# Patient Record
Sex: Male | Born: 1955 | Race: White | Hispanic: No | Marital: Married | Smoking: Former smoker
Health system: Southern US, Community
[De-identification: ages and names within clinical notes are randomized; demographics above are authoritative.]

## PROBLEM LIST (undated history)

## (undated) DIAGNOSIS — G4730 Sleep apnea, unspecified: Secondary | ICD-10-CM

## (undated) DIAGNOSIS — Z720 Tobacco use: Secondary | ICD-10-CM

## (undated) DIAGNOSIS — J61 Pneumoconiosis due to asbestos and other mineral fibers: Secondary | ICD-10-CM

## (undated) DIAGNOSIS — F419 Anxiety disorder, unspecified: Secondary | ICD-10-CM

## (undated) DIAGNOSIS — E785 Hyperlipidemia, unspecified: Secondary | ICD-10-CM

## (undated) DIAGNOSIS — M1990 Unspecified osteoarthritis, unspecified site: Secondary | ICD-10-CM

## (undated) DIAGNOSIS — K219 Gastro-esophageal reflux disease without esophagitis: Secondary | ICD-10-CM

## (undated) DIAGNOSIS — J45909 Unspecified asthma, uncomplicated: Secondary | ICD-10-CM

## (undated) DIAGNOSIS — E119 Type 2 diabetes mellitus without complications: Secondary | ICD-10-CM

## (undated) DIAGNOSIS — T7840XA Allergy, unspecified, initial encounter: Secondary | ICD-10-CM

## (undated) DIAGNOSIS — R200 Anesthesia of skin: Secondary | ICD-10-CM

## (undated) HISTORY — DX: Sleep apnea, unspecified: G47.30

## (undated) HISTORY — PX: VASECTOMY: SHX75

## (undated) HISTORY — DX: Pneumoconiosis due to asbestos and other mineral fibers: J61

## (undated) HISTORY — DX: Tobacco use: Z72.0

## (undated) HISTORY — PX: FOOT MASS EXCISION: SHX1663

## (undated) HISTORY — DX: Type 2 diabetes mellitus without complications: E11.9

## (undated) HISTORY — DX: Gastro-esophageal reflux disease without esophagitis: K21.9

## (undated) HISTORY — PX: SPINE SURGERY: SHX786

## (undated) HISTORY — DX: Allergy, unspecified, initial encounter: T78.40XA

## (undated) HISTORY — DX: Anxiety disorder, unspecified: F41.9

## (undated) HISTORY — DX: Hyperlipidemia, unspecified: E78.5

## (undated) HISTORY — DX: Unspecified asthma, uncomplicated: J45.909

## (undated) HISTORY — PX: EYE SURGERY: SHX253

---

## 2000-04-26 ENCOUNTER — Ambulatory Visit (HOSPITAL_BASED_OUTPATIENT_CLINIC_OR_DEPARTMENT_OTHER): Admission: RE | Admit: 2000-04-26 | Discharge: 2000-04-26 | Payer: Self-pay | Admitting: Family Medicine

## 2002-11-20 ENCOUNTER — Ambulatory Visit (HOSPITAL_COMMUNITY): Admission: RE | Admit: 2002-11-20 | Discharge: 2002-11-20 | Payer: Self-pay | Admitting: Family Medicine

## 2002-11-20 ENCOUNTER — Encounter: Payer: Self-pay | Admitting: Family Medicine

## 2004-10-11 ENCOUNTER — Emergency Department (HOSPITAL_COMMUNITY): Admission: EM | Admit: 2004-10-11 | Discharge: 2004-10-11 | Payer: Self-pay | Admitting: Emergency Medicine

## 2007-12-09 ENCOUNTER — Emergency Department (HOSPITAL_COMMUNITY): Admission: EM | Admit: 2007-12-09 | Discharge: 2007-12-09 | Payer: Self-pay | Admitting: Emergency Medicine

## 2010-12-11 LAB — URINALYSIS, ROUTINE W REFLEX MICROSCOPIC
Glucose, UA: NEGATIVE
Nitrite: NEGATIVE
pH: 5.5

## 2010-12-11 LAB — POCT I-STAT, CHEM 8
Glucose, Bld: 98
HCT: 46

## 2012-05-30 ENCOUNTER — Other Ambulatory Visit (INDEPENDENT_AMBULATORY_CARE_PROVIDER_SITE_OTHER): Payer: BC Managed Care – PPO

## 2012-05-30 ENCOUNTER — Other Ambulatory Visit: Payer: Self-pay | Admitting: Physician Assistant

## 2012-05-30 DIAGNOSIS — E1059 Type 1 diabetes mellitus with other circulatory complications: Secondary | ICD-10-CM

## 2012-05-30 DIAGNOSIS — R5381 Other malaise: Secondary | ICD-10-CM

## 2012-05-30 DIAGNOSIS — E559 Vitamin D deficiency, unspecified: Secondary | ICD-10-CM

## 2012-05-30 DIAGNOSIS — R5383 Other fatigue: Secondary | ICD-10-CM

## 2012-05-30 LAB — POCT GLYCOSYLATED HEMOGLOBIN (HGB A1C): Hemoglobin A1C: 9.5

## 2012-05-30 NOTE — Progress Notes (Signed)
Pt came in for labs only 

## 2012-05-31 LAB — CMP AND LIVER
ALT: 24 U/L (ref 0–53)
Albumin: 4.5 g/dL (ref 3.5–5.2)
Alkaline Phosphatase: 57 U/L (ref 39–117)
CO2: 26 mEq/L (ref 19–32)
Glucose, Bld: 191 mg/dL — ABNORMAL HIGH (ref 70–99)
Indirect Bilirubin: 0.5 mg/dL (ref 0.0–0.9)
Potassium: 4.7 mEq/L (ref 3.5–5.3)
Sodium: 139 mEq/L (ref 135–145)
Total Protein: 7.1 g/dL (ref 6.0–8.3)

## 2012-05-31 LAB — VITAMIN D 25 HYDROXY (VIT D DEFICIENCY, FRACTURES): Vit D, 25-Hydroxy: 25 ng/mL — ABNORMAL LOW (ref 30–89)

## 2012-06-03 ENCOUNTER — Encounter: Payer: Self-pay | Admitting: Physician Assistant

## 2012-06-03 ENCOUNTER — Ambulatory Visit (INDEPENDENT_AMBULATORY_CARE_PROVIDER_SITE_OTHER): Payer: BC Managed Care – PPO | Admitting: Physician Assistant

## 2012-06-03 VITALS — BP 126/82 | HR 82 | Temp 97.5°F | Ht 69.5 in | Wt 242.4 lb

## 2012-06-03 DIAGNOSIS — K219 Gastro-esophageal reflux disease without esophagitis: Secondary | ICD-10-CM | POA: Insufficient documentation

## 2012-06-03 DIAGNOSIS — Z7985 Long-term (current) use of injectable non-insulin antidiabetic drugs: Secondary | ICD-10-CM | POA: Insufficient documentation

## 2012-06-03 DIAGNOSIS — M542 Cervicalgia: Secondary | ICD-10-CM

## 2012-06-03 DIAGNOSIS — F411 Generalized anxiety disorder: Secondary | ICD-10-CM | POA: Insufficient documentation

## 2012-06-03 DIAGNOSIS — E119 Type 2 diabetes mellitus without complications: Secondary | ICD-10-CM | POA: Insufficient documentation

## 2012-06-03 MED ORDER — GABAPENTIN 100 MG PO CAPS: 100.0000 mg | ORAL_CAPSULE | Freq: Three times a day (TID) | ORAL | Status: DC | PRN

## 2012-06-03 MED ORDER — METFORMIN HCL 500 MG PO TABS: 1000.0000 mg | ORAL_TABLET | Freq: Two times a day (BID) | ORAL | Status: DC

## 2012-06-03 NOTE — Progress Notes (Signed)
  Subjective:    Patient ID: Scott [name], male    DOB: Jun 09, 1955, 57 y.o.   MRN: [id number]  HPI Comments: Has been to Dr Thalia Bloodgood pain management, last injection in neck was Dec 2013; Lots of bone spurs in neck  Diabetes He presents for his follow-up diabetic visit. He has type 2 diabetes mellitus. No MedicAlert identification noted. His disease course has been fluctuating. There are no hypoglycemic associated symptoms. There are no diabetic associated symptoms. There are no hypoglycemic complications. Risk factors for coronary artery disease include diabetes mellitus and dyslipidemia. He is compliant with treatment most of the time. His weight is increasing steadily. Diabetic current diet: chocalate temptations. An ACE inhibitor/angiotensin II receptor blocker is being taken.      Review of Systems  Musculoskeletal: Positive for back pain.       Neck and right trapezial muscle pain  All other systems reviewed and are negative.       Objective:   Physical Exam  Vitals reviewed. Constitutional: He is oriented to person, place, and time. He appears well-developed and well-nourished.  HENT:  Head: Normocephalic and atraumatic.  Right Ear: External ear normal.  Left Ear: External ear normal.  Nose: Nose normal.  Mouth/Throat: Oropharynx is clear and moist.  Eyes: Conjunctivae and EOM are normal. Pupils are equal, round, and reactive to light.  Neck: Normal range of motion. Neck supple.  Cardiovascular: Normal rate, regular rhythm and normal heart sounds.   Pulmonary/Chest: Effort normal and breath sounds normal.  Abdominal: Soft. Bowel sounds are normal.  Musculoskeletal:  Right trapezial muscle tightness  Neurological: He is oriented to person, place, and time.  Skin: Skin is warm and dry.  Psychiatric: He has a normal mood and affect. His behavior is normal. Judgment and thought content normal.          Assessment & Plan:  Diabetes - poor  control GERD Anxiety Cervicalgia No orders of the defined types were placed in this encounter.  labs done prior to visit A1C 9.5, up from 6.4 in Sept 2013 Meds ordered this encounter  Medications  . DISCONTD: gabapentin (NEURONTIN) 100 MG capsule    Sig: Take 200 mg by mouth at bedtime.   Marland Kitchen DISCONTD: metFORMIN (GLUCOPHAGE) 500 MG tablet    Sig: Take 500 mg by mouth 2 (two) times daily.  . metFORMIN (GLUCOPHAGE) 500 MG tablet    Sig: Take 2 tablets (1,000 mg total) by mouth 2 (two) times daily.    Dispense:  60 tablet    Refill:  5    Order Specific Question:  Supervising Provider    Answer:  Ernestina Penna [[id number]]  . gabapentin (NEURONTIN) 100 MG capsule    Sig: Take 1 capsule (100 mg total) by mouth 3 (three) times daily as needed.    Dispense:  90 capsule    Refill:  5    Order Specific Question:  Supervising Provider    Answer:  Ernestina Penna [[id number]]

## 2012-06-04 ENCOUNTER — Encounter: Payer: Self-pay | Admitting: *Deleted

## 2012-06-04 NOTE — Progress Notes (Signed)
Same goes for this duplicate message

## 2012-06-04 NOTE — Progress Notes (Signed)
These results were viewed days ago; discussed with pt. At in office visit 06/02/12

## 2012-06-05 ENCOUNTER — Encounter: Payer: Self-pay | Admitting: *Deleted

## 2012-06-05 ENCOUNTER — Telehealth: Payer: Self-pay | Admitting: *Deleted

## 2012-06-05 NOTE — Telephone Encounter (Signed)
Left message to please call back for lipid results.

## 2012-06-10 ENCOUNTER — Encounter: Payer: Self-pay | Admitting: Family Medicine

## 2012-06-10 NOTE — Telephone Encounter (Signed)
Letter mailed for labs.

## 2012-08-15 ENCOUNTER — Ambulatory Visit (INDEPENDENT_AMBULATORY_CARE_PROVIDER_SITE_OTHER): Payer: BC Managed Care – PPO | Admitting: General Practice

## 2012-08-15 VITALS — BP 148/80 | HR 86 | Temp 97.9°F | Ht 69.5 in | Wt 240.0 lb

## 2012-08-15 DIAGNOSIS — J322 Chronic ethmoidal sinusitis: Secondary | ICD-10-CM

## 2012-08-15 DIAGNOSIS — H8110 Benign paroxysmal vertigo, unspecified ear: Secondary | ICD-10-CM

## 2012-08-15 MED ORDER — AZITHROMYCIN 250 MG PO TABS: ORAL_TABLET | ORAL | Status: DC

## 2012-08-15 MED ORDER — MECLIZINE HCL 12.5 MG PO TABS: 12.5000 mg | ORAL_TABLET | Freq: Two times a day (BID) | ORAL | Status: DC | PRN

## 2012-08-15 NOTE — Progress Notes (Signed)
  Subjective:    Patient ID: Scott [name], male    DOB: [birth year], 57 y.o.   MRN: [id number]  HPI Presents today with dizziness and nasal congestion. Reports onset was Wednesday. He reports a history of vertigo. He reports these are the same symptoms that occur with his vertigo.    Review of Systems  Constitutional: Negative for fever and chills.  HENT: Positive for congestion and sinus pressure. Negative for sore throat and postnasal drip.   Respiratory: Negative for chest tightness and shortness of breath.   Cardiovascular: Negative for chest pain and palpitations.  Neurological: Positive for dizziness.  All other systems reviewed and are negative.       Objective:   Physical Exam  Constitutional: He is oriented to person, place, and time. He appears well-developed and well-nourished.  HENT:  Head: Normocephalic and atraumatic.  Right Ear: External ear normal.  Left Ear: External ear normal.  Nose: Right sinus exhibits maxillary sinus tenderness. Left sinus exhibits maxillary sinus tenderness.  Mouth/Throat: Oropharynx is clear and moist.  Cardiovascular: Normal rate, regular rhythm and normal heart sounds.   Pulmonary/Chest: Effort normal and breath sounds normal.  Neurological: He is alert and oriented to person, place, and time.  Skin: Skin is warm and dry.  Psychiatric: He has a normal mood and affect.          Assessment & Plan:  1. Benign paroxysmal positional vertigo - meclizine (ANTIVERT) 12.5 MG tablet; Take 1 tablet (12.5 mg total) by mouth 2 (two) times daily as needed for dizziness.  Dispense: 30 tablet; Refill: 2 -Sedation precaution discussed -refrain from sudden changes in position, move slow with position changes.  -rest  2. Ethmoid sinusitis - azithromycin (ZITHROMAX) 250 MG tablet; Take as directed  Dispense: 6 tablet; Refill: 0 -increase fluid intake -RTO if symptoms worsen or unresolved -Patient verbalized understanding -Coralie Keens,  FNP-C

## 2012-08-15 NOTE — Patient Instructions (Signed)
Vertigo Vertigo means you feel like you or your surroundings are moving when they are not. Vertigo can be dangerous if it occurs when you are at work, driving, or performing difficult activities.  CAUSES  Vertigo occurs when there is a conflict of signals sent to your brain from the visual and sensory systems in your body. There are many different causes of vertigo, including:  Infections, especially in the inner ear.  A bad reaction to a drug or misuse of alcohol and medicines.  Withdrawal from drugs or alcohol.  Rapidly changing positions, such as lying down or rolling over in bed.  A migraine headache.  Decreased blood flow to the brain.  Increased pressure in the brain from a head injury, infection, tumor, or bleeding. SYMPTOMS  You may feel as though the world is spinning around or you are falling to the ground. Because your balance is upset, vertigo can cause nausea and vomiting. You may have involuntary eye movements (nystagmus). DIAGNOSIS  Vertigo is usually diagnosed by physical exam. If the cause of your vertigo is unknown, your caregiver may perform imaging tests, such as an MRI scan (magnetic resonance imaging). TREATMENT  Most cases of vertigo resolve on their own, without treatment. Depending on the cause, your caregiver may prescribe certain medicines. If your vertigo is related to body position issues, your caregiver may recommend movements or procedures to correct the problem. In rare cases, if your vertigo is caused by certain inner ear problems, you may need surgery. HOME CARE INSTRUCTIONS   Follow your caregiver's instructions.  Avoid driving.  Avoid operating heavy machinery.  Avoid performing any tasks that would be dangerous to you or others during a vertigo episode.  Tell your caregiver if you notice that certain medicines seem to be causing your vertigo. Some of the medicines used to treat vertigo episodes can actually make them worse in some people. SEEK  IMMEDIATE MEDICAL CARE IF:   Your medicines do not relieve your vertigo or are making it worse.  You develop problems with talking, walking, weakness, or using your arms, hands, or legs.  You develop severe headaches.  Your nausea or vomiting continues or gets worse.  You develop visual changes.  A family member notices behavioral changes.  Your condition gets worse. MAKE SURE YOU:  Understand these instructions.  Will watch your condition.  Will get help right away if you are not doing well or get worse. Document Released: 12/06/2004 Document Revised: 05/21/2011 Document Reviewed: 09/14/2010 Exit[employer] Patient Information 2014 [employer].  

## 2012-10-03 ENCOUNTER — Ambulatory Visit (INDEPENDENT_AMBULATORY_CARE_PROVIDER_SITE_OTHER): Payer: BC Managed Care – PPO

## 2012-10-03 ENCOUNTER — Ambulatory Visit (INDEPENDENT_AMBULATORY_CARE_PROVIDER_SITE_OTHER): Payer: BC Managed Care – PPO | Admitting: General Practice

## 2012-10-03 VITALS — BP 129/78 | HR 84 | Temp 97.9°F | Ht 69.5 in | Wt 240.0 lb

## 2012-10-03 DIAGNOSIS — R059 Cough, unspecified: Secondary | ICD-10-CM

## 2012-10-03 DIAGNOSIS — R0602 Shortness of breath: Secondary | ICD-10-CM

## 2012-10-03 DIAGNOSIS — J209 Acute bronchitis, unspecified: Secondary | ICD-10-CM

## 2012-10-03 DIAGNOSIS — R05 Cough: Secondary | ICD-10-CM

## 2012-10-03 MED ORDER — BENZONATATE 100 MG PO CAPS: 100.0000 mg | ORAL_CAPSULE | Freq: Two times a day (BID) | ORAL | Status: DC | PRN

## 2012-10-03 MED ORDER — METHYLPREDNISOLONE ACETATE 80 MG/ML IJ SUSP
80.0000 mg | Freq: Once | INTRAMUSCULAR | Status: AC
  Administered 2012-10-03: 80 mg via INTRAMUSCULAR

## 2012-10-03 MED ORDER — PREDNISONE 50 MG PO TABS: ORAL_TABLET | ORAL | Status: DC

## 2012-10-03 MED ORDER — ALBUTEROL SULFATE (2.5 MG/3ML) 0.083% IN NEBU
2.5000 mg | INHALATION_SOLUTION | Freq: Once | RESPIRATORY_TRACT | Status: AC
  Administered 2012-10-03: 2.5 mg via RESPIRATORY_TRACT

## 2012-10-03 MED ORDER — ALBUTEROL SULFATE HFA 108 (90 BASE) MCG/ACT IN AERS: 2.0000 | INHALATION_SPRAY | Freq: Four times a day (QID) | RESPIRATORY_TRACT | Status: DC | PRN

## 2012-10-03 NOTE — Patient Instructions (Addendum)

## 2012-10-03 NOTE — Progress Notes (Signed)
  Subjective:    Patient ID: Scott [name], male    DOB: March 22, 1955, 57 y.o.   MRN: [id number]  HPI Patient presents today with shortness of breath and coughing. He reports the onset was Tuesday with sore throat which has resolved. He reports having a headache with the coughing. Reports coughing up clear secretions. He reports having a history of asbestos, while working at [employer]. Reports currently smoking two packs of cigarettes daily. Reports taking OTC cough medication, with no relief.     Review of Systems  Constitutional: Negative for fever and chills.  Respiratory: Positive for cough, shortness of breath and wheezing. Negative for chest tightness.   Cardiovascular: Negative for chest pain and palpitations.  Neurological: Positive for headaches. Negative for dizziness and weakness.       Objective:   Physical Exam  Constitutional: He is oriented to person, place, and time. He appears well-developed and well-nourished.  HENT:  Head: Normocephalic and atraumatic.  Right Ear: External ear normal.  Left Ear: External ear normal.  Nose: Nose normal.  Mouth/Throat: Oropharynx is clear and moist.  Eyes: EOM are normal. Pupils are equal, round, and reactive to light.  Neck: Normal range of motion. Neck supple. No thyromegaly present.  Cardiovascular: Normal rate, regular rhythm and normal heart sounds.   Pulmonary/Chest: He has decreased breath sounds. He has wheezes in the right upper field, the right middle field, the left upper field and the left lower field. He exhibits no tenderness.  Lymphadenopathy:    He has no cervical adenopathy.  Neurological: He is alert and oriented to person, place, and time.  Skin: Skin is warm and dry.  Psychiatric: He has a normal mood and affect.   [facility] reading (PRIMARY) by Coralie Keens, FNP-C, bronchial inflammation noted.                               Assessment & Plan:  1. Shortness of breath - DG Chest 2 View; Future -  albuterol (PROVENTIL) (2.5 MG/3ML) 0.083% nebulizer solution 2.5 mg; Take 3 mLs (2.5 mg total) by nebulization once.  2. Acute bronchitis - methylPREDNISolone acetate (DEPO-MEDROL) injection 80 mg; Inject 1 mL (80 mg total) into the muscle once. - predniSONE (DELTASONE) 50 MG tablet; Take one tablet daily for 5 days  Dispense: 5 tablet; Refill: 0 - albuterol (PROVENTIL HFA;VENTOLIN HFA) 108 (90 BASE) MCG/ACT inhaler; Inhale 2 puffs into the lungs every 6 (six) hours as needed for wheezing.  Dispense: 1 Inhaler; Refill: 3  3. Cough - benzonatate (TESSALON) 100 MG capsule; Take 1 capsule (100 mg total) by mouth 2 (two) times daily as needed for cough.  Dispense: 20 capsule; Refill: 0 -Increase fluid intake -take medications as directed -increase fluids (water) -RTO if symptoms worsen or unresolved (or seek emergency medical attention -Patient verbalized understanding -Coralie Keens, FNP-C

## 2012-10-07 ENCOUNTER — Ambulatory Visit: Payer: BC Managed Care – PPO | Admitting: General Practice

## 2012-12-04 ENCOUNTER — Ambulatory Visit (INDEPENDENT_AMBULATORY_CARE_PROVIDER_SITE_OTHER): Payer: BC Managed Care – PPO | Admitting: Family Medicine

## 2012-12-04 ENCOUNTER — Other Ambulatory Visit: Payer: Self-pay | Admitting: *Deleted

## 2012-12-04 ENCOUNTER — Encounter: Payer: Self-pay | Admitting: Family Medicine

## 2012-12-04 VITALS — BP 148/82 | HR 79 | Temp 97.9°F | Ht 69.5 in | Wt 241.0 lb

## 2012-12-04 DIAGNOSIS — Z8709 Personal history of other diseases of the respiratory system: Secondary | ICD-10-CM

## 2012-12-04 DIAGNOSIS — G5693 Unspecified mononeuropathy of bilateral upper limbs: Secondary | ICD-10-CM | POA: Insufficient documentation

## 2012-12-04 DIAGNOSIS — IMO0001 Reserved for inherently not codable concepts without codable children: Secondary | ICD-10-CM | POA: Insufficient documentation

## 2012-12-04 DIAGNOSIS — R5383 Other fatigue: Secondary | ICD-10-CM

## 2012-12-04 DIAGNOSIS — E119 Type 2 diabetes mellitus without complications: Secondary | ICD-10-CM

## 2012-12-04 DIAGNOSIS — N400 Enlarged prostate without lower urinary tract symptoms: Secondary | ICD-10-CM

## 2012-12-04 DIAGNOSIS — E559 Vitamin D deficiency, unspecified: Secondary | ICD-10-CM

## 2012-12-04 DIAGNOSIS — N529 Male erectile dysfunction, unspecified: Secondary | ICD-10-CM

## 2012-12-04 DIAGNOSIS — K219 Gastro-esophageal reflux disease without esophagitis: Secondary | ICD-10-CM

## 2012-12-04 DIAGNOSIS — R5381 Other malaise: Secondary | ICD-10-CM

## 2012-12-04 DIAGNOSIS — M47812 Spondylosis without myelopathy or radiculopathy, cervical region: Secondary | ICD-10-CM

## 2012-12-04 DIAGNOSIS — R030 Elevated blood-pressure reading, without diagnosis of hypertension: Secondary | ICD-10-CM

## 2012-12-04 DIAGNOSIS — G609 Hereditary and idiopathic neuropathy, unspecified: Secondary | ICD-10-CM

## 2012-12-04 DIAGNOSIS — F411 Generalized anxiety disorder: Secondary | ICD-10-CM

## 2012-12-04 LAB — POCT UA - MICROSCOPIC ONLY
Bacteria, U Microscopic: NEGATIVE
Casts, Ur, LPF, POC: NEGATIVE
RBC, urine, microscopic: NEGATIVE
Yeast, UA: NEGATIVE

## 2012-12-04 LAB — POCT URINALYSIS DIPSTICK
Bilirubin, UA: NEGATIVE
Blood, UA: NEGATIVE
Glucose, UA: NEGATIVE
Leukocytes, UA: NEGATIVE
Nitrite, UA: NEGATIVE
Protein, UA: NEGATIVE

## 2012-12-04 LAB — POCT CBC
Granulocyte percent: 55.8 %G (ref 37–80)
HCT, POC: 45.9 % (ref 43.5–53.7)
MCV: 93.7 fL (ref 80–97)
POC Granulocyte: 3.7 (ref 2–6.9)
RBC: 4.9 M/uL (ref 4.69–6.13)

## 2012-12-04 LAB — POCT GLYCOSYLATED HEMOGLOBIN (HGB A1C): Hemoglobin A1C: 7.7

## 2012-12-04 MED ORDER — LISINOPRIL 5 MG PO TABS: 5.0000 mg | ORAL_TABLET | Freq: Every day | ORAL | Status: DC

## 2012-12-04 MED ORDER — METFORMIN HCL 500 MG PO TABS: 1000.0000 mg | ORAL_TABLET | Freq: Two times a day (BID) | ORAL | Status: DC

## 2012-12-04 NOTE — Patient Instructions (Addendum)
Continue current medications. Continue good therapeutic lifestyle changes.  Fall precautions discussed with patient. Schedule your flu vaccine the first of October. Follow up as planned and earlier as needed. Check blood pressures and blood sugars more frequently, bring readings by in 4 weeks for review  We will schedule your exercise treadmill test He will schedule your colonoscopy Return  FOBT Schedule exam for eyes Stop smoking

## 2012-12-04 NOTE — Progress Notes (Signed)
Subjective:    Patient ID: Scott [name], male    DOB: [birth year], 57 y.o.   MRN: [id number]  HPI Pt here for follow up and management of chronic medical problems.  Patient Active Problem List   Diagnosis Date Noted  . GERD (gastroesophageal reflux disease) 06/03/2012  . Diabetes 06/03/2012  . Generalized anxiety disorder 06/03/2012   Outpatient Encounter Prescriptions as of 12/04/2012  Medication Sig Dispense Refill  . albuterol (PROVENTIL HFA;VENTOLIN HFA) 108 (90 BASE) MCG/ACT inhaler Inhale 2 puffs into the lungs every 6 (six) hours as needed for wheezing.  1 Inhaler  3  . gabapentin (NEURONTIN) 100 MG capsule Take 1 capsule (100 mg total) by mouth 3 (three) times daily as needed.  90 capsule  5  . meclizine (ANTIVERT) 12.5 MG tablet Take 1 tablet (12.5 mg total) by mouth 2 (two) times daily as needed for dizziness.  30 tablet  2  . metFORMIN (GLUCOPHAGE) 500 MG tablet Take 2 tablets (1,000 mg total) by mouth 2 (two) times daily.  60 tablet  5  . [DISCONTINUED] benzonatate (TESSALON) 100 MG capsule Take 1 capsule (100 mg total) by mouth 2 (two) times daily as needed for cough.  20 capsule  0  . [DISCONTINUED] predniSONE (DELTASONE) 50 MG tablet Take one tablet daily for 5 days. Start on 10/04/12  5 tablet  0   No facility-administered encounter medications on file as of 12/04/2012.       Review of Systems  Constitutional: Negative.   HENT: Negative.   Eyes: Negative.   Respiratory: Negative.   Cardiovascular: Negative.   Gastrointestinal: Negative.   Endocrine: Negative.   Genitourinary: Negative.   Musculoskeletal: Negative.   Skin: Negative.   Allergic/Immunologic: Negative.   Neurological: Negative.   Hematological: Negative.   Psychiatric/Behavioral: Negative.        Objective:   Physical Exam  Nursing note and vitals reviewed. Constitutional: He is oriented to person, place, and time. He appears well-developed and well-nourished. No distress.  HENT:  Head:  Normocephalic and atraumatic.  Right Ear: External ear normal.  Left Ear: External ear normal.  Nose: Nose normal.  Mouth/Throat: Oropharynx is clear and moist. No oropharyngeal exudate.  Eyes: Conjunctivae and EOM are normal. Pupils are equal, round, and reactive to light. Right eye exhibits no discharge. Left eye exhibits no discharge. No scleral icterus.  Neck: Normal range of motion. Neck supple. No tracheal deviation present. No thyromegaly present.  Cardiovascular: Normal rate, regular rhythm, normal heart sounds and intact distal pulses.  Exam reveals no gallop and no friction rub.   No murmur heard. At 72 per minute  Pulmonary/Chest: Effort normal and breath sounds normal. No respiratory distress. He has no wheezes. He has no rales.  Abdominal: Soft. Bowel sounds are normal. He exhibits no mass. There is no tenderness. There is no rebound and no guarding.  Obesity  Genitourinary: Rectum normal and penis normal.  Prostate is enlarged. No testicular masses. No rectal masses.  Musculoskeletal: Normal range of motion. He exhibits no edema and no tenderness.  Lymphadenopathy:    He has no cervical adenopathy.  Neurological: He is alert and oriented to person, place, and time. He has normal reflexes. No cranial nerve deficit.  Skin: Skin is warm and dry. No rash noted. No erythema. No pallor.  Nail fungus bilaterally feet  Psychiatric: He has a normal mood and affect. His behavior is normal. Judgment and thought content normal.   BP 148/82  Pulse 79  Temp(Src)  97.9 F (36.6 C) (Oral)  Ht 5' 9.5" (1.765 m)  Wt 241 lb (109.317 kg)  BMI 35.09 kg/m2  EKG: {ekg findings:315101: EKG is normal                                         Assessment & Plan:  1. GERD (gastroesophageal reflux disease) - POCT CBC  2. Diabetes - POCT CBC - NMR, lipoprofile - POCT glycosylated hemoglobin (Hb A1C) - lisinopril (PRINIVIL,ZESTRIL) 5 MG tablet; Take 1 tablet (5 mg total) by mouth daily.   Dispense: 90 tablet; Refill: 3  3. Generalized anxiety disorder - POCT CBC  4. Vitamin D deficiency - Vit D  25 hydroxy (rtn osteoporosis monitoring)  5. Other malaise and fatigue - POCT CBC - Hepatic function panel - BMP8+EGFR - PSA, total and free  6. Elevated blood pressure  7. H/O asbestosis  8. Erectile dysfunction  9. Neuropathy of both upper extremities  10. Degenerative arthritis of cervical spine  11. BPH (benign prostatic hyperplasia)  Patient Instructions  Continue current medications. Continue good therapeutic lifestyle changes.  Fall precautions discussed with patient. Schedule your flu vaccine the first of October. Follow up as planned and earlier as needed. Check blood pressures and blood sugars more frequently, bring readings by in 4 weeks for review  We will schedule your exercise treadmill test He will schedule your colonoscopy Return  FOBT Schedule exam for eyes Stop smoking    Nyra Capes MD

## 2012-12-06 LAB — HEPATIC FUNCTION PANEL
AST: 12 IU/L (ref 0–40)
Albumin: 4.5 g/dL (ref 3.5–5.5)
Alkaline Phosphatase: 67 IU/L (ref 39–117)
Total Bilirubin: 0.4 mg/dL (ref 0.0–1.2)
Total Protein: 6.9 g/dL (ref 6.0–8.5)

## 2012-12-06 LAB — NMR, LIPOPROFILE
Cholesterol: 168 mg/dL (ref <200)
HDL Cholesterol by NMR: 43 mg/dL (ref >=40)
LDL Particle Number: 1703 nmol/L — ABNORMAL HIGH (ref <1000)
LDL Size: 20.1 nm — ABNORMAL LOW (ref >20.5)
Small LDL Particle Number: 1181 nmol/L — ABNORMAL HIGH (ref <=527)
Triglycerides by NMR: 87 mg/dL (ref <150)

## 2012-12-06 LAB — BMP8+EGFR
BUN/Creatinine Ratio: 16 (ref 9–20)
CO2: 23 mmol/L (ref 18–29)
Creatinine, Ser: 0.75 mg/dL — ABNORMAL LOW (ref 0.76–1.27)
GFR calc non Af Amer: 102 mL/min/{1.73_m2} (ref >59)
Sodium: 141 mmol/L (ref 134–144)

## 2012-12-06 LAB — VITAMIN D 25 HYDROXY (VIT D DEFICIENCY, FRACTURES): Vit D, 25-Hydroxy: 27.7 ng/mL — ABNORMAL LOW (ref 30.0–100.0)

## 2012-12-06 LAB — TESTOSTERONE,FREE AND TOTAL: Testosterone: 275 ng/dL — ABNORMAL LOW (ref 348–1197)

## 2012-12-06 LAB — PSA, TOTAL AND FREE: PSA, Free Pct: 23.3 %

## 2012-12-13 ENCOUNTER — Ambulatory Visit (INDEPENDENT_AMBULATORY_CARE_PROVIDER_SITE_OTHER): Payer: BC Managed Care – PPO | Admitting: Family Medicine

## 2012-12-13 ENCOUNTER — Emergency Department (HOSPITAL_COMMUNITY): Payer: BC Managed Care – PPO

## 2012-12-13 ENCOUNTER — Emergency Department (HOSPITAL_COMMUNITY)
Admission: EM | Admit: 2012-12-13 | Discharge: 2012-12-13 | Disposition: A | Discharge status: Home / Self Care | Payer: BC Managed Care – PPO | Attending: Emergency Medicine | Admitting: Emergency Medicine

## 2012-12-13 ENCOUNTER — Encounter (HOSPITAL_COMMUNITY): Payer: Self-pay | Admitting: *Deleted

## 2012-12-13 VITALS — BP 130/79 | HR 75 | Temp 97.6°F | Wt 240.6 lb

## 2012-12-13 DIAGNOSIS — G5693 Unspecified mononeuropathy of bilateral upper limbs: Secondary | ICD-10-CM

## 2012-12-13 DIAGNOSIS — M47812 Spondylosis without myelopathy or radiculopathy, cervical region: Secondary | ICD-10-CM

## 2012-12-13 DIAGNOSIS — F17200 Nicotine dependence, unspecified, uncomplicated: Secondary | ICD-10-CM | POA: Insufficient documentation

## 2012-12-13 DIAGNOSIS — IMO0001 Reserved for inherently not codable concepts without codable children: Secondary | ICD-10-CM

## 2012-12-13 DIAGNOSIS — F411 Generalized anxiety disorder: Secondary | ICD-10-CM

## 2012-12-13 DIAGNOSIS — E119 Type 2 diabetes mellitus without complications: Secondary | ICD-10-CM

## 2012-12-13 DIAGNOSIS — Z8719 Personal history of other diseases of the digestive system: Secondary | ICD-10-CM | POA: Insufficient documentation

## 2012-12-13 DIAGNOSIS — G609 Hereditary and idiopathic neuropathy, unspecified: Secondary | ICD-10-CM

## 2012-12-13 DIAGNOSIS — M549 Dorsalgia, unspecified: Secondary | ICD-10-CM | POA: Insufficient documentation

## 2012-12-13 DIAGNOSIS — R030 Elevated blood-pressure reading, without diagnosis of hypertension: Secondary | ICD-10-CM

## 2012-12-13 DIAGNOSIS — Z79899 Other long term (current) drug therapy: Secondary | ICD-10-CM | POA: Insufficient documentation

## 2012-12-13 LAB — COMPREHENSIVE METABOLIC PANEL
ALT: 20 U/L (ref 0–53)
Albumin: 4.2 g/dL (ref 3.5–5.2)
BUN: 11 mg/dL (ref 6–23)
CO2: 27 mEq/L (ref 19–32)
Calcium: 9.8 mg/dL (ref 8.4–10.5)
Chloride: 102 mEq/L (ref 96–112)
Creatinine, Ser: 0.75 mg/dL (ref 0.50–1.35)
GFR calc non Af Amer: >90 mL/min (ref >90)
Sodium: 138 mEq/L (ref 135–145)
Total Bilirubin: 0.5 mg/dL (ref 0.3–1.2)

## 2012-12-13 LAB — BASIC METABOLIC PANEL
BUN: 11 mg/dL (ref 6–23)
Calcium: 9.6 mg/dL (ref 8.4–10.5)
Chloride: 100 mEq/L (ref 96–112)
Creatinine, Ser: 0.77 mg/dL (ref 0.50–1.35)
GFR calc Af Amer: >90 mL/min (ref >90)
GFR calc non Af Amer: >90 mL/min (ref >90)

## 2012-12-13 LAB — CBC WITH DIFFERENTIAL/PLATELET
Basophils Absolute: 0.1 10*3/uL (ref 0.0–0.1)
Basophils Relative: 1 % (ref 0–1)
Eosinophils Absolute: 0.2 10*3/uL (ref 0.0–0.7)
Eosinophils Relative: 2 % (ref 0–5)
HCT: 45.5 % (ref 39.0–52.0)
Hemoglobin: 16.2 g/dL (ref 13.0–17.0)
Lymphocytes Relative: 33 % (ref 12–46)
MCH: 33.3 pg (ref 26.0–34.0)
MCHC: 35.6 g/dL (ref 30.0–36.0)
MCV: 93.4 fL (ref 78.0–100.0)
Monocytes Absolute: 0.6 10*3/uL (ref 0.1–1.0)
Monocytes Relative: 7 % (ref 3–12)
Platelets: 224 10*3/uL (ref 150–400)
RDW: 12.2 % (ref 11.5–15.5)

## 2012-12-13 LAB — URINALYSIS, ROUTINE W REFLEX MICROSCOPIC
Bilirubin Urine: NEGATIVE
Ketones, ur: NEGATIVE mg/dL
Nitrite: NEGATIVE
Protein, ur: NEGATIVE mg/dL
Specific Gravity, Urine: 1.02 (ref 1.005–1.030)
Urobilinogen, UA: 0.2 mg/dL (ref 0.0–1.0)
pH: 7 (ref 5.0–8.0)

## 2012-12-13 LAB — LIPASE, BLOOD: Lipase: 28 U/L (ref 11–59)

## 2012-12-13 MED ORDER — METHYLPREDNISOLONE 4 MG PO KIT: PACK | ORAL | Status: DC

## 2012-12-13 MED ORDER — DIAZEPAM 5 MG PO TABS: 5.0000 mg | ORAL_TABLET | Freq: Three times a day (TID) | ORAL | Status: DC | PRN

## 2012-12-13 MED ORDER — METHOCARBAMOL 500 MG PO TABS: 500.0000 mg | ORAL_TABLET | Freq: Two times a day (BID) | ORAL | Status: DC

## 2012-12-13 NOTE — Progress Notes (Signed)
Patient ID: Scott [name], male   DOB: Sep 19, 1955, 57 y.o.   MRN: [id number] SUBJECTIVE: CC: Chief Complaint  Patient presents with  . Acute Visit    back pain  started Tues. Saw Dr Christell Constant for CPX 12-04-12     HPI: Was seen recently, poorly controlled Diabetic with risk factors. Smokes 1 1/2 PPD of  Cigarettes.HTN.  middle of the back has an ache inside and he doesn't feel right. No dizziness. Denies chest pain. He was recently started on Lisinopril here at [facility]. And after that he has experienced this unusual pain. He had the flu shot after it started. Poor dietary habits as well. Woke up this am and drank 2 cups of coffee and  Did not have a good feeling about his ache in his back so he came over the clinic to see if we could evaluate it for him. No relieving or aggravating factor except that he notices it more when he stands from a sitting position. No radiation of the pain   Past Medical History  Diagnosis Date  . GERD (gastroesophageal reflux disease)   . Anxiety   . Diabetes mellitus without complication   . Sleep apnea    Past Surgical History  Procedure Laterality Date  . Foot mass excision    . Vasectomy     History   Social History  . Marital Status: Married    Spouse Name: N/A    Number of Children: N/A  . Years of Education: N/A   Occupational History  . Not on file.   Social History Main Topics  . Smoking status: Current Every Day Smoker -- 1.50 packs/day    Types: Cigarettes  . Smokeless tobacco: Not on file  . Alcohol Use: Yes     Comment: social  . Drug Use: No  . Sexual Activity: Not on file   Other Topics Concern  . Not on file   Social History Narrative  . No narrative on file   Family History  Problem Relation Age of Onset  . Stroke Father    Current Outpatient Prescriptions on File Prior to Visit  Medication Sig Dispense Refill  . albuterol (PROVENTIL HFA;VENTOLIN HFA) 108 (90 BASE) MCG/ACT inhaler Inhale 2 puffs into the lungs every  6 (six) hours as needed for wheezing.  1 Inhaler  3  . gabapentin (NEURONTIN) 100 MG capsule Take 1 capsule (100 mg total) by mouth 3 (three) times daily as needed.  90 capsule  5  . lisinopril (PRINIVIL,ZESTRIL) 5 MG tablet Take 1 tablet (5 mg total) by mouth daily.  90 tablet  3  . metFORMIN (GLUCOPHAGE) 500 MG tablet Take 2 tablets (1,000 mg total) by mouth 2 (two) times daily.  120 tablet  12  . meclizine (ANTIVERT) 12.5 MG tablet Take 1 tablet (12.5 mg total) by mouth 2 (two) times daily as needed for dizziness.  30 tablet  2   No current facility-administered medications on file prior to visit.   Allergies  Allergen Reactions  . Augmentin [Amoxicillin-Pot Clavulanate]   . Codeine   . Diclofenac    Immunization History  Administered Date(s) Administered  . Pneumococcal Polysaccharide 03/12/2009  . Zoster 02/22/2012   Prior to Admission medications   Medication Sig Start Date End Date Taking? Authorizing Provider  albuterol (PROVENTIL HFA;VENTOLIN HFA) 108 (90 BASE) MCG/ACT inhaler Inhale 2 puffs into the lungs every 6 (six) hours as needed for wheezing. 10/03/12  Yes Coralie Keens, FNP  gabapentin (NEURONTIN) 100  MG capsule Take 1 capsule (100 mg total) by mouth 3 (three) times daily as needed. 06/03/12  Yes Horald Pollen, PA-C  lisinopril (PRINIVIL,ZESTRIL) 5 MG tablet Take 1 tablet (5 mg total) by mouth daily. 12/04/12  Yes Ernestina Penna, MD  metFORMIN (GLUCOPHAGE) 500 MG tablet Take 2 tablets (1,000 mg total) by mouth 2 (two) times daily. 12/04/12  Yes Ernestina Penna, MD  meclizine (ANTIVERT) 12.5 MG tablet Take 1 tablet (12.5 mg total) by mouth 2 (two) times daily as needed for dizziness. 08/15/12   Coralie Keens, FNP      ROS: As above in the HPI. All other systems are stable or negative.  OBJECTIVE: APPEARANCE:  Patient in no acute distress.The patient appeared well nourished and normally developed. Acyanotic. Waist:47.25 inches VITAL SIGNS:BP 130/79  Pulse 75   Temp(Src) 97.6 F (36.4 C) (Oral)  Wt 240 lb 9.6 oz (109.135 kg)  BMI 35.03 kg/m2  WM smells of tobacco sm ambulatory in NADoke. Recheck BP 180/100  SKIN: warm and  Dry without overt rashes, tattoos and scars  HEAD and Neck: without JVD, Head and scalp: normal Eyes:No scleral icterus. Fundi normal, eye movements normal. Ears: Auricle normal, canal normal, Tympanic membranes normal, insufflation normal. Nose: normal Throat: normal Neck & thyroid: normal  CHEST & LUNGS: Chest wall: normal Lungs: Clear  CVS: Reveals the PMI to be normally located. Regular rhythm, First and Second Heart sounds are normal,  absence of murmurs, rubs or gallops. Peripheral vasculature: Radial pulses: normal Dorsal pedis pulses: normal Posterior pulses: normal  ABDOMEN:  Appearance: obese Benign, no organomegaly, no masses, no Abdominal Aortic enlargement. No Guarding , no rebound. No Bruits. Bowel sounds: normal No abdominal bruits.  RECTAL: N/A GU: N/A  EXTREMETIES: nonedematous.  MUSCULOSKELETAL:  Spine: normal Full ROM . Pain not reproduced with movement Joints: intact  NEUROLOGIC: oriented to time,place and person; nonfocal. Strength is normal Sensory is normal Reflexes are normal Cranial Nerves are normal.  ASSESSMENT: Back pain, acute - atypical mid back ache etiology to be determined.  Degenerative arthritis of cervical spine  Diabetes  Elevated blood pressure  Generalized anxiety disorder  Neuropathy of both upper extremities   PLAN: Needs to be further evaluated.  Due to the subjective feelings by patient of not feeling well and not feeling that this is right, and with limited ancillaries this Saturday will refer to the ED for further evaluation.  ED nurse informed.  Patient stable to go by private vehicle.  Return if symptoms worsen or fail to improve.  After ED evaluation and Discharge.  Chrissi Crow P. [place] Charon, M.D.

## 2012-12-13 NOTE — ED Provider Notes (Signed)
CSN: [id number]     Arrival date & time 12/13/12  1053 History  This chart was scribed for Scott Marion, MD by Quintella Reichert, ED scribe.  This patient was seen in room APA05/APA05 and the patient's care was started at 11:34 AM.  Chief Complaint  Patient presents with  . Back Pain    The history is provided by the patient. No language interpreter was used.    HPI Comments: Scott [name] is a 57 y.o. male with h/o DM who presents to the Emergency Department complaining of back pain that began 4-5 days ago and has been worsening over the past 2 days.  Pain began as an intermittent mild dull ache to the right side of his mid/upper back.  It has since grown more severe and constant and today has also moved to the left side.  He states "it feels like an internal ache" and feels distinct from prior musculoskeletal back pain he has experienced.  Pain does not radiate into legs, abdomen, chest, or groin.  It is not exacerbated by movement, deep breathing or walking.  He denies headache, sore throat, CP, SOB, cough, or any other associated symptoms.  Pt denies prior h/o kidney stones.  However he does note a prior h/o pain in that area and was told that his "kidneys were overloaded" and placed on Metformin.  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    PCP is Dr. Christell Constant   Past Medical History  Diagnosis Date  . GERD (gastroesophageal reflux disease)   . Anxiety   . Diabetes mellitus without complication   . Sleep apnea     Past Surgical History  Procedure Laterality Date  . Foot mass excision    . Vasectomy      Family History  Problem Relation Age of Onset  . Stroke Father     History  Substance Use Topics  . Smoking status: Current Every Day Smoker -- 1.50 packs/day    Types: Cigarettes  . Smokeless tobacco: Not on file  . Alcohol Use: Yes     Comment: social     Review of Systems  Constitutional: Negative for  fever, chills, diaphoresis, appetite change and fatigue.  HENT: Negative for sore throat, mouth sores and trouble swallowing.   Eyes: Negative for visual disturbance.  Respiratory: Negative for cough, chest tightness, shortness of breath and wheezing.   Cardiovascular: Negative for chest pain.  Gastrointestinal: Negative for nausea, vomiting, abdominal pain, diarrhea and abdominal distention.  Endocrine: Negative for polydipsia, polyphagia and polyuria.  Genitourinary: Negative for dysuria, frequency and hematuria.  Musculoskeletal: Positive for back pain. Negative for gait problem.  Skin: Negative for color change, pallor and rash.  Neurological: Negative for dizziness, syncope, light-headedness and headaches.  Hematological: Does not bruise/bleed easily.  Psychiatric/Behavioral: Negative for behavioral problems and confusion.     Allergies  Augmentin and Codeine  Home Medications   Current Outpatient Rx  Name  Route  Sig  Dispense  Refill  . albuterol (PROVENTIL HFA;VENTOLIN HFA) 108 (90 BASE) MCG/ACT inhaler   Inhalation   Inhale 2 puffs into the lungs every 6 (six) hours as needed for wheezing.   1 Inhaler   3   . gabapentin (NEURONTIN) 100 MG capsule   Oral   Take 1 capsule (100 mg total) by mouth 3 (three) times daily as needed.   90 capsule   5   . lisinopril (  PRINIVIL,ZESTRIL) 5 MG tablet   Oral   Take 1 tablet (5 mg total) by mouth daily.   90 tablet   3   . metFORMIN (GLUCOPHAGE) 500 MG tablet   Oral   Take 500 mg by mouth 2 (two) times daily with a meal.         . naproxen sodium (ANAPROX) 220 MG tablet   Oral   Take 440 mg by mouth 2 (two) times daily as needed.         . Vitamin D, Ergocalciferol, (DRISDOL) 50000 UNITS CAPS capsule   Oral   Take 50,000 Units by mouth every 7 (seven) days.          . diazepam (VALIUM) 5 MG tablet   Oral   Take 1 tablet (5 mg total) by mouth every 8 (eight) hours as needed for anxiety (muscle spasm).   10  tablet   0   . methocarbamol (ROBAXIN) 500 MG tablet   Oral   Take 1 tablet (500 mg total) by mouth 2 (two) times daily.   20 tablet   0   . methylPREDNISolone (MEDROL DOSEPAK) 4 MG tablet      6po on day 1, decrease by 1 tab per day   21 tablet   0    BP 143/86  Pulse 71  Temp(Src) 98 F (36.7 C) (Oral)  Resp 16  Ht 5\' 11"  (1.803 m)  Wt 240 lb (108.863 kg)  BMI 33.49 kg/m2  SpO2 100%  Physical Exam  Nursing note and vitals reviewed. Constitutional: He is oriented to person, place, and time. He appears well-developed and well-nourished. No distress.  HENT:  Head: Normocephalic.  Mouth/Throat: Oropharynx is clear and moist. No oropharyngeal exudate.  Moist mucous membranes  Eyes: Conjunctivae are normal. Pupils are equal, round, and reactive to light. No scleral icterus.  Neck: Normal range of motion. Neck supple. No thyromegaly present.  Cardiovascular: Normal rate, regular rhythm and normal heart sounds.  Exam reveals no gallop and no friction rub.   No murmur heard. Pulmonary/Chest: Effort normal and breath sounds normal. No respiratory distress. He has no wheezes. He has no rales.  Abdominal: Soft. Bowel sounds are normal. He exhibits no distension. There is no tenderness. There is no rebound.  Obese  Musculoskeletal: Normal range of motion. He exhibits tenderness.  Tenderness to paraspinal muscles of lower thoracic/upper lumbar bilaterally  Neurological: He is alert and oriented to person, place, and time.  Skin: Skin is warm and dry. No rash noted.  Psychiatric: He has a normal mood and affect. His behavior is normal.    ED Course  Procedures (including critical care time)  DIAGNOSTIC STUDIES: Oxygen Saturation is 100% on room air, normal by my interpretation.    COORDINATION OF CARE: 11:42 AM-Discussed treatment plan which includes labs and imaging with pt at bedside and pt agreed to plan.    Labs Review Labs Reviewed  BASIC METABOLIC PANEL - Abnormal;  Notable for the following:    Glucose, Bld 147 (*)    All other components within normal limits  URINALYSIS, ROUTINE W REFLEX MICROSCOPIC - Abnormal; Notable for the following:    Glucose, UA 100 (*)    All other components within normal limits  COMPREHENSIVE METABOLIC PANEL - Abnormal; Notable for the following:    Glucose, Bld 149 (*)    All other components within normal limits  CBC WITH DIFFERENTIAL  LIPASE, BLOOD    Imaging Review Ct Abdomen Pelvis Wo Contrast  12/13/2012   CLINICAL DATA:  Back pain and microhematuria  EXAM: CT ABDOMEN AND PELVIS WITHOUT CONTRAST  TECHNIQUE: Multidetector CT imaging of the abdomen and pelvis was performed following the standard protocol without intravenous contrast.  COMPARISON:  CT 12/09/2007.  FINDINGS: Lung bases are clear. Non IV contrast images demonstrate no focal hepatic lesion. There is a small gallstone within the gallbladder. No gallbladder inflammation. The pancreas, spleen, adrenal glands are normal.  No nephrolithiasis or ureterolithiasis. No obstructive uropathy.  Stomach, small bowel, appendix, and cecum normal. The colon demonstrates several diverticula of the descending colon sigmoid colon without acute inflammation.  Abdominal aorta is normal in caliber. No retroperitoneal or periportal lymphadenopathy. No free fluid the pelvis. The prostate gland is normal. No pelvic lymphadenopathy. Review of bone windows demonstrates no aggressive osseous lesions.  IMPRESSION: No explanation for hematuria  Cholelithiasis of without evidence of cholecystitis.  Sigmoid diverticulosis without evidence of diverticulitis.   Electronically Signed   By: Genevive Bi M.D.   On: 12/13/2012 13:12    MDM   1. Back pain      CT of the abdomen shows no acute abnormalities. Labs reassuring no hematuria discussion it is most Castillo back pain. He shows no clinical findings and has no symptoms of suggested this is radicular myelopathy. Plan anti-inflammatories  pain medications muscle relaxants and physical therapy referral.        Scott Marion, MD 12/13/12 1356

## 2012-12-13 NOTE — ED Notes (Signed)
R flank pain starting over a week ago has now radiated to L flank.  Noticed pain beginning ACEI. Denies dysuria/hematuria. Slight lightheadedness, no CP/SOB. Slight nausea this AM.

## 2012-12-19 ENCOUNTER — Other Ambulatory Visit: Payer: Self-pay | Admitting: *Deleted

## 2012-12-19 MED ORDER — GLUCOSE BLOOD VI STRP: ORAL_STRIP | Status: DC

## 2013-01-05 ENCOUNTER — Ambulatory Visit (INDEPENDENT_AMBULATORY_CARE_PROVIDER_SITE_OTHER): Payer: BC Managed Care – PPO | Admitting: Pharmacist

## 2013-01-05 ENCOUNTER — Encounter (INDEPENDENT_AMBULATORY_CARE_PROVIDER_SITE_OTHER): Payer: Self-pay

## 2013-01-05 ENCOUNTER — Encounter: Payer: Self-pay | Admitting: Pharmacist

## 2013-01-05 VITALS — BP 120/76 | HR 77 | Ht 71.0 in | Wt 242.0 lb

## 2013-01-05 DIAGNOSIS — E291 Testicular hypofunction: Secondary | ICD-10-CM

## 2013-01-05 DIAGNOSIS — R7989 Other specified abnormal findings of blood chemistry: Secondary | ICD-10-CM

## 2013-01-05 DIAGNOSIS — I10 Essential (primary) hypertension: Secondary | ICD-10-CM

## 2013-01-05 DIAGNOSIS — E119 Type 2 diabetes mellitus without complications: Secondary | ICD-10-CM

## 2013-01-05 DIAGNOSIS — F17200 Nicotine dependence, unspecified, uncomplicated: Secondary | ICD-10-CM | POA: Insufficient documentation

## 2013-01-05 DIAGNOSIS — E785 Hyperlipidemia, unspecified: Secondary | ICD-10-CM

## 2013-01-05 MED ORDER — ATORVASTATIN CALCIUM 40 MG PO TABS: 40.0000 mg | ORAL_TABLET | Freq: Every day | ORAL | Status: DC

## 2013-01-05 MED ORDER — NICOTINE 21 MG/24HR TD PT24: 1.0000 | MEDICATED_PATCH | TRANSDERMAL | Status: DC

## 2013-01-05 MED ORDER — ASPIRIN 81 MG PO TABS: 81.0000 mg | ORAL_TABLET | Freq: Every day | ORAL | Status: AC

## 2013-01-05 MED ORDER — METFORMIN HCL 1000 MG PO TABS: 1000.0000 mg | ORAL_TABLET | Freq: Two times a day (BID) | ORAL | Status: DC

## 2013-01-05 NOTE — Progress Notes (Signed)
Diabetes Follow-Up Visit Chief Complaint:   Chief Complaint  Patient presents with  . Hyperlipidemia  . Diabetes     Filed Vitals:   01/05/13 0953  BP: 120/76  Pulse: 77   Filed Weights   01/05/13 0953  Weight: 242 lb (109.77 kg)     HPI:  First visit.  Patient with uncontrolled type 2 DM.  Currently taking metformin 500mg  bid.  His last A1C was 7.7% (12/03/2012) Low fat/carbohydrate diet?  No Nicotine Abuse?  Yes Medication Compliance?  Yes Exercise?  No Alcohol Use?  Yes  Home BG Monitoring:  Checking 1 times a day. Average:  200's     Hyperlipidemia - patient's lipomed showed LDL-P of 1708.  He is not currently taking any lipid lowering medication.   Hypertension - taking lisinopril with BP at goal.  Low testosterone - free and total testosterone was elevated at last lab.  Patient does report decreased lean body mass and fatigue.   Tobacco Abuse - patient smokes about 2 ppd.  He is interested in cessation.  He recently heard from a friend that the patch worked well for his friend.      Edema:  negative  Polyuria:  negative  Polydipsia:  negative Polyphagia:  negative  BMI:  Body mass index is 33.77 kg/(m^2).   Weight changes:  none General Appearance:  alert, oriented, no acute distress and obese Mood/Affect:  normal     Lab Results  Component Value Date   HGBA1C 7.7 12/04/2012    No results found for this basename: MICROALBUR, [id number]    Lab Results  Component Value Date   CHOL 168 12/04/2012      Assessment: 1.  Diabetes.  No at goals  2.  Blood Pressure.  good 3.  Lipids.  Not at LDL-P goal 4.  Tobacco Abuse - patient ready to quit! 5.  Low testosterone -   Recommendations: 1.  Medication recommendations at this time are as follows:    Add ASA 81mg  1 tablet  Daily  Add atorvastatin 40mg  1 tablet daily  Increase metformin to 1000mg  1 bid  Add nicotine patches 21mg  1 patch daily. 2.  Reviewed HBG goals:  Fasting 80-130 and 1-2 hour post  prandial <180.  Patient is instructed to check BG 2 times per day.    3.  BP goal < 140/80. 4.  LDL goal of < 100, HDL > 40 and TG < 150. 5.  Eye Exam yearly and Dental Exam every 6 months. 6.  Dietary recommendations:  Discussed low fat and low CHO diet in depth.  Patient to increase non starchy vegetable and whole grains. 7.  Physical Activity recommendations:  Daily exercise encouraged 8.  Patient education low testosterone and also discussed benefits and risk of treatment - he is going to review treatment options and we will discuss in 1 month. 9.  Smoking cessation counseling performed.  Quit date of 01/13/13 set.  Discusses avoidance of triggers and getting support at home. 9.  Return to clinic in 4-6 wks   Time spent counseling patient:  60 minutes  Referring provider:  Pam Drown, PharmD, CPP

## 2013-01-21 ENCOUNTER — Ambulatory Visit (INDEPENDENT_AMBULATORY_CARE_PROVIDER_SITE_OTHER): Payer: BC Managed Care – PPO | Admitting: Cardiology

## 2013-01-21 ENCOUNTER — Encounter: Payer: Self-pay | Admitting: Cardiology

## 2013-01-21 VITALS — BP 134/84 | HR 87 | Ht 71.0 in | Wt 241.0 lb

## 2013-01-21 DIAGNOSIS — IMO0001 Reserved for inherently not codable concepts without codable children: Secondary | ICD-10-CM

## 2013-01-21 DIAGNOSIS — R030 Elevated blood-pressure reading, without diagnosis of hypertension: Secondary | ICD-10-CM

## 2013-01-21 DIAGNOSIS — F17200 Nicotine dependence, unspecified, uncomplicated: Secondary | ICD-10-CM

## 2013-01-21 NOTE — Patient Instructions (Signed)
The current medical regimen is effective;  continue present plan and medications.  Your physician has requested that you have an exercise tolerance test. For further information please visit [email or web address]. Please also follow instruction sheet, as given.   

## 2013-01-21 NOTE — Progress Notes (Signed)
HPI The patient has no prior cardiac history but he does have multiple cardiovascular risk factors. He presents for evaluation of this. He did have frequent stress testing years ago but hasn't had one in a while. As part of his job. 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 With this he does not describe any chest pressure, neck or arm discomfort. He does for any palpitations, presyncope or syncope. He doesn't describe PND or orthopnea though there is some dyspnea with exertion. He's had no weight gain or edema.  Allergies  Allergen Reactions  . Augmentin [Amoxicillin-Pot Clavulanate] Itching and Nausea And Vomiting  . Codeine Itching and Nausea And Vomiting    Current Outpatient Prescriptions  Medication Sig Dispense Refill  . albuterol (PROVENTIL HFA;VENTOLIN HFA) 108 (90 BASE) MCG/ACT inhaler Inhale 2 puffs into the lungs every 6 (six) hours as needed for wheezing.  1 Inhaler  3  . aspirin 81 MG tablet Take 1 tablet (81 mg total) by mouth daily.  30 tablet    . gabapentin (NEURONTIN) 100 MG capsule Take 1 capsule (100 mg total) by mouth 3 (three) times daily as needed.  90 capsule  5  . lisinopril (PRINIVIL,ZESTRIL) 5 MG tablet Take 1 tablet (5 mg total) by mouth daily.  90 tablet  3  . metFORMIN (GLUCOPHAGE) 1000 MG tablet Take 1 tablet (1,000 mg total) by mouth 2 (two) times daily with a meal.  180 tablet  1  . Vitamin D, Ergocalciferol, (DRISDOL) 50000 UNITS CAPS capsule Take 50,000 Units by mouth every 7 (seven) days.       Marland Kitchen atorvastatin (LIPITOR) 40 MG tablet Take 1 tablet (40 mg total) by mouth daily.  90 tablet  3  . glucose blood (ACCU-CHEK AVIVA PLUS) test strip Use as instructed  100 each  2  . nicotine (NICODERM CQ - DOSED IN MG/24 HOURS) 21 mg/24hr patch Place 1 patch onto the skin daily.  28 patch  0   No current facility-administered medications for this visit.    Past Medical History  Diagnosis Date  . GERD (gastroesophageal reflux disease)   .  Anxiety   . Diabetes mellitus without complication   . Sleep apnea     Past Surgical History  Procedure Laterality Date  . Foot mass excision    . Vasectomy      Family History  Problem Relation Age of Onset  . Stroke Father     History   Social History  . Marital Status: Married    Spouse Name: N/A    Number of Children: N/A  . Years of Education: N/A   Occupational History  . Not on file.   Social History Main Topics  . Smoking status: Current Every Day Smoker -- 1.50 packs/day    Types: Cigarettes  . Smokeless tobacco: Not on file  . Alcohol Use: Yes     Comment: social  . Drug Use: No  . Sexual Activity: Not on file   Other Topics Concern  . Not on file   Social History Narrative  . No narrative on file    ROS:  Positive for cervical neck pain, tinnitus, rhinitis. Otherwise as stated in the HPI and negative for all other systems.  PHYSICAL EXAM BP 134/84  Pulse 87  Ht 5\' 11"  (1.803 m)  Wt 241 lb (109.317 kg)  BMI 33.63 kg/m2 GENERAL:  Well appearing HEENT:  Pupils equal round and reactive, fundi not visualized, oral mucosa  unremarkable NECK:  No jugular venous distention, waveform within normal limits, carotid upstroke brisk and symmetric, no bruits, no thyromegaly LYMPHATICS:  No cervical, inguinal adenopathy LUNGS:  Clear to auscultation bilaterally BACK:  No CVA tenderness CHEST:  Unremarkable HEART:  PMI not displaced or sustained,S1 and S2 within normal limits, no S3, no S4, no clicks, no rubs, no murmurs ABD:  Flat, positive bowel sounds normal in frequency in pitch, no bruits, no rebound, no guarding, no midline pulsatile mass, no hepatomegaly, no splenomegaly EXT:  2 plus pulses throughout, no edema, no cyanosis no clubbing SKIN:  No rashes no nodules NEURO:  Cranial nerves II through XII grossly intact, motor grossly intact throughout PSYCH:  Cognitively intact, oriented to person place and time  EKG:  Sinus rhythm, rate 81, axis within  normal limits, intervals within normal limits, no acute ST-T wave changes.  01/21/2013   ASSESSMENT AND PLAN  TOBACCO:  He is committed to quitting smoking. He has a nicotine patches.  OVERWEIGHT:  He understands the need for weight loss with diet and exercise  MULTIPLE CARDIOVASCULAR RISK FACTORS:  I will bring the patient back for a POET (Plain Old Exercise Test). This will allow me to screen for obstructive coronary disease, risk stratify and very importantly provide a prescription for exercise.

## 2013-01-26 ENCOUNTER — Other Ambulatory Visit: Payer: Self-pay | Admitting: *Deleted

## 2013-01-26 MED ORDER — ACCU-CHEK SOFT TOUCH LANCETS MISC: Status: DC

## 2013-02-09 ENCOUNTER — Encounter: Payer: BC Managed Care – PPO | Admitting: Physician Assistant

## 2013-02-09 ENCOUNTER — Other Ambulatory Visit: Payer: Self-pay

## 2013-02-09 NOTE — Telephone Encounter (Signed)
Last seen 12/13/12  FPW  IF approved route to nurse to call into [facility]

## 2013-02-10 MED ORDER — NICOTINE 21 MG/24HR TD PT24: 21.0000 mg | MEDICATED_PATCH | TRANSDERMAL | Status: DC

## 2013-02-10 NOTE — Telephone Encounter (Signed)
Call patient : Prescription refilled & sent to pharmacy in [place]. 

## 2013-02-12 ENCOUNTER — Ambulatory Visit (INDEPENDENT_AMBULATORY_CARE_PROVIDER_SITE_OTHER): Payer: BC Managed Care – PPO | Admitting: Pharmacist

## 2013-02-12 VITALS — BP 132/72 | HR 76 | Ht 71.0 in | Wt 239.5 lb

## 2013-02-12 DIAGNOSIS — E119 Type 2 diabetes mellitus without complications: Secondary | ICD-10-CM

## 2013-02-12 DIAGNOSIS — F17200 Nicotine dependence, unspecified, uncomplicated: Secondary | ICD-10-CM

## 2013-02-12 DIAGNOSIS — E785 Hyperlipidemia, unspecified: Secondary | ICD-10-CM

## 2013-02-12 MED ORDER — NICOTINE 14 MG/24HR TD PT24: 14.0000 mg | MEDICATED_PATCH | Freq: Every day | TRANSDERMAL | Status: DC

## 2013-02-12 MED ORDER — NICOTINE 7 MG/24HR TD PT24: 7.0000 mg | MEDICATED_PATCH | Freq: Every day | TRANSDERMAL | Status: DC

## 2013-02-12 NOTE — Progress Notes (Signed)
Diabetes Follow-Up Visit Chief Complaint:   Chief Complaint  Patient presents with  . Diabetes     Filed Vitals:   02/12/13 0815  BP: 132/72  Pulse: 76   Filed Weights   02/12/13 0815  Weight: 239 lb 8 oz (108.636 kg)     HPI:  Follow up today after initial diabetes education 11/2012.  Patient with uncontrolled type 2 DM.  Currently taking metformin 1000 mg bid.  His last A1C was 7.7% (12/03/2012)  Low fat/carbohydrate diet?  yes Nicotine Abuse?  Yes Medication Compliance?  Yes Exercise?  Yes - riding stationary bike daily Alcohol Use?  Yes   Home BG Monitoring:  Checking 1 times a day. AM readings still a little elevated around 140-160 PM readings 90 - 130    Hyperlipidemia - patient's lipomed showed LDL-P of 1708.  He is was prescribed atrovastatin at last visit.  Took for 4 days but then stopped because he had nausea.  Nausea resolved once discontinued atorvastatin.     Hypertension - taking lisinopril with BP at goal.   Tobacco Abuse - Has only smoked 1 cigarette since last months visit.  This was because he ran out of nicotine patches.  He needs rx sent to pharmacy for 14mg  patches.    Edema:  negative  Polyuria:  negative  Polydipsia:  negative Polyphagia:  negative  BMI:  Body mass index is 33.42 kg/(m^2).   Weight changes:  none General Appearance:  alert, oriented, no acute distress and obese Mood/Affect:  normal     Lab Results  Component Value Date   HGBA1C 7.7 12/04/2012    No results found for this basename: MICROALBUR,  [id number]    Lab Results  Component Value Date   CHOL 168 12/04/2012      Assessment: 1.  Diabetes.  HBG readings show improvement  2.  Blood Pressure.  good 3.  Lipids.  Not at LDL-P goal, intolerant to atorvastatin 4.  Tobacco Abuse - doing great job with smoking cessation program   Recommendations: 1.  Medication recommendations at this time are as follows:    Continue metformin to 1000mg  1 bid  Decrease nicotine  patches 14mg  1 patch daily. 2.  Reviewed HBG goals:  Fasting 80-130 and 1-2 hour post prandial <180.  Patient is instructed to check BG 2 times per day.    3.  BP goal < 140/80. 4.  LDL goal of < 100, HDL > 40 and TG < 150. 5.  Eye Exam yearly and Dental Exam every 6 months. 6.  Dietary recommendations:  Continue to limit CHO intake and serving sizes 7.  Physical Activity recommendations:  Continue daily exercise 8.  Return to clinic in 3-4 weeks for labs - standing orders in computer   Time spent counseling patient:  30 minutes  Referring provider:  Pam Drown, PharmD, CPP

## 2013-03-10 ENCOUNTER — Other Ambulatory Visit (INDEPENDENT_AMBULATORY_CARE_PROVIDER_SITE_OTHER): Payer: BC Managed Care – PPO

## 2013-03-10 DIAGNOSIS — IMO0001 Reserved for inherently not codable concepts without codable children: Secondary | ICD-10-CM

## 2013-03-10 DIAGNOSIS — N529 Male erectile dysfunction, unspecified: Secondary | ICD-10-CM

## 2013-03-10 DIAGNOSIS — E119 Type 2 diabetes mellitus without complications: Secondary | ICD-10-CM

## 2013-03-10 DIAGNOSIS — K219 Gastro-esophageal reflux disease without esophagitis: Secondary | ICD-10-CM

## 2013-03-10 DIAGNOSIS — R5381 Other malaise: Secondary | ICD-10-CM

## 2013-03-10 DIAGNOSIS — M47812 Spondylosis without myelopathy or radiculopathy, cervical region: Secondary | ICD-10-CM

## 2013-03-10 DIAGNOSIS — N400 Enlarged prostate without lower urinary tract symptoms: Secondary | ICD-10-CM

## 2013-03-10 LAB — POCT GLYCOSYLATED HEMOGLOBIN (HGB A1C): Hemoglobin A1C: 6.8

## 2013-03-13 LAB — CBC WITH DIFFERENTIAL
Basophils Absolute: 0.1 10*3/uL (ref 0.0–0.2)
Basos: 1 %
Eos: 4 %
Eosinophils Absolute: 0.3 10*3/uL (ref 0.0–0.4)
HCT: 43.7 % (ref 37.5–51.0)
Hemoglobin: 15.8 g/dL (ref 12.6–17.7)
Immature Grans (Abs): 0 10*3/uL (ref 0.0–0.1)
Immature Granulocytes: 0 %
Lymphocytes Absolute: 2.7 10*3/uL (ref 0.7–3.1)
Lymphs: 34 %
MCH: 33 pg (ref 26.6–33.0)
MCHC: 36.2 g/dL — ABNORMAL HIGH (ref 31.5–35.7)
MCV: 91 fL (ref 79–97)
Monocytes Absolute: 0.6 10*3/uL (ref 0.1–0.9)
Monocytes: 7 %
Neutrophils Absolute: 4.4 10*3/uL (ref 1.4–7.0)
Neutrophils Relative %: 54 %
Platelets: 232 10*3/uL (ref 150–379)
RBC: 4.79 x10E6/uL (ref 4.14–5.80)
RDW: 13 % (ref 12.3–15.4)
WBC: 8.1 10*3/uL (ref 3.4–10.8)

## 2013-03-13 LAB — VITAMIN D 25 HYDROXY (VIT D DEFICIENCY, FRACTURES): Vit D, 25-Hydroxy: 16.2 ng/mL — ABNORMAL LOW (ref 30.0–100.0)

## 2013-03-13 LAB — BMP8+EGFR
BUN/Creatinine Ratio: 18 (ref 9–20)
BUN: 13 mg/dL (ref 6–24)
CO2: 22 mmol/L (ref 18–29)
Calcium: 9.4 mg/dL (ref 8.7–10.2)
Chloride: 99 mmol/L (ref 97–108)
Creatinine, Ser: 0.73 mg/dL — ABNORMAL LOW (ref 0.76–1.27)
GFR calc Af Amer: 119 mL/min/{1.73_m2} (ref >59)
GFR calc non Af Amer: 103 mL/min/{1.73_m2} (ref >59)
Glucose: 146 mg/dL — ABNORMAL HIGH (ref 65–99)
Potassium: 5 mmol/L (ref 3.5–5.2)
Sodium: 138 mmol/L (ref 134–144)

## 2013-03-13 LAB — HEPATIC FUNCTION PANEL
ALT: 17 IU/L (ref 0–44)
AST: 14 IU/L (ref 0–40)
Albumin: 4.5 g/dL (ref 3.5–5.5)
Alkaline Phosphatase: 63 IU/L (ref 39–117)
Bilirubin, Direct: 0.11 mg/dL (ref 0.00–0.40)
Total Bilirubin: 0.3 mg/dL (ref 0.0–1.2)
Total Protein: 6.7 g/dL (ref 6.0–8.5)

## 2013-03-13 LAB — NMR, LIPOPROFILE
Cholesterol: 235 mg/dL — ABNORMAL HIGH (ref <200)
HDL Cholesterol by NMR: 28 mg/dL — ABNORMAL LOW (ref >=40)
HDL Particle Number: 25.1 umol/L — ABNORMAL LOW (ref >=30.5)
LDL Particle Number: 1580 nmol/L — ABNORMAL HIGH (ref <1000)
LDL Size: 19.5 nm — ABNORMAL LOW (ref >20.5)
LP-IR Score: 100 — ABNORMAL HIGH (ref <=45)
Small LDL Particle Number: 1427 nmol/L — ABNORMAL HIGH (ref <=527)
Triglycerides by NMR: 1352 mg/dL — ABNORMAL HIGH (ref <150)

## 2013-03-19 ENCOUNTER — Encounter: Payer: Self-pay | Admitting: *Deleted

## 2013-04-07 ENCOUNTER — Telehealth: Payer: Self-pay | Admitting: Family Medicine

## 2013-04-07 NOTE — Telephone Encounter (Signed)
Called in vitamin D 50000IU refill to [facility] for 3 more months, then re-check labs

## 2013-04-08 ENCOUNTER — Telehealth: Payer: Self-pay | Admitting: Family Medicine

## 2013-04-08 DIAGNOSIS — E785 Hyperlipidemia, unspecified: Secondary | ICD-10-CM

## 2013-04-08 MED ORDER — VITAMIN D (ERGOCALCIFEROL) 1.25 MG (50000 UNIT) PO CAPS: 50000.0000 [IU] | ORAL_CAPSULE | ORAL | Status: DC

## 2013-04-08 NOTE — Telephone Encounter (Signed)
rx for vitamin D 50,000 IU weekly sent in.  I also discussed patient's last labs with him.  I am concerned that his triglycerides were over 1300.  He is not sure that he was fasting. He is coming in to have lipids recheck when he is fasting.

## 2013-04-10 ENCOUNTER — Other Ambulatory Visit: Payer: BC Managed Care – PPO

## 2013-04-10 DIAGNOSIS — E785 Hyperlipidemia, unspecified: Secondary | ICD-10-CM

## 2013-04-11 LAB — LIPID PANEL
CHOLESTEROL TOTAL: 157 mg/dL (ref 100–199)
Chol/HDL Ratio: 3.8 ratio units (ref 0.0–5.0)
HDL: 41 mg/dL (ref >39)
LDL CALC: 101 mg/dL — AB (ref 0–99)
Triglycerides: 74 mg/dL (ref 0–149)
VLDL CHOLESTEROL CAL: 15 mg/dL (ref 5–40)

## 2013-04-16 ENCOUNTER — Telehealth: Payer: Self-pay | Admitting: Pharmacist

## 2013-04-16 NOTE — Telephone Encounter (Signed)
Patient was unable to tolerate atorvastatin - caused myalgias and fatigue.   Most recent lipid results are much better than December lipid panel.   He is due all labs to be rechecked in March 2015.  Pending those labs will decided if future therapy needed for lipids.

## 2013-06-09 ENCOUNTER — Ambulatory Visit: Payer: BC Managed Care – PPO | Admitting: Family Medicine

## 2013-07-01 ENCOUNTER — Other Ambulatory Visit (INDEPENDENT_AMBULATORY_CARE_PROVIDER_SITE_OTHER): Payer: BC Managed Care – PPO

## 2013-07-01 DIAGNOSIS — E785 Hyperlipidemia, unspecified: Secondary | ICD-10-CM

## 2013-07-01 DIAGNOSIS — R5383 Other fatigue: Principal | ICD-10-CM

## 2013-07-01 DIAGNOSIS — R5381 Other malaise: Secondary | ICD-10-CM

## 2013-07-01 DIAGNOSIS — I10 Essential (primary) hypertension: Secondary | ICD-10-CM

## 2013-07-01 DIAGNOSIS — R7309 Other abnormal glucose: Secondary | ICD-10-CM

## 2013-07-01 DIAGNOSIS — E559 Vitamin D deficiency, unspecified: Secondary | ICD-10-CM

## 2013-07-01 LAB — POCT CBC
Granulocyte percent: 60 %G (ref 37–80)
HCT, POC: 44.8 % (ref 43.5–53.7)
Hemoglobin: 14.8 g/dL (ref 14.1–18.1)
LYMPH, POC: 2.9 (ref 0.6–3.4)
MCH, POC: 30.7 pg (ref 27–31.2)
MCHC: 33.1 g/dL (ref 31.8–35.4)
MCV: 92.7 fL (ref 80–97)
MPV: 8.6 fL (ref 0–99.8)
PLATELET COUNT, POC: 221 10*3/uL (ref 142–424)
POC Granulocyte: 5 (ref 2–6.9)
POC LYMPH %: 34.9 % (ref 10–50)
RBC: 4.8 M/uL (ref 4.69–6.13)
RDW, POC: 12.6 %
WBC: 8.4 10*3/uL (ref 4.6–10.2)

## 2013-07-01 NOTE — Progress Notes (Signed)
Patient came in for labs only.

## 2013-07-02 LAB — HEPATIC FUNCTION PANEL
ALT: 20 IU/L (ref 0–44)
AST: 17 IU/L (ref 0–40)
Albumin: 4.5 g/dL (ref 3.5–5.5)
Alkaline Phosphatase: 59 IU/L (ref 39–117)
Bilirubin, Direct: 0.13 mg/dL (ref 0.00–0.40)
Total Bilirubin: 0.5 mg/dL (ref 0.0–1.2)
Total Protein: 6.9 g/dL (ref 6.0–8.5)

## 2013-07-02 LAB — BMP8+EGFR
BUN / CREAT RATIO: 22 — AB (ref 9–20)
BUN: 18 mg/dL (ref 6–24)
CALCIUM: 9.4 mg/dL (ref 8.7–10.2)
CHLORIDE: 101 mmol/L (ref 97–108)
CO2: 22 mmol/L (ref 18–29)
Creatinine, Ser: 0.81 mg/dL (ref 0.76–1.27)
GFR calc Af Amer: 113 mL/min/{1.73_m2} (ref >59)
GFR calc non Af Amer: 98 mL/min/{1.73_m2} (ref >59)
Glucose: 126 mg/dL — ABNORMAL HIGH (ref 65–99)
Potassium: 4.7 mmol/L (ref 3.5–5.2)
SODIUM: 140 mmol/L (ref 134–144)

## 2013-07-02 LAB — NMR, LIPOPROFILE
Cholesterol: 155 mg/dL (ref <200)
HDL CHOLESTEROL BY NMR: 37 mg/dL — AB (ref >=40)
HDL Particle Number: 27.6 umol/L — ABNORMAL LOW (ref >=30.5)
LDL Particle Number: 1320 nmol/L — ABNORMAL HIGH (ref <1000)
LDL Size: 19.7 nm (ref >20.5)
LDLC SERPL CALC-MCNC: 90 mg/dL (ref <100)
LP-IR Score: 69 — ABNORMAL HIGH (ref <=45)
SMALL LDL PARTICLE NUMBER: 1088 nmol/L — AB (ref <=527)
Triglycerides by NMR: 142 mg/dL (ref <150)

## 2013-07-02 LAB — VITAMIN D 25 HYDROXY (VIT D DEFICIENCY, FRACTURES): VIT D 25 HYDROXY: 28.4 ng/mL — AB (ref 30.0–100.0)

## 2013-07-03 ENCOUNTER — Telehealth: Payer: Self-pay | Admitting: Family Medicine

## 2013-07-03 NOTE — Telephone Encounter (Signed)
Message copied by Azalee CourseFULP, ASHLEY on Fri Jul 03, 2013 11:24 AM ------      Message from: Ernestina PennaMOORE, DONALD W      Created: Thu Jul 02, 2013  3:03 PM       The blood sugar is elevated at 126. The creatinine, the most important kidney function test is within normal limits. Electrolytes including potassium are within normal limits.      All of his liver function tests were within normal limits.      With advanced lipid testing, a total LDL particle number is 1320. This is improved from 3 months ago. The LDL C. is 90. The triglycerides are within normal limits and they were very elevated previously. The good cholesterol remains low.-------- continue aggressive therapeutic lifestyle changes and any therapy that he is taking for his cholesterol management.      The vitamin D level remains low, confirm and continue that he is taking vitamin D3 5000 units daily-------if  he has been doing this regularly, finish the 5000 and change him to 50,000 units once weekly #12 refill as needed ------

## 2013-07-06 LAB — POCT GLYCOSYLATED HEMOGLOBIN (HGB A1C): Hemoglobin A1C: 6.6

## 2013-07-06 NOTE — Addendum Note (Signed)
Addended by: Orma RenderHODGES, Noely Kuhnle F on: 07/06/2013 03:51 PM   Modules accepted: Orders

## 2013-07-09 ENCOUNTER — Ambulatory Visit (INDEPENDENT_AMBULATORY_CARE_PROVIDER_SITE_OTHER): Payer: BC Managed Care – PPO | Admitting: Family Medicine

## 2013-07-09 ENCOUNTER — Encounter: Payer: Self-pay | Admitting: Family Medicine

## 2013-07-09 VITALS — BP 127/75 | HR 76 | Temp 97.6°F | Ht 71.0 in | Wt 239.0 lb

## 2013-07-09 DIAGNOSIS — E119 Type 2 diabetes mellitus without complications: Secondary | ICD-10-CM

## 2013-07-09 DIAGNOSIS — M542 Cervicalgia: Secondary | ICD-10-CM

## 2013-07-09 DIAGNOSIS — N400 Enlarged prostate without lower urinary tract symptoms: Secondary | ICD-10-CM

## 2013-07-09 DIAGNOSIS — E785 Hyperlipidemia, unspecified: Secondary | ICD-10-CM

## 2013-07-09 DIAGNOSIS — M779 Enthesopathy, unspecified: Secondary | ICD-10-CM

## 2013-07-09 DIAGNOSIS — K219 Gastro-esophageal reflux disease without esophagitis: Secondary | ICD-10-CM

## 2013-07-09 DIAGNOSIS — IMO0001 Reserved for inherently not codable concepts without codable children: Secondary | ICD-10-CM

## 2013-07-09 DIAGNOSIS — G629 Polyneuropathy, unspecified: Secondary | ICD-10-CM

## 2013-07-09 DIAGNOSIS — R030 Elevated blood-pressure reading, without diagnosis of hypertension: Secondary | ICD-10-CM

## 2013-07-09 DIAGNOSIS — M898X9 Other specified disorders of bone, unspecified site: Secondary | ICD-10-CM

## 2013-07-09 DIAGNOSIS — G589 Mononeuropathy, unspecified: Secondary | ICD-10-CM

## 2013-07-09 DIAGNOSIS — M47812 Spondylosis without myelopathy or radiculopathy, cervical region: Secondary | ICD-10-CM

## 2013-07-09 LAB — POCT UA - MICROALBUMIN: Microalbumin Ur, POC: 20 mg/L

## 2013-07-09 MED ORDER — VITAMIN D (ERGOCALCIFEROL) 1.25 MG (50000 UNIT) PO CAPS: 50000.0000 [IU] | ORAL_CAPSULE | ORAL | Status: DC

## 2013-07-09 NOTE — Progress Notes (Signed)
Subjective:    Patient ID: Scott [name], male    DOB: 05/13/1955, 58 y.o.   MRN: [id number]  HPI Pt here for follow up and management of chronic medical problems. The patient does describe some neck pain radiating down both arms with the right side being worse than the left. We will review his lab work with him today. He is due a urine microalbumin and a diabetic foot check. Recent labs were reviewed with the patient during the visit. His hemoglobin A1c indicates good blood sugar control. He did not bring any blood pressures in for review but he did give me the history of what his blood sugars were doing at home. His biggest issue today is his neck problems and peripheral neuropathy.       Patient Active Problem List   Diagnosis Date Noted  . Hyperlipidemia LDL goal < 100 02/12/2013  . Tobacco use disorder 01/05/2013  . Back pain, acute 12/13/2012  . Elevated blood pressure 12/04/2012  . H/O asbestosis 12/04/2012  . Erectile dysfunction 12/04/2012  . Neuropathy of both upper extremities 12/04/2012  . Degenerative arthritis of cervical spine 12/04/2012  . BPH (benign prostatic hyperplasia) 12/04/2012  . GERD (gastroesophageal reflux disease) 06/03/2012  . Diabetes 06/03/2012  . Generalized anxiety disorder 06/03/2012   Outpatient Encounter Prescriptions as of 07/09/2013  Medication Sig  . albuterol (PROVENTIL HFA;VENTOLIN HFA) 108 (90 BASE) MCG/ACT inhaler Inhale 2 puffs into the lungs every 6 (six) hours as needed for wheezing.  Marland Kitchen. aspirin 81 MG tablet Take 1 tablet (81 mg total) by mouth daily.  . cholecalciferol (VITAMIN D) 1000 UNITS tablet Take 1,000 Units by mouth daily.  Marland Kitchen. glucose blood (ACCU-CHEK AVIVA PLUS) test strip Use as instructed  . Lancets (ACCU-CHEK SOFT TOUCH) lancets Use as instructed  . metFORMIN (GLUCOPHAGE) 1000 MG tablet Take 1 tablet (1,000 mg total) by mouth 2 (two) times daily with a meal.  . [DISCONTINUED] gabapentin (NEURONTIN) 100 MG capsule Take 1  capsule (100 mg total) by mouth 3 (three) times daily as needed.  . [DISCONTINUED] lisinopril (PRINIVIL,ZESTRIL) 5 MG tablet Take 1 tablet (5 mg total) by mouth daily.  . [DISCONTINUED] Vitamin D, Ergocalciferol, (DRISDOL) 50000 UNITS CAPS capsule Take 1 capsule (50,000 Units total) by mouth every 7 (seven) days.  . [DISCONTINUED] nicotine (NICODERM CQ - DOSED IN MG/24 HOURS) 14 mg/24hr patch Place 1 patch (14 mg total) onto the skin daily.  . [DISCONTINUED] nicotine (NICODERM CQ - DOSED IN MG/24 HR) 7 mg/24hr patch Place 1 patch (7 mg total) onto the skin daily.    Review of Systems  Constitutional: Negative.   HENT: Negative.   Eyes: Negative.   Respiratory: Negative.   Cardiovascular: Negative.   Gastrointestinal: Negative.   Endocrine: Negative.   Genitourinary: Negative.   Musculoskeletal: Positive for back pain. Neck pain: pain radiates down arms and hands - right is worse.  Skin: Negative.   Allergic/Immunologic: Negative.   Neurological: Negative.   Hematological: Negative.   Psychiatric/Behavioral: Negative.        Objective:   Physical Exam  Nursing note and vitals reviewed. Constitutional: He is oriented to person, place, and time. He appears well-developed and well-nourished. No distress.  HENT:  Head: Normocephalic and atraumatic.  Right Ear: External ear normal.  Left Ear: External ear normal.  Mouth/Throat: Oropharynx is clear and moist. No oropharyngeal exudate.  Bilateral nasal passage dryness and irritation  Eyes: Conjunctivae and EOM are normal. Pupils are equal, round, and reactive to light.  Right eye exhibits no discharge. Left eye exhibits no discharge. No scleral icterus.  Neck: Normal range of motion. Neck supple. No tracheal deviation present. No thyromegaly present.  No anterior cervical nodes  Cardiovascular: Normal rate, regular rhythm, normal heart sounds and intact distal pulses.  Exam reveals no gallop and no friction rub.   No murmur heard. At  72 per minute  Pulmonary/Chest: Effort normal and breath sounds normal. No respiratory distress. He has no wheezes. He has no rales. He exhibits no tenderness.  No axillary nodes  Abdominal: Soft. Bowel sounds are normal. He exhibits no mass. There is no tenderness. There is no rebound and no guarding.  No inguinal nodes, minimal epigastric tenderness  Musculoskeletal: Normal range of motion. He exhibits no edema and no tenderness.  Lymphadenopathy:    He has no cervical adenopathy.  Neurological: He is alert and oriented to person, place, and time. He has normal reflexes. No cranial nerve deficit.  Skin: Skin is warm and dry. No rash noted. No erythema. No pallor.  Psychiatric: He has a normal mood and affect. His behavior is normal. Judgment and thought content normal.  Some anxiety   BP 127/75  Pulse 76  Temp(Src) 97.6 F (36.4 C) (Oral)  Ht 5\' 11"  (1.803 m)  Wt 239 lb (108.41 kg)  BMI 33.35 kg/m2        Assessment & Plan:  1. BPH (benign prostatic hyperplasia)  2. Diabetes - POCT UA - Microalbumin - Microalbumin, urine  3. Hyperlipidemia LDL goal < 100  4. GERD (gastroesophageal reflux disease)  5. Elevated blood pressure - POCT UA - Microalbumin  6. Bone spur - Ambulatory referral to Neurosurgery  7. Cervicalgia  8. Degenerative arthritis of cervical spine  9. Neuropathy Meds ordered this encounter  Medications  . cholecalciferol (VITAMIN D) 1000 UNITS tablet    Sig: Take 1,000 Units by mouth daily.  . Vitamin D, Ergocalciferol, (DRISDOL) 50000 UNITS CAPS capsule    Sig: Take 1 capsule (50,000 Units total) by mouth every 7 (seven) days.    Dispense:  12 capsule    Refill:  1   Patient Instructions  Continue current medications. Continue good therapeutic lifestyle changes which include good diet and exercise. Fall precautions discussed with patient. If an FOBT was given today- please return it to our front desk. If you are over 58 years old - you may  need Prevnar 13 or the adult Pneumonia vaccine.   Take Tylenol as needed for neck pain and neuropathy Please remember that NSAIDs like Aleve can be dangerous for your kidneys Use warm wet compresses to the neck We will arrange for you to have a visit with the neurosurgeon, Dr. Ollen BowlHarkins He can make decisions about any future physical therapy   Nyra Capeson W. Alonzo Owczarzak MD

## 2013-07-09 NOTE — Patient Instructions (Addendum)
Continue current medications. Continue good therapeutic lifestyle changes which include good diet and exercise. Fall precautions discussed with patient. If an FOBT was given today- please return it to our front desk. If you are over 459 years old - you may need Prevnar 13 or the adult Pneumonia vaccine.   Take Tylenol as needed for neck pain and neuropathy Please remember that NSAIDs like Aleve can be dangerous for your kidneys Use warm wet compresses to the neck We will arrange for you to have a visit with the neurosurgeon, Dr. Ollen BowlHarkins He can make decisions about any future physical therapy

## 2013-07-10 LAB — MICROALBUMIN, URINE: MICROALBUM., U, RANDOM: 33.9 ug/mL — AB (ref 0.0–17.0)

## 2013-07-14 ENCOUNTER — Telehealth: Payer: Self-pay | Admitting: Family Medicine

## 2013-07-14 MED ORDER — LISINOPRIL 5 MG PO TABS: 5.0000 mg | ORAL_TABLET | Freq: Every day | ORAL | Status: DC

## 2013-07-14 MED ORDER — VITAMIN D (ERGOCALCIFEROL) 1.25 MG (50000 UNIT) PO CAPS: 50000.0000 [IU] | ORAL_CAPSULE | ORAL | Status: DC

## 2013-07-14 NOTE — Telephone Encounter (Signed)
Pt aware these were sent in

## 2013-12-12 ENCOUNTER — Encounter: Payer: Self-pay | Admitting: Family

## 2013-12-12 ENCOUNTER — Ambulatory Visit (INDEPENDENT_AMBULATORY_CARE_PROVIDER_SITE_OTHER): Payer: BC Managed Care – PPO | Admitting: Family

## 2013-12-12 VITALS — BP 134/77 | HR 73 | Temp 97.7°F | Ht 71.0 in | Wt 240.0 lb

## 2013-12-12 DIAGNOSIS — K1120 Sialoadenitis, unspecified: Secondary | ICD-10-CM

## 2013-12-12 MED ORDER — CEPHALEXIN 500 MG PO CAPS: 500.0000 mg | ORAL_CAPSULE | Freq: Four times a day (QID) | ORAL | Status: DC

## 2013-12-12 MED ORDER — METHYLPREDNISOLONE (PAK) 4 MG PO TABS: ORAL_TABLET | ORAL | Status: DC

## 2013-12-12 MED ORDER — NAPROXEN 500 MG PO TABS: 500.0000 mg | ORAL_TABLET | Freq: Two times a day (BID) | ORAL | Status: DC

## 2013-12-12 NOTE — Progress Notes (Signed)
   Subjective:    Patient ID: Scott [name], male    DOB: 1955-12-14, 58 y.o.   MRN: [id number]  HPI Pt present to office for right sided neck swelling. Pt states he ate pizza on Thursday and after eating the crust he said his throat was "aggravated" and ever since then every time he eats the right side of his neck swells. Pt states it is painful after her eats.    Review of Systems  Constitutional: Negative.   HENT: Negative.   Respiratory: Negative.   Cardiovascular: Negative.   Gastrointestinal: Negative.   Endocrine: Negative.   Genitourinary: Negative.   Musculoskeletal: Negative.   Neurological: Negative.   Hematological: Negative.   Psychiatric/Behavioral: Negative.   All other systems reviewed and are negative.      Objective:   Physical Exam  Vitals reviewed. Constitutional: He is oriented to person, place, and time. He appears well-developed and well-nourished. No distress.  HENT:  Head: Normocephalic.  Right Ear: External ear normal.  Left Ear: External ear normal.  Nose: Nose normal.  Mouth/Throat: Oropharynx is clear and moist.  Eyes: Pupils are equal, round, and reactive to light. Right eye exhibits no discharge. Left eye exhibits no discharge.  Neck: Normal range of motion. Neck supple. No thyromegaly present.  Right swelling of neck   Cardiovascular: Normal rate, regular rhythm, normal heart sounds and intact distal pulses.   No murmur heard. Pulmonary/Chest: Effort normal. No respiratory distress. He has wheezes.  Abdominal: Soft. Bowel sounds are normal. He exhibits no distension. There is no tenderness.  Musculoskeletal: Normal range of motion. He exhibits no edema and no tenderness.  Neurological: He is alert and oriented to person, place, and time. He has normal reflexes. No cranial nerve deficit.  Skin: Skin is warm and dry. No rash noted. No erythema.  Psychiatric: He has a normal mood and affect. His behavior is normal. Judgment and thought  content normal.    BP 134/77  Pulse 73  Temp(Src) 97.7 F (36.5 C) (Oral)  Ht 5\' 11"  (1.803 m)  Wt 240 lb (108.863 kg)  BMI 33.49 kg/m2       Assessment & Plan:  1. Sialadenitis -Ice -make sure food is chewed good -Go to ED if airway becomes compromised  or SOB - cephALEXin (KEFLEX) 500 MG capsule; Take 1 capsule (500 mg total) by mouth 4 (four) times daily.  Dispense: 28 capsule; Refill: 0 - naproxen (NAPROSYN) 500 MG tablet; Take 1 tablet (500 mg total) by mouth 2 (two) times daily with a meal.  Dispense: 30 tablet; Refill: 0  Jannifer Rodneyhristy Josalyn Dettmann, FNP

## 2013-12-12 NOTE — Patient Instructions (Signed)
Sialadenitis °Sialadenitis is an inflammation (soreness) of the salivary glands. The parotid is the main salivary gland. It lies behind the angle of the jaw below the ear. The saliva produced comes out of a tiny opening (duct) inside the cheek on either side. This is usually at the level of the upper back teeth. If it is swollen, the ear is pushed up and out. This helps tell this condition apart from a simple lymph gland infection (swollen glands) in the same area. Mumps has mostly disappeared since the start of immunization against mumps. Now the most common cause of parotitis is germ (bacterial) infection or inflammation of the lymphatics (the lymph channels). The other major salivary gland is located in the floor of the mouth. Smaller salivary glands are located in the mouth. This includes the: °· Lips. °· Lining of the mouth. °· Pharynx. °· Hard palate (front part of the roof of the mouth). °The salivary glands do many things, including: °· Lubrication. °· Breaking down food. °· Production of hormones and antibodies (to protect against germs which may cause illness). °· Help with the sense of taste. °ACUTE BACTERIAL SIALADENITIS °This is a sudden inflammatory response to bacterial infection. This causes redness, pain, swelling and tenderness over the infected gland. In the past, it was common in dehydrated and debilitated patients often following an operation. It is now more commonly seen: °· After radiotherapy. °· In patients with poor immune systems. °Treatment is: °· The correction of fluid balance (rehydration). °· Medicine that kill germs (antibiotics). °· Pain relief. °CHRONIC RECURRENT SIALADENITIS °This refers to repeated episodes of discomfort and swelling of one of the salivary glands. It often occurs after eating. Chronic sialadenitis is usually less painful. It is associated with recurrent enlargement of a salivary gland, often following meals, and typically with an absence of redness. The chronic  form of the disease often is associated with conditions linked to decreased salivary flow, rather than dehydration (loss of body fluids). These conditions include: °· A stone, or concretion, formed in the gallbladder, kidneys, or other parts of the body (calculi). °· Salivary stasis. °· A change in the fluid and electrolyte (the salts in your body fluids) makeup of the gland. °It is treated with: °· Gland massage. °· Methods to stimulate the flow of saliva, (for example, lemon juice). °· Antibiotics if required. °Surgery to remove the gland is possible, but its benefits need to be balanced against risks.  °VIRAL SIALADENITIS °Several viruses infect the salivary glands. Some of these include the mumps virus that commonly infects the parotid gland. Other viruses causing problems are: °· The HIV virus. °· Herpes. °· Some of the influenza ("flu") viruses. °RECURRENT SIALADENITIS IN CHILDREN °This condition is thought to be due to swelling or ballooning of the ducts. It results in the same symptoms as acute bacterial parotitis. It is usually caused by germs (bacteria). It is often treated using penicillin. It may get well without treatment. Surgery is usually not required. °TUBERCULOUS SIALADENITIS °The salivary glands may become infected with the same bacteria causing tuberculosis ("TB"). Treatment is with anti-tuberculous antibiotic therapy. °OTHER UNCOMMON CAUSES OF SIALADENITIS  °· Sjogren's syndrome is a condition in which arthritis is associated with a decrease in activity of the glands of the body that produce saliva and tears. The diagnosis is made with blood tests or by examination of a piece of tissue from the inside of the lip. Some people with this condition are bothered by: °¨ A dry mouth. °¨ Intermittent salivary gland   enlargement. °· Atypical mycobacteria is a germ similar to tuberculosis. It often infects children. It is often resistant to antibiotic treatment. It may require surgical treatment to remove  the infected salivary gland. °· Actinomycosis is an infection of the parotid gland that may also involve the overlying skin. The diagnosis is made by detecting granules of sulphur produced by the bacteria on microscopic examination. Treatment is a prolonged course of penicillin for up to one year. °· Nutritional causes include vitamin deficiencies and bulimia. °· Diabetes and problems with your thyroid. °· Obesity, cirrhosis, and malabsorption are some metabolic causes. °HOME CARE INSTRUCTIONS  °· Apply ice bags every 2 hours for 15-20 minutes, while awake, to the sore gland for 24 hours, then as directed by your caregiver. Place the ice in a plastic bag with a towel around it to prevent frostbite to the skin. °· Only take over-the-counter or prescription medicines for pain, discomfort, or fever as directed by your caregiver. °SEEK IMMEDIATE MEDICAL CARE IF:  °· There is increased pain or swelling in your gland that is not controlled with medicine. °· An oral temperature above 102° F (38.9° C) develops, not controlled by medicine. °· You develop difficulty opening your mouth, swallowing, or speaking. °Document Released: 08/18/2001 Document Revised: 05/21/2011 Document Reviewed: 10/13/2007 °ExitCare® Patient Information ©2015 [employer]. This information is not intended to replace advice given to you by your health care provider. Make sure you discuss any questions you have with your health care provider. ° °

## 2013-12-14 ENCOUNTER — Other Ambulatory Visit: Payer: Self-pay | Admitting: *Deleted

## 2013-12-14 DIAGNOSIS — E119 Type 2 diabetes mellitus without complications: Secondary | ICD-10-CM

## 2013-12-14 DIAGNOSIS — IMO0001 Reserved for inherently not codable concepts without codable children: Secondary | ICD-10-CM

## 2013-12-14 DIAGNOSIS — R030 Elevated blood-pressure reading, without diagnosis of hypertension: Secondary | ICD-10-CM

## 2013-12-14 DIAGNOSIS — Z0000 Encounter for general adult medical examination without abnormal findings: Secondary | ICD-10-CM

## 2013-12-14 DIAGNOSIS — K219 Gastro-esophageal reflux disease without esophagitis: Secondary | ICD-10-CM

## 2013-12-14 DIAGNOSIS — N400 Enlarged prostate without lower urinary tract symptoms: Secondary | ICD-10-CM

## 2013-12-14 DIAGNOSIS — E559 Vitamin D deficiency, unspecified: Secondary | ICD-10-CM

## 2013-12-14 DIAGNOSIS — E785 Hyperlipidemia, unspecified: Secondary | ICD-10-CM

## 2013-12-15 ENCOUNTER — Other Ambulatory Visit: Payer: BC Managed Care – PPO

## 2013-12-16 ENCOUNTER — Telehealth: Payer: Self-pay | Admitting: *Deleted

## 2013-12-16 ENCOUNTER — Other Ambulatory Visit (INDEPENDENT_AMBULATORY_CARE_PROVIDER_SITE_OTHER): Payer: BC Managed Care – PPO

## 2013-12-16 DIAGNOSIS — E785 Hyperlipidemia, unspecified: Secondary | ICD-10-CM

## 2013-12-16 DIAGNOSIS — E559 Vitamin D deficiency, unspecified: Secondary | ICD-10-CM

## 2013-12-16 DIAGNOSIS — I10 Essential (primary) hypertension: Secondary | ICD-10-CM

## 2013-12-16 DIAGNOSIS — E109 Type 1 diabetes mellitus without complications: Secondary | ICD-10-CM

## 2013-12-16 DIAGNOSIS — N400 Enlarged prostate without lower urinary tract symptoms: Secondary | ICD-10-CM

## 2013-12-16 LAB — POCT CBC
Granulocyte percent: 58.7 %G (ref 37–80)
HCT, POC: 47.2 % (ref 43.5–53.7)
Hemoglobin: 15.1 g/dL (ref 14.1–18.1)
Lymph, poc: 2.8 (ref 0.6–3.4)
MCH, POC: 30.5 pg (ref 27–31.2)
MCHC: 32.1 g/dL (ref 31.8–35.4)
MCV: 95.2 fL (ref 80–97)
MPV: 9 fL (ref 0–99.8)
POC Granulocyte: 4.3 (ref 2–6.9)
POC LYMPH PERCENT: 37.7 %L (ref 10–50)
Platelet Count, POC: 218 10*3/uL (ref 142–424)
RBC: 5 M/uL (ref 4.69–6.13)
RDW, POC: 12.1 %
WBC: 7.4 10*3/uL (ref 4.6–10.2)

## 2013-12-16 LAB — POCT GLYCOSYLATED HEMOGLOBIN (HGB A1C): Hemoglobin A1C: 6.1

## 2013-12-16 LAB — FECAL OCCULT BLOOD, IMMUNOCHEMICAL: FECAL OCCULT BLD: POSITIVE — AB

## 2013-12-16 NOTE — Addendum Note (Signed)
Addended by: Tommas OlpHANDY, ASHLEY N on: 12/16/2013 03:50 PM   Modules accepted: Orders

## 2013-12-16 NOTE — Telephone Encounter (Signed)
Patient came in today wanting to see if he can get a refill on the antibiotic the spot on his throat is getting better but not completely. He states it is still the size of a bird egg. He is scared that it will not completely get better with another round of antibiotics.

## 2013-12-16 NOTE — Progress Notes (Signed)
Lab only 

## 2013-12-17 LAB — NMR, LIPOPROFILE
Cholesterol: 149 mg/dL (ref 100–199)
HDL Cholesterol by NMR: 35 mg/dL — ABNORMAL LOW (ref >39)
HDL Particle Number: 27.5 umol/L — ABNORMAL LOW (ref >=30.5)
LDL PARTICLE NUMBER: 1156 nmol/L — AB (ref <1000)
LDL Size: 20.4 nm (ref >20.5)
LDLC SERPL CALC-MCNC: 94 mg/dL (ref 0–99)
LP-IR Score: 67 — ABNORMAL HIGH (ref <=45)
Small LDL Particle Number: 567 nmol/L — ABNORMAL HIGH (ref <=527)
Triglycerides by NMR: 99 mg/dL (ref 0–149)

## 2013-12-17 LAB — BMP8+EGFR
BUN/Creatinine Ratio: 19 (ref 9–20)
BUN: 17 mg/dL (ref 6–24)
CALCIUM: 9.4 mg/dL (ref 8.7–10.2)
CHLORIDE: 99 mmol/L (ref 97–108)
CO2: 24 mmol/L (ref 18–29)
Creatinine, Ser: 0.9 mg/dL (ref 0.76–1.27)
GFR calc Af Amer: 108 mL/min/{1.73_m2} (ref >59)
GFR calc non Af Amer: 94 mL/min/{1.73_m2} (ref >59)
Glucose: 182 mg/dL — ABNORMAL HIGH (ref 65–99)
POTASSIUM: 4.7 mmol/L (ref 3.5–5.2)
SODIUM: 137 mmol/L (ref 134–144)

## 2013-12-17 LAB — HEPATIC FUNCTION PANEL
ALT: 15 IU/L (ref 0–44)
AST: 14 IU/L (ref 0–40)
Albumin: 4.4 g/dL (ref 3.5–5.5)
Alkaline Phosphatase: 58 IU/L (ref 39–117)
BILIRUBIN DIRECT: 0.12 mg/dL (ref 0.00–0.40)
BILIRUBIN TOTAL: 0.4 mg/dL (ref 0.0–1.2)
Total Protein: 7 g/dL (ref 6.0–8.5)

## 2013-12-17 LAB — PSA, TOTAL AND FREE
PSA FREE PCT: 26.7 %
PSA FREE: 0.08 ng/mL
PSA: 0.3 ng/mL (ref 0.0–4.0)

## 2013-12-17 LAB — VITAMIN D 25 HYDROXY (VIT D DEFICIENCY, FRACTURES): Vit D, 25-Hydroxy: 43 ng/mL (ref 30.0–100.0)

## 2013-12-18 ENCOUNTER — Other Ambulatory Visit: Payer: Self-pay | Admitting: Family

## 2013-12-18 ENCOUNTER — Telehealth: Payer: Self-pay | Admitting: Family

## 2013-12-18 DIAGNOSIS — K1120 Sialoadenitis, unspecified: Secondary | ICD-10-CM

## 2013-12-18 MED ORDER — CEPHALEXIN 500 MG PO CAPS: 500.0000 mg | ORAL_CAPSULE | Freq: Four times a day (QID) | ORAL | Status: DC

## 2013-12-18 NOTE — Telephone Encounter (Signed)
Left message on voicemail that this was sent electronically.

## 2013-12-21 ENCOUNTER — Telehealth: Payer: Self-pay | Admitting: Family Medicine

## 2013-12-21 NOTE — Progress Notes (Signed)
Patient had normal CBC last week. Appt scheduled with Dr Christell Constantmoore for 12/29/13. He will repeat CBC and pickup stool card then.  He is asymptomatic for anemia and will f/u sooner if needed. States that he has diverticulitis and he did have a flare up in the afternoon after completing the stool card.

## 2013-12-21 NOTE — Telephone Encounter (Signed)
Message copied by Doreatha MassedMOORE, MITZI on Mon Dec 21, 2013  2:44 PM ------      Message from: Ernestina PennaMOORE, DONALD W      Created: Thu Dec 17, 2013  7:50 AM       The vitamin D level is improved from 5 months ago and is now within normal limits. The patient should continue with his current treatment.      The PSA test has not changed since one year ago and is low and within normal limits.      With advanced lipid testing, the total LDL particle number is lower than it was 5 months ago but still not at goal. The LDL particle numbers now 1156. The LDL C. is less than 100 and good at 94. The triglycerides are also within normal limits. The good cholesterol or the HDL particle number remains low. This is improved by more aggressive diet habits and more exercise. Also continuing efforts at good blood sugar control will help the cholesterol and the triglycerides.      The blood sugar is elevated at 182. The creatinine, the most important kidney function test is within normal limits. The electrolytes including potassium are within normal limits. ------

## 2013-12-28 ENCOUNTER — Other Ambulatory Visit: Payer: Self-pay | Admitting: Family Medicine

## 2013-12-29 ENCOUNTER — Ambulatory Visit (INDEPENDENT_AMBULATORY_CARE_PROVIDER_SITE_OTHER): Payer: BC Managed Care – PPO

## 2013-12-29 ENCOUNTER — Ambulatory Visit (INDEPENDENT_AMBULATORY_CARE_PROVIDER_SITE_OTHER): Payer: BC Managed Care – PPO | Admitting: Family Medicine

## 2013-12-29 ENCOUNTER — Telehealth: Payer: Self-pay

## 2013-12-29 ENCOUNTER — Encounter: Payer: Self-pay | Admitting: Family Medicine

## 2013-12-29 VITALS — BP 136/84 | HR 75 | Temp 97.4°F | Ht 71.0 in | Wt 237.0 lb

## 2013-12-29 DIAGNOSIS — E669 Obesity, unspecified: Secondary | ICD-10-CM

## 2013-12-29 DIAGNOSIS — F1910 Other psychoactive substance abuse, uncomplicated: Secondary | ICD-10-CM

## 2013-12-29 DIAGNOSIS — R059 Cough, unspecified: Secondary | ICD-10-CM

## 2013-12-29 DIAGNOSIS — IMO0001 Reserved for inherently not codable concepts without codable children: Secondary | ICD-10-CM

## 2013-12-29 DIAGNOSIS — E785 Hyperlipidemia, unspecified: Secondary | ICD-10-CM

## 2013-12-29 DIAGNOSIS — K219 Gastro-esophageal reflux disease without esophagitis: Secondary | ICD-10-CM

## 2013-12-29 DIAGNOSIS — Z23 Encounter for immunization: Secondary | ICD-10-CM

## 2013-12-29 DIAGNOSIS — R635 Abnormal weight gain: Secondary | ICD-10-CM

## 2013-12-29 DIAGNOSIS — E119 Type 2 diabetes mellitus without complications: Secondary | ICD-10-CM

## 2013-12-29 DIAGNOSIS — N400 Enlarged prostate without lower urinary tract symptoms: Secondary | ICD-10-CM

## 2013-12-29 DIAGNOSIS — R030 Elevated blood-pressure reading, without diagnosis of hypertension: Secondary | ICD-10-CM

## 2013-12-29 DIAGNOSIS — R05 Cough: Secondary | ICD-10-CM

## 2013-12-29 DIAGNOSIS — Z720 Tobacco use: Secondary | ICD-10-CM

## 2013-12-29 DIAGNOSIS — K1120 Sialoadenitis, unspecified: Secondary | ICD-10-CM

## 2013-12-29 LAB — POCT UA - MICROSCOPIC ONLY
Bacteria, U Microscopic: NEGATIVE
Casts, Ur, LPF, POC: NEGATIVE
Crystals, Ur, HPF, POC: NEGATIVE
Mucus, UA: NEGATIVE
RBC, urine, microscopic: NEGATIVE
WBC, Ur, HPF, POC: NEGATIVE
YEAST UA: NEGATIVE

## 2013-12-29 LAB — POCT URINALYSIS DIPSTICK
Bilirubin, UA: NEGATIVE
Blood, UA: NEGATIVE
GLUCOSE UA: NEGATIVE
Ketones, UA: NEGATIVE
Leukocytes, UA: NEGATIVE
NITRITE UA: NEGATIVE
Protein, UA: NEGATIVE
Spec Grav, UA: <=1.005
UROBILINOGEN UA: NEGATIVE
pH, UA: 6

## 2013-12-29 MED ORDER — CEPHALEXIN 500 MG PO CAPS: 500.0000 mg | ORAL_CAPSULE | Freq: Four times a day (QID) | ORAL | Status: DC

## 2013-12-29 NOTE — Progress Notes (Signed)
Subjective:    Patient ID: Scott [name], male    DOB: 1955/06/10, 58 y.o.   MRN: [id number]  HPI Pt here for follow up and management of chronic medical problems.  The patient is doing well today. He only complains of a knot in the neck area. He is due for a chest x-ray because he is a cigarette smoker. He is also due for an FOBT and prostate/rectal exam. He needs refills on most of his medication.         Patient Active Problem List   Diagnosis Date Noted  . Hyperlipidemia 02/12/2013  . Tobacco use disorder 01/05/2013  . Back pain, acute 12/13/2012  . Elevated blood pressure 12/04/2012  . H/O asbestosis 12/04/2012  . Erectile dysfunction 12/04/2012  . Neuropathy of both upper extremities 12/04/2012  . Degenerative arthritis of cervical spine 12/04/2012  . BPH (benign prostatic hyperplasia) 12/04/2012  . GERD (gastroesophageal reflux disease) 06/03/2012  . Diabetes 06/03/2012  . Generalized anxiety disorder 06/03/2012   Outpatient Encounter Prescriptions as of 12/29/2013  Medication Sig  . aspirin 81 MG tablet Take 1 tablet (81 mg total) by mouth daily.  Marland Kitchen. glucose blood (ACCU-CHEK AVIVA PLUS) test strip Use as instructed  . Lancets (ACCU-CHEK SOFT TOUCH) lancets Use as instructed  . lisinopril (PRINIVIL,ZESTRIL) 5 MG tablet Take 1 tablet (5 mg total) by mouth daily.  . metFORMIN (GLUCOPHAGE) 1000 MG tablet Take 1 tablet (1,000 mg total) by mouth 2 (two) times daily with a meal.  . Vitamin D, Ergocalciferol, (DRISDOL) 50000 UNITS CAPS capsule Take 1 capsule (50,000 Units total) by mouth every 7 (seven) days.  . [DISCONTINUED] cholecalciferol (VITAMIN D) 1000 UNITS tablet Take 1,000 Units by mouth daily.  Marland Kitchen. albuterol (PROVENTIL HFA;VENTOLIN HFA) 108 (90 BASE) MCG/ACT inhaler Inhale 2 puffs into the lungs every 6 (six) hours as needed for wheezing.  . [DISCONTINUED] cephALEXin (KEFLEX) 500 MG capsule Take 1 capsule (500 mg total) by mouth 4 (four) times daily.  .  [DISCONTINUED] naproxen (NAPROSYN) 500 MG tablet Take 1 tablet (500 mg total) by mouth 2 (two) times daily with a meal.    Review of Systems  Constitutional: Negative.   HENT: Negative.   Eyes: Negative.   Respiratory: Negative.   Cardiovascular: Negative.   Gastrointestinal: Negative.   Endocrine: Negative.   Genitourinary: Negative.   Musculoskeletal: Negative.   Skin: Negative.        Knot under neck area  Allergic/Immunologic: Negative.   Neurological: Negative.   Hematological: Negative.   Psychiatric/Behavioral: Negative.        Objective:   Physical Exam  Nursing note and vitals reviewed. Constitutional: He is oriented to person, place, and time. He appears well-developed and well-nourished. No distress.  HENT:  Head: Normocephalic and atraumatic.  Right Ear: External ear normal.  Left Ear: External ear normal.  Nose: Nose normal.  Mouth/Throat: Oropharynx is clear and moist. No oropharyngeal exudate.  Eyes: Conjunctivae and EOM are normal. Pupils are equal, round, and reactive to light. Right eye exhibits no discharge. Left eye exhibits no discharge. No scleral icterus.  Neck: Normal range of motion. Neck supple. No thyromegaly present.  There is swelling below the right mandible which appears to be a swollen submental gland . There are no carotid bruits  Cardiovascular: Normal rate, regular rhythm, normal heart sounds and intact distal pulses.   No murmur heard. At 72 per minute  Pulmonary/Chest: Effort normal and breath sounds normal. No respiratory distress. He has no wheezes. He  has no rales. He exhibits no tenderness.  Dry cough, or wheezes chest is clear anteriorly and posterior  Abdominal: Soft. Bowel sounds are normal. He exhibits no mass. There is no tenderness. There is no rebound and no guarding.   Obesity with out masses or abdominal bruits  Genitourinary: Rectum normal and penis normal. No penile tenderness.  The prostate is enlarged and smooth. There  are no rectal masses. There no inguinal hernias. There are no inguinal nodes. The external genitalia are normal  Musculoskeletal: Normal range of motion. He exhibits no edema and no tenderness.  There is limited range of motion with abduction of both shoulders.  Lymphadenopathy:    He has no cervical adenopathy.  Neurological: He is alert and oriented to person, place, and time. He has normal reflexes. No cranial nerve deficit.  Skin: Skin is warm and dry. No rash noted. No erythema. No pallor.  Psychiatric: He has a normal mood and affect. His behavior is normal. Judgment and thought content normal.    BP 136/84  Pulse 75  Temp(Src) 97.4 F (36.3 C) (Oral)  Ht 5\' 11"  (1.803 m)  Wt 237 lb (107.502 kg)  BMI 33.07 kg/m2  [facility] reading (PRIMARY) by  Dr. Tracie HarrierMoore-chest x-ray-  degenerative changes in thoracic spine with no active disease                                    Assessment & Plan:  1. BPH (benign prostatic hyperplasia)  2. Type 2 diabetes mellitus without complication  3. Elevated blood pressure - DG Chest 2 View; Future  4. Gastroesophageal reflux disease, esophagitis presence not specified  5. Hyperlipidemia  6. Nicotine abuse  7. Mildly obese  8. Sialadenitis - cephALEXin (KEFLEX) 500 MG capsule; Take 1 capsule (500 mg total) by mouth 4 (four) times daily.  Dispense: 28 capsule; Refill: 1  9. Cough - DG Chest 2 View; Future  Meds ordered this encounter  Medications  . cephALEXin (KEFLEX) 500 MG capsule    Sig: Take 1 capsule (500 mg total) by mouth 4 (four) times daily.    Dispense:  28 capsule    Refill:  1   Patient Instructions  Continue to exercise and diet Continue to monitor blood sugars closely Continue to drink plenty of fluids Stop smoking Please do not put yourself at risk for falling Take another round of antibiotics as directed, if the swelling beneath your mandible persists he will need an appointment with an air nose and throat specialist  for further evaluation Do not forget to check with your insurance about the Prevnar vaccine Please return the FOBT   Scott Capeson W. Shemekia Patane MD

## 2013-12-29 NOTE — Telephone Encounter (Signed)
Message copied by Roselee CulverHUMLEY, Demaryius Imran on Tue Dec 29, 2013  1:45 PM ------      Message from: Ernestina PennaMOORE, DONALD W      Created: Tue Dec 29, 2013  1:34 PM       As per radiology report ------

## 2013-12-29 NOTE — Patient Instructions (Signed)
Continue to exercise and diet Continue to monitor blood sugars closely Continue to drink plenty of fluids Stop smoking Please do not put yourself at risk for falling Take another round of antibiotics as directed, if the swelling beneath your mandible persists he will need an appointment with an air nose and throat specialist for further evaluation Do not forget to check with your insurance about the Prevnar vaccine Please return the FOBT

## 2013-12-29 NOTE — Telephone Encounter (Signed)
Pt aware of CXR results.

## 2013-12-29 NOTE — Addendum Note (Signed)
Addended by: Prescott GumLAND, Slater Mcmanaman M on: 12/29/2013 10:50 AM   Modules accepted: Orders

## 2014-01-04 NOTE — Telephone Encounter (Signed)
Patient aware.

## 2014-02-16 ENCOUNTER — Other Ambulatory Visit: Payer: Self-pay | Admitting: Family Medicine

## 2014-03-15 ENCOUNTER — Encounter: Payer: Self-pay | Admitting: *Deleted

## 2014-03-22 ENCOUNTER — Encounter: Payer: Self-pay | Admitting: Family Medicine

## 2014-03-22 ENCOUNTER — Ambulatory Visit (INDEPENDENT_AMBULATORY_CARE_PROVIDER_SITE_OTHER): Payer: BLUE CROSS/BLUE SHIELD | Admitting: Family Medicine

## 2014-03-22 VITALS — BP 133/78 | HR 88 | Temp 97.1°F | Ht 71.0 in | Wt 238.0 lb

## 2014-03-22 DIAGNOSIS — K1120 Sialoadenitis, unspecified: Secondary | ICD-10-CM

## 2014-03-22 NOTE — Progress Notes (Signed)
Subjective:    Patient ID: Scott [name], male    DOB: [birth year], 59 y.o.   MRN: [id number]  HPI Patient here today for right side of neck/ gland swelling again. He recently took a round of Keflex and it got better for a short time.          Patient Active Problem List   Diagnosis Date Noted  . Hyperlipidemia 02/12/2013  . Tobacco use disorder 01/05/2013  . Back pain, acute 12/13/2012  . Elevated blood pressure 12/04/2012  . H/O asbestosis 12/04/2012  . Erectile dysfunction 12/04/2012  . Neuropathy of both upper extremities 12/04/2012  . Degenerative arthritis of cervical spine 12/04/2012  . BPH (benign prostatic hyperplasia) 12/04/2012  . GERD (gastroesophageal reflux disease) 06/03/2012  . Diabetes 06/03/2012  . Generalized anxiety disorder 06/03/2012   Outpatient Encounter Prescriptions as of 03/22/2014  Medication Sig  . albuterol (PROVENTIL HFA;VENTOLIN HFA) 108 (90 BASE) MCG/ACT inhaler Inhale 2 puffs into the lungs every 6 (six) hours as needed for wheezing.  Marland Kitchen aspirin 81 MG tablet Take 1 tablet (81 mg total) by mouth daily.  Marland Kitchen glucose blood (ACCU-CHEK AVIVA PLUS) test strip Use as instructed  . Lancets (ACCU-CHEK SOFT TOUCH) lancets Use as instructed  . lisinopril (PRINIVIL,ZESTRIL) 5 MG tablet Take 1 tablet (5 mg total) by mouth daily.  . metFORMIN (GLUCOPHAGE) 500 MG tablet TAKE 2 TABLETS (1,000 MG TOTAL) BY MOUTH 2 (TWO) TIMES DAILY.  Marland Kitchen Vitamin D, Ergocalciferol, (DRISDOL) 50000 UNITS CAPS capsule One po every 7 days  . [DISCONTINUED] cephALEXin (KEFLEX) 500 MG capsule Take 1 capsule (500 mg total) by mouth 4 (four) times daily.  . [DISCONTINUED] metFORMIN (GLUCOPHAGE) 1000 MG tablet Take 1 tablet (1,000 mg total) by mouth 2 (two) times daily with a meal.    Review of Systems  Constitutional: Negative.   HENT: Negative.        Right side neck/ gland swelling and pain.  Eyes: Negative.   Respiratory: Negative.   Cardiovascular: Negative.     Gastrointestinal: Negative.   Endocrine: Negative.   Genitourinary: Negative.   Musculoskeletal: Negative.   Skin: Negative.   Allergic/Immunologic: Negative.   Neurological: Negative.   Hematological: Negative.   Psychiatric/Behavioral: Negative.        Objective:   Physical Exam  Constitutional: He is oriented to person, place, and time. He appears well-developed and well-nourished.  HENT:  Head: Normocephalic and atraumatic.  Right Ear: External ear normal.  Left Ear: External ear normal.  Nose: Nose normal.  Mouth/Throat: Oropharynx is clear and moist.  Eyes: Conjunctivae and EOM are normal. Pupils are equal, round, and reactive to light. Right eye exhibits no discharge. No scleral icterus.  Neck: Normal range of motion. Neck supple. No thyromegaly present.  There is fullness and tenderness below the right mandible at the angle of the jaw right greater than left. The oral cavity appears normal with no discharge or draining noted there.  Cardiovascular: Normal rate, regular rhythm and normal heart sounds.   No murmur heard. Pulmonary/Chest: Effort normal and breath sounds normal. No respiratory distress. He has no wheezes. He has no rales.  Musculoskeletal: Normal range of motion.  Lymphadenopathy:    He has no cervical adenopathy.  Neurological: He is alert and oriented to person, place, and time.  Skin: Skin is warm and dry. No rash noted.  Psychiatric: He has a normal mood and affect. His behavior is normal. Judgment and thought content normal.  Nursing note and vitals reviewed.  BP 133/78 mmHg  Pulse 88  Temp(Src) 97.1 F (36.2 C) (Oral)  Ht 5\' 11"  (1.803 m)  Wt 238 lb (107.956 kg)  BMI 33.21 kg/m2        Assessment & Plan:  1. Salivary gland infection, recurrent - Ambulatory referral to ENT -Avoid excessive chewing and drink plenty of fluids -We'll not take any more antibiotics, hoping that an appointment can be made with the ear nose and throat specialist  within a short period of time  Patient Instructions  We will refer you to ENT - we hope that you will be seen there within the next 2-3 days  We will refer you because of this recurrent submental salivary gland infection on the right side We will try to avoid antibiotics until he is able to see you Continue his little chewing as possible and drink plenty of fluids   Nyra Capeson W. Kelee Cunningham MD

## 2014-03-22 NOTE — Patient Instructions (Addendum)
We will refer you to ENT - we hope that you will be seen there within the next 2-3 days  We will refer you because of this recurrent submental salivary gland infection on the right side We will try to avoid antibiotics until he is able to see you Continue his little chewing as possible and drink plenty of fluids

## 2014-03-25 ENCOUNTER — Other Ambulatory Visit: Payer: Self-pay | Admitting: Otolaryngology

## 2014-03-25 ENCOUNTER — Other Ambulatory Visit: Payer: Self-pay | Admitting: Family Medicine

## 2014-03-25 DIAGNOSIS — R221 Localized swelling, mass and lump, neck: Secondary | ICD-10-CM

## 2014-03-25 DIAGNOSIS — K118 Other diseases of salivary glands: Secondary | ICD-10-CM

## 2014-03-25 DIAGNOSIS — R220 Localized swelling, mass and lump, head: Secondary | ICD-10-CM

## 2014-03-26 ENCOUNTER — Ambulatory Visit
Admission: RE | Admit: 2014-03-26 | Discharge: 2014-03-26 | Disposition: A | Discharge status: Home / Self Care | Payer: BLUE CROSS/BLUE SHIELD | Source: Ambulatory Visit | Attending: Otolaryngology | Admitting: Otolaryngology

## 2014-03-26 DIAGNOSIS — R220 Localized swelling, mass and lump, head: Secondary | ICD-10-CM

## 2014-03-26 DIAGNOSIS — R221 Localized swelling, mass and lump, neck: Secondary | ICD-10-CM

## 2014-03-26 DIAGNOSIS — K118 Other diseases of salivary glands: Secondary | ICD-10-CM

## 2014-03-26 MED ORDER — IOHEXOL 300 MG/ML  SOLN
75.0000 mL | Freq: Once | INTRAMUSCULAR | Status: AC | PRN
  Administered 2014-03-26: 75 mL via INTRAVENOUS

## 2014-04-07 ENCOUNTER — Other Ambulatory Visit: Payer: Self-pay | Admitting: Family Medicine

## 2014-04-12 HISTORY — PX: OTHER SURGICAL HISTORY: SHX169

## 2014-04-14 ENCOUNTER — Other Ambulatory Visit: Payer: Self-pay | Admitting: Otolaryngology

## 2014-05-05 ENCOUNTER — Other Ambulatory Visit: Payer: Self-pay | Admitting: Gastroenterology

## 2014-05-05 DIAGNOSIS — R109 Unspecified abdominal pain: Secondary | ICD-10-CM

## 2014-05-11 ENCOUNTER — Other Ambulatory Visit: Payer: BLUE CROSS/BLUE SHIELD

## 2014-05-11 ENCOUNTER — Ambulatory Visit
Admission: RE | Admit: 2014-05-11 | Discharge: 2014-05-11 | Disposition: A | Discharge status: Home / Self Care | Payer: BLUE CROSS/BLUE SHIELD | Source: Ambulatory Visit | Attending: Gastroenterology | Admitting: Gastroenterology

## 2014-05-11 DIAGNOSIS — R109 Unspecified abdominal pain: Secondary | ICD-10-CM

## 2014-06-01 ENCOUNTER — Other Ambulatory Visit: Payer: Self-pay | Admitting: *Deleted

## 2014-06-01 MED ORDER — METFORMIN HCL 500 MG PO TABS: ORAL_TABLET | ORAL | Status: DC

## 2014-06-18 ENCOUNTER — Other Ambulatory Visit: Payer: Self-pay | Admitting: Family Medicine

## 2014-07-02 ENCOUNTER — Ambulatory Visit: Payer: BC Managed Care – PPO | Admitting: Family Medicine

## 2014-07-05 ENCOUNTER — Encounter: Payer: Self-pay | Admitting: Family Medicine

## 2014-07-05 ENCOUNTER — Ambulatory Visit (INDEPENDENT_AMBULATORY_CARE_PROVIDER_SITE_OTHER): Payer: BLUE CROSS/BLUE SHIELD | Admitting: Family Medicine

## 2014-07-05 VITALS — BP 104/63 | HR 89 | Temp 97.8°F | Ht 71.0 in | Wt 231.0 lb

## 2014-07-05 DIAGNOSIS — I10 Essential (primary) hypertension: Secondary | ICD-10-CM

## 2014-07-05 DIAGNOSIS — R05 Cough: Secondary | ICD-10-CM

## 2014-07-05 DIAGNOSIS — E119 Type 2 diabetes mellitus without complications: Secondary | ICD-10-CM

## 2014-07-05 DIAGNOSIS — K1120 Sialoadenitis, unspecified: Secondary | ICD-10-CM

## 2014-07-05 DIAGNOSIS — N400 Enlarged prostate without lower urinary tract symptoms: Secondary | ICD-10-CM | POA: Diagnosis not present

## 2014-07-05 DIAGNOSIS — E785 Hyperlipidemia, unspecified: Secondary | ICD-10-CM | POA: Diagnosis not present

## 2014-07-05 DIAGNOSIS — K219 Gastro-esophageal reflux disease without esophagitis: Secondary | ICD-10-CM | POA: Diagnosis not present

## 2014-07-05 DIAGNOSIS — J40 Bronchitis, not specified as acute or chronic: Secondary | ICD-10-CM | POA: Diagnosis not present

## 2014-07-05 DIAGNOSIS — J209 Acute bronchitis, unspecified: Secondary | ICD-10-CM

## 2014-07-05 DIAGNOSIS — E559 Vitamin D deficiency, unspecified: Secondary | ICD-10-CM | POA: Diagnosis not present

## 2014-07-05 DIAGNOSIS — R059 Cough, unspecified: Secondary | ICD-10-CM

## 2014-07-05 LAB — POCT CBC
GRANULOCYTE PERCENT: 65.8 % (ref 37–80)
HEMATOCRIT: 49.4 % (ref 43.5–53.7)
Hemoglobin: 15.2 g/dL (ref 14.1–18.1)
Lymph, poc: 2.8 (ref 0.6–3.4)
MCH, POC: 28.9 pg (ref 27–31.2)
MCHC: 30.8 g/dL — AB (ref 31.8–35.4)
MCV: 93.9 fL (ref 80–97)
MPV: 8.2 fL (ref 0–99.8)
POC GRANULOCYTE: 6.9 (ref 2–6.9)
POC LYMPH %: 26.3 % (ref 10–50)
Platelet Count, POC: 237 10*3/uL (ref 142–424)
RBC: 5.26 M/uL (ref 4.69–6.13)
RDW, POC: 12.5 %
WBC: 10.5 10*3/uL — AB (ref 4.6–10.2)

## 2014-07-05 MED ORDER — LEVOFLOXACIN 500 MG PO TABS: 500.0000 mg | ORAL_TABLET | Freq: Every day | ORAL | Status: DC

## 2014-07-05 MED ORDER — BENZONATATE 100 MG PO CAPS: 100.0000 mg | ORAL_CAPSULE | Freq: Two times a day (BID) | ORAL | Status: DC | PRN

## 2014-07-05 NOTE — Progress Notes (Signed)
Subjective:    Patient ID: Scott [name], male    DOB: 02/08/1956, 59 y.o.   MRN: [id number]  HPI Pt here for follow up and management of chronic medical problems which includes hypertension and diabetes. He is taking medications regularly. The patient had surgery on one of his salivary glands with removal of the stone and a difficult recovery from this. He has been coughing and congested for over a week and has just finished a Z-Pak and still has some cough and congestion. His wife is also sick. His sputum is clear in color. He has not been checking his blood sugars as regularly because of the salivary gland surgery he lost a lot of weight unable to eat and chew. The patient denies chest pain or shortness of breath.      Patient Active Problem List   Diagnosis Date Noted  . Hyperlipidemia 02/12/2013  . Tobacco use disorder 01/05/2013  . Back pain, acute 12/13/2012  . Elevated blood pressure 12/04/2012  . H/O asbestosis 12/04/2012  . Erectile dysfunction 12/04/2012  . Neuropathy of both upper extremities 12/04/2012  . Degenerative arthritis of cervical spine 12/04/2012  . BPH (benign prostatic hyperplasia) 12/04/2012  . GERD (gastroesophageal reflux disease) 06/03/2012  . Diabetes 06/03/2012  . Generalized anxiety disorder 06/03/2012   Outpatient Encounter Prescriptions as of 07/05/2014  Medication Sig  . albuterol (PROVENTIL HFA;VENTOLIN HFA) 108 (90 BASE) MCG/ACT inhaler Inhale 2 puffs into the lungs every 6 (six) hours as needed for wheezing.  Marland Kitchen aspirin 81 MG tablet Take 1 tablet (81 mg total) by mouth daily.  Marland Kitchen glucose blood (ACCU-CHEK AVIVA PLUS) test strip Use as instructed  . Lancets (ACCU-CHEK SOFT TOUCH) lancets Use as instructed  . lisinopril (PRINIVIL,ZESTRIL) 5 MG tablet TAKE 1 TABLET BY MOUTH EVERY DAY  . metFORMIN (GLUCOPHAGE) 500 MG tablet TAKE 2 TABLETS (1,000 MG TOTAL) BY MOUTH 2 (TWO) TIMES DAILY.  Marland Kitchen Vitamin D, Ergocalciferol, (DRISDOL) 50000 UNITS CAPS capsule  One po every 7 days     Review of Systems  Constitutional: Negative.   HENT: Positive for congestion (head and chest) and sneezing.   Eyes: Negative.   Respiratory: Positive for cough.   Cardiovascular: Negative.   Gastrointestinal: Negative.   Endocrine: Negative.   Genitourinary: Negative.   Musculoskeletal: Negative.   Skin: Negative.   Allergic/Immunologic: Negative.   Neurological: Negative.   Hematological: Negative.   Psychiatric/Behavioral: Negative.        Objective:   Physical Exam  Constitutional: He is oriented to person, place, and time. He appears well-developed and well-nourished. He appears distressed (the patient remains distressed and upset regarding neck surgery he had in February to remove a stone from a salivary gland or duct.).  HENT:  Head: Normocephalic and atraumatic.  Right Ear: External ear normal.  Left Ear: External ear normal.  Mouth/Throat: Oropharynx is clear and moist.  There is some nasal congestion bilaterally  Eyes: Conjunctivae and EOM are normal. Pupils are equal, round, and reactive to light. Right eye exhibits no discharge. Left eye exhibits no discharge. No scleral icterus.  Neck: Normal range of motion. Neck supple. No thyromegaly present.  No anterior cervical adenopathy was palpable. There is swelling in the right neck with a incision line which apparently is healing but scarred.  Cardiovascular: Normal rate, regular rhythm, normal heart sounds and intact distal pulses.  Exam reveals no gallop and no friction rub.   No murmur heard. At 72/m  Pulmonary/Chest: Effort normal. No respiratory distress. He  has wheezes. He has no rales. He exhibits no tenderness.  Rhonchi and wheezes bilaterally  Abdominal: Soft. Bowel sounds are normal. He exhibits no mass. There is no tenderness. There is no rebound and no guarding.  Musculoskeletal: Normal range of motion. He exhibits no edema or tenderness.  Lymphadenopathy:    He has no cervical  adenopathy.  Neurological: He is alert and oriented to person, place, and time. He has normal reflexes. A cranial nerve deficit is present.  The patient still has some asymmetry with his ability to smile. This is occurring since the surgery but is getting better.  Skin: Skin is warm and dry. No rash noted. No erythema. No pallor.  Psychiatric: He has a normal mood and affect. His behavior is normal. Judgment and thought content normal.  Nursing note and vitals reviewed.  BP 104/63 mmHg  Pulse 89  Temp(Src) 97.8 F (36.6 C) (Oral)  Ht 5' 11"  (1.803 m)  Wt 231 lb (104.781 kg)  BMI 32.23 kg/m2  [facility] reading (PRIMARY) by  Dr. Brunilda Payor x-ray will be done later this afternoon                                        Assessment & Plan:  1. BPH (benign prostatic hyperplasia) -This was not checked today but the patient is having no problems with voiding. - POCT CBC; Future - POCT CBC  2. Type 2 diabetes mellitus without complication -The patient indicates that the few blood sugars he has checked himself have been good and he will continue with aggressive therapeutic lifestyle changes - POCT CBC; Future - BMP8+EGFR; Future - POCT glycosylated hemoglobin (Hb A1C); Future - POCT CBC - BMP8+EGFR  3. Gastroesophageal reflux disease, esophagitis presence not specified -He is having no problems with reflux - POCT CBC; Future - POCT CBC  4. Hyperlipidemia -We will monitor this with the blood work that will be done in the future and prescribed medication if necessary - POCT CBC; Future - NMR, lipoprofile; Future - POCT CBC  5. Essential hypertension, benign -The blood pressure is good today and he will continue with his lisinopril - POCT CBC; Future - BMP8+EGFR; Future - Hepatic function panel; Future - POCT CBC - BMP8+EGFR  6. Vitamin D deficiency -The patient will continue with his vitamin D until the next lab work is checked. - Vit D  25 hydroxy (rtn osteoporosis  monitoring); Future  7. Cough -He will use Tessalon Perles and/or Mucinex for cough and congestion - DG Chest 2 View; Future - levofloxacin (LEVAQUIN) 500 MG tablet; Take 1 tablet (500 mg total) by mouth daily.  Dispense: 7 tablet; Refill: 0  8. Bronchitis with bronchospasm -Continue to drink plenty of fluids and use inhalers as directed  9. Sialadenitis -He is recovering from removal of a stone and will follow-up with the surgeon as needed  Patient Instructions   Continue current medications. Continue good therapeutic lifestyle changes which include good diet and exercise. Fall precautions discussed with patient. If an FOBT was given today- please return it to our front desk. If you are over 54 years old - you may need Prevnar 63 or the adult Pneumonia vaccine.  Flu Shots are still available at our office. If you still haven't had one please call to set up a nurse visit to get one.   After your visit with [place] today you will receive a survey  in the mail or online from [employer] regarding your care with [place]. Please take a moment to fill this out. Your feedback is very important to [place] as you can help [place] better understand your patient needs as well as improve your experience and satisfaction. WE CARE ABOUT YOU!!!   Take Mucinex maximum strength, blue and white in color, 1 twice daily with a large glass of water Use Brio ellipta inhaler, 1 puff daily and rinse mouth after using Drink plenty of fluids Use Tessalon Perles as needed for severe cough Use nasal saline 4 times daily in the side of the nose Take antibiotic as directed   Arrie Senate MD

## 2014-07-05 NOTE — Patient Instructions (Addendum)
Continue current medications. Continue good therapeutic lifestyle changes which include good diet and exercise. Fall precautions discussed with patient. If an FOBT was given today- please return it to our front desk. If you are over 59 years old - you may need Prevnar 13 or the adult Pneumonia vaccine.  Flu Shots are still available at our office. If you still haven't had one please call to set up a nurse visit to get one.   After your visit with us today you will receive a survey in the mail or online from [employer] regarding your care with us. Please take a moment to fill this out. Your feedback is very important to us as you can help us better understand your patient needs as well as improve your experience and satisfaction. WE CARE ABOUT YOU!!!   Take Mucinex maximum strength, blue and white in color, 1 twice daily with a large glass of water Use Brio ellipta inhaler, 1 puff daily and rinse mouth after using Drink plenty of fluids Use Tessalon Perles as needed for severe cough Use nasal saline 4 times daily in the side of the nose Take antibiotic as directed

## 2014-07-06 ENCOUNTER — Other Ambulatory Visit (INDEPENDENT_AMBULATORY_CARE_PROVIDER_SITE_OTHER): Payer: BLUE CROSS/BLUE SHIELD

## 2014-07-06 DIAGNOSIS — R05 Cough: Secondary | ICD-10-CM | POA: Diagnosis not present

## 2014-07-06 DIAGNOSIS — R059 Cough, unspecified: Secondary | ICD-10-CM

## 2014-07-06 LAB — BMP8+EGFR
BUN/Creatinine Ratio: 17 (ref 9–20)
BUN: 13 mg/dL (ref 6–24)
CALCIUM: 9.9 mg/dL (ref 8.7–10.2)
CO2: 23 mmol/L (ref 18–29)
CREATININE: 0.75 mg/dL — AB (ref 0.76–1.27)
Chloride: 100 mmol/L (ref 97–108)
GFR calc Af Amer: 116 mL/min/{1.73_m2} (ref >59)
GFR calc non Af Amer: 100 mL/min/{1.73_m2} (ref >59)
GLUCOSE: 110 mg/dL — AB (ref 65–99)
Potassium: 4.5 mmol/L (ref 3.5–5.2)
SODIUM: 140 mmol/L (ref 134–144)

## 2014-07-13 ENCOUNTER — Other Ambulatory Visit (INDEPENDENT_AMBULATORY_CARE_PROVIDER_SITE_OTHER): Payer: BLUE CROSS/BLUE SHIELD

## 2014-07-13 DIAGNOSIS — E559 Vitamin D deficiency, unspecified: Secondary | ICD-10-CM

## 2014-07-13 DIAGNOSIS — E119 Type 2 diabetes mellitus without complications: Secondary | ICD-10-CM | POA: Diagnosis not present

## 2014-07-13 DIAGNOSIS — I10 Essential (primary) hypertension: Secondary | ICD-10-CM

## 2014-07-13 DIAGNOSIS — E785 Hyperlipidemia, unspecified: Secondary | ICD-10-CM

## 2014-07-13 LAB — POCT GLYCOSYLATED HEMOGLOBIN (HGB A1C): HEMOGLOBIN A1C: 6.7

## 2014-07-13 NOTE — Addendum Note (Signed)
Addended by: Prescott GumLAND, Tasmin Exantus M on: 07/13/2014 08:38 AM   Modules accepted: Orders

## 2014-07-13 NOTE — Progress Notes (Signed)
Lab only 

## 2014-07-14 ENCOUNTER — Telehealth: Payer: Self-pay | Admitting: *Deleted

## 2014-07-14 LAB — HEPATIC FUNCTION PANEL
ALK PHOS: 66 IU/L (ref 39–117)
ALT: 15 IU/L (ref 0–44)
AST: 11 IU/L (ref 0–40)
Albumin: 4.4 g/dL (ref 3.5–5.5)
BILIRUBIN, DIRECT: 0.08 mg/dL (ref 0.00–0.40)
Bilirubin Total: 0.3 mg/dL (ref 0.0–1.2)
TOTAL PROTEIN: 7.1 g/dL (ref 6.0–8.5)

## 2014-07-14 LAB — LIPID PANEL
Chol/HDL Ratio: 4.8 ratio units (ref 0.0–5.0)
Cholesterol, Total: 162 mg/dL (ref 100–199)
HDL: 34 mg/dL — ABNORMAL LOW (ref >39)
LDL Calculated: 77 mg/dL (ref 0–99)
Triglycerides: 257 mg/dL — ABNORMAL HIGH (ref 0–149)
VLDL Cholesterol Cal: 51 mg/dL — ABNORMAL HIGH (ref 5–40)

## 2014-07-14 LAB — VITAMIN D 25 HYDROXY (VIT D DEFICIENCY, FRACTURES): Vit D, 25-Hydroxy: 32.2 ng/mL (ref 30.0–100.0)

## 2014-07-14 NOTE — Telephone Encounter (Signed)
-----   Message from Ernestina Pennaonald W Moore, MD sent at 07/14/2014  7:49 AM EDT ----- A traditional lipid panel has a total cholesterol that is good. The LDL C is good. The triglycerides are elevated at 257 and this may be due to the fact that he was not fasting when the blood was drawn. Please confirm this.++++++++ All liver function tests were within normal limits. The vitamin D level was at the low end of the normal range.----He should continue taking vitamin D 50,000 units weekly. Make sure he is taking this regularly.

## 2014-07-15 ENCOUNTER — Ambulatory Visit (INDEPENDENT_AMBULATORY_CARE_PROVIDER_SITE_OTHER): Payer: BLUE CROSS/BLUE SHIELD | Admitting: Family Medicine

## 2014-07-15 ENCOUNTER — Encounter: Payer: Self-pay | Admitting: Family Medicine

## 2014-07-15 VITALS — BP 108/75 | HR 80 | Temp 97.6°F | Ht 71.0 in | Wt 233.0 lb

## 2014-07-15 DIAGNOSIS — J209 Acute bronchitis, unspecified: Secondary | ICD-10-CM

## 2014-07-15 DIAGNOSIS — J40 Bronchitis, not specified as acute or chronic: Secondary | ICD-10-CM | POA: Diagnosis not present

## 2014-07-15 NOTE — Patient Instructions (Signed)
The patient should continue with plenty of fluids and avoid irritating environments He should continue to take Mucinex maximum strength 1 twice daily with a large glass of water for a couple of more weeks.

## 2014-07-15 NOTE — Progress Notes (Signed)
Subjective:    Patient ID: Scott [name], male    DOB: 1955/09/13, 59 y.o.   MRN: [id number]  HPI  59 year old male comes in today to follow up on his cough. He reports that it has resolved and he is feeling much better. He has minimal cough and has finished all of the antibiotic.  Patient Active Problem List   Diagnosis Date Noted  . Hyperlipidemia 02/12/2013  . Tobacco use disorder 01/05/2013  . Back pain, acute 12/13/2012  . Elevated blood pressure 12/04/2012  . H/O asbestosis 12/04/2012  . Erectile dysfunction 12/04/2012  . Neuropathy of both upper extremities 12/04/2012  . Degenerative arthritis of cervical spine 12/04/2012  . BPH (benign prostatic hyperplasia) 12/04/2012  . GERD (gastroesophageal reflux disease) 06/03/2012  . Diabetes 06/03/2012  . Generalized anxiety disorder 06/03/2012   Outpatient Encounter Prescriptions as of 07/15/2014  Medication Sig  . albuterol (PROVENTIL HFA;VENTOLIN HFA) 108 (90 BASE) MCG/ACT inhaler Inhale 2 puffs into the lungs every 6 (six) hours as needed for wheezing.  Marland Kitchen. aspirin 81 MG tablet Take 1 tablet (81 mg total) by mouth daily.  Marland Kitchen. glucose blood (ACCU-CHEK AVIVA PLUS) test strip Use as instructed  . Lancets (ACCU-CHEK SOFT TOUCH) lancets Use as instructed  . lisinopril (PRINIVIL,ZESTRIL) 5 MG tablet TAKE 1 TABLET BY MOUTH EVERY DAY  . metFORMIN (GLUCOPHAGE) 500 MG tablet TAKE 2 TABLETS (1,000 MG TOTAL) BY MOUTH 2 (TWO) TIMES DAILY.  Marland Kitchen. Vitamin D, Ergocalciferol, (DRISDOL) 50000 UNITS CAPS capsule One po every 7 days  . [DISCONTINUED] benzonatate (TESSALON) 100 MG capsule Take 1 capsule (100 mg total) by mouth 2 (two) times daily as needed for cough.  . [DISCONTINUED] levofloxacin (LEVAQUIN) 500 MG tablet Take 1 tablet (500 mg total) by mouth daily.   No facility-administered encounter medications on file as of 07/15/2014.      Review of Systems  Constitutional: Negative.   HENT: Negative.   Eyes: Negative.   Respiratory: Negative.   Negative for cough.   Cardiovascular: Negative.   Gastrointestinal: Negative.   Endocrine: Negative.   Genitourinary: Negative.   Musculoskeletal: Negative.   Skin: Negative.   Allergic/Immunologic: Negative.   Neurological: Negative.   Hematological: Negative.   Psychiatric/Behavioral: Negative.        Objective:   Physical Exam  Constitutional: He is oriented to person, place, and time. He appears well-developed and well-nourished.  HENT:  Head: Normocephalic and atraumatic.  Right Ear: External ear normal.  Left Ear: External ear normal.  Nose: Nose normal.  Mouth/Throat: Oropharynx is clear and moist. No oropharyngeal exudate.  Eyes: Conjunctivae and EOM are normal. Pupils are equal, round, and reactive to light. Right eye exhibits no discharge. Left eye exhibits no discharge. No scleral icterus.  Neck: Normal range of motion. Neck supple. No thyromegaly present.  No anterior cervical adenopathy  Cardiovascular: Normal rate, regular rhythm and normal heart sounds.  Exam reveals no gallop and no friction rub.   No murmur heard. Pulmonary/Chest: Effort normal and breath sounds normal. No respiratory distress. He has no wheezes. He has no rales. He exhibits no tenderness.  Minimal congestion with coughing  Musculoskeletal: Normal range of motion. He exhibits no edema.  Lymphadenopathy:    He has no cervical adenopathy.  Neurological: He is alert and oriented to person, place, and time.  Skin: Skin is warm and dry. No rash noted.  Psychiatric: He has a normal mood and affect. His behavior is normal. Judgment and thought content normal.  Nursing  note and vitals reviewed.  BP 108/75 mmHg  Pulse 80  Temp(Src) 97.6 F (36.4 C) (Oral)  Ht 5\' 11"  (1.803 m)  Wt 233 lb (105.688 kg)  BMI 32.51 kg/m2        Assessment & Plan:  1. Bronchitis with bronchospasm -Is much improved and he should continue good pulmonary hygiene and drink plenty of fluids  Patient Instructions    The patient should continue with plenty of fluids and avoid irritating environments He should continue to take Mucinex maximum strength 1 twice daily with a large glass of water for a couple of more weeks.   Nyra Capeson W. Moore MD

## 2014-09-26 ENCOUNTER — Other Ambulatory Visit: Payer: Self-pay | Admitting: Family Medicine

## 2014-11-30 HISTORY — PX: OTHER SURGICAL HISTORY: SHX169

## 2014-12-02 ENCOUNTER — Ambulatory Visit: Payer: BLUE CROSS/BLUE SHIELD | Admitting: Family Medicine

## 2014-12-20 ENCOUNTER — Encounter: Payer: Self-pay | Admitting: Family Medicine

## 2014-12-20 ENCOUNTER — Ambulatory Visit (INDEPENDENT_AMBULATORY_CARE_PROVIDER_SITE_OTHER): Payer: BLUE CROSS/BLUE SHIELD | Admitting: Family Medicine

## 2014-12-20 VITALS — BP 121/79 | HR 81 | Temp 98.1°F | Ht 71.0 in | Wt 237.0 lb

## 2014-12-20 DIAGNOSIS — E119 Type 2 diabetes mellitus without complications: Secondary | ICD-10-CM

## 2014-12-20 DIAGNOSIS — E785 Hyperlipidemia, unspecified: Secondary | ICD-10-CM

## 2014-12-20 DIAGNOSIS — E559 Vitamin D deficiency, unspecified: Secondary | ICD-10-CM | POA: Diagnosis not present

## 2014-12-20 DIAGNOSIS — N400 Enlarged prostate without lower urinary tract symptoms: Secondary | ICD-10-CM | POA: Diagnosis not present

## 2014-12-20 DIAGNOSIS — K219 Gastro-esophageal reflux disease without esophagitis: Secondary | ICD-10-CM | POA: Diagnosis not present

## 2014-12-20 DIAGNOSIS — I10 Essential (primary) hypertension: Secondary | ICD-10-CM

## 2014-12-20 LAB — POCT UA - MICROALBUMIN: Microalbumin Ur, POC: 20 mg/L

## 2014-12-20 NOTE — Addendum Note (Signed)
Addended by: Tommas Olp on: 12/20/2014 12:48 PM   Modules accepted: Orders

## 2014-12-20 NOTE — Patient Instructions (Addendum)
Continue current medications. Continue good therapeutic lifestyle changes which include good diet and exercise. Fall precautions discussed with patient. If an FOBT was given today- please return it to our front desk. If you are over 59 years old - you may need Prevnar 13 or the adult Pneumonia vaccine.  **Flu shots will be available soon--- please call and schedule a FLU-CLINIC appointment**  After your visit with [place] today you will receive a survey in the mail or online from [employer] regarding your care with [place]. Please take a moment to fill this out. Your feedback is very important to [place] as you can help [place] better understand your patient needs as well as improve your experience and satisfaction. WE CARE ABOUT YOU!!!    The patient should follow-up with orthopedic surgeon as planned He should make all efforts at completely stopping smoking He should return to the office fasting to get his lab work He should return the FOBT

## 2014-12-20 NOTE — Progress Notes (Signed)
Subjective:    Patient ID: Scott [name], male    DOB: 13-Sep-1955, 59 y.o.   MRN: [id number]  HPI Pt here for follow up and management of chronic medical problems which includes hypertension, hyperlipidemia, and diabetes. He is taking medications regularly. The patient has had recent surgery on the right hand. In February of this year he also had salivary gland surgery and is still recovering from that. The patient denies chest pain shortness of breath trouble swallowing and heartburn indigestion and nausea vomiting diarrhea or blood in the stool. His blood sugars at home and they're running in the 110-140 range. He is not fasting and will come back fasting for his lab work. He still wearing a brace on his right wrist and hand following this most recent hand surgery. He will be wearing this brace for at least another 5-6 weeks. He will continue to follow-up with orthopedic surgeon.      Patient Active Problem List   Diagnosis Date Noted  . Hyperlipidemia 02/12/2013  . Tobacco use disorder 01/05/2013  . Back pain, acute 12/13/2012  . Elevated blood pressure 12/04/2012  . H/O asbestosis 12/04/2012  . Erectile dysfunction 12/04/2012  . Neuropathy of both upper extremities ([place]) 12/04/2012  . Degenerative arthritis of cervical spine 12/04/2012  . BPH (benign prostatic hyperplasia) 12/04/2012  . GERD (gastroesophageal reflux disease) 06/03/2012  . Diabetes ([place]) 06/03/2012  . Generalized anxiety disorder 06/03/2012   Outpatient Encounter Prescriptions as of 12/20/2014  Medication Sig  . albuterol (PROVENTIL HFA;VENTOLIN HFA) 108 (90 BASE) MCG/ACT inhaler Inhale 2 puffs into the lungs every 6 (six) hours as needed for wheezing.  Marland Kitchen aspirin 81 MG tablet Take 1 tablet (81 mg total) by mouth daily.  Marland Kitchen glucose blood (ACCU-CHEK AVIVA PLUS) test strip Use as instructed  . Lancets (ACCU-CHEK SOFT TOUCH) lancets Use as instructed  . lisinopril (PRINIVIL,ZESTRIL) 5 MG tablet TAKE 1 TABLET BY  MOUTH EVERY DAY  . metFORMIN (GLUCOPHAGE) 500 MG tablet TAKE 2 TABLETS (1,000 MG TOTAL) BY MOUTH 2 (TWO) TIMES DAILY.  Marland Kitchen Vitamin D, Ergocalciferol, (DRISDOL) 50000 UNITS CAPS capsule One po every 7 days   No facility-administered encounter medications on file as of 12/20/2014.      Review of Systems  Constitutional: Negative.   HENT: Negative.   Eyes: Negative.   Respiratory: Negative.   Cardiovascular: Negative.   Gastrointestinal: Negative.   Endocrine: Negative.   Genitourinary: Negative.   Musculoskeletal: Negative.        Recent hand surgery - right   Skin: Negative.   Allergic/Immunologic: Negative.   Neurological: Negative.   Hematological: Negative.   Psychiatric/Behavioral: Negative.        Objective:   Physical Exam  Constitutional: He is oriented to person, place, and time. He appears well-developed and well-nourished. No distress.  HENT:  Head: Normocephalic and atraumatic.  Right Ear: External ear normal.  Left Ear: External ear normal.  Nose: Nose normal.  Mouth/Throat: Oropharynx is clear and moist. No oropharyngeal exudate.  Eyes: Conjunctivae and EOM are normal. Pupils are equal, round, and reactive to light. Right eye exhibits no discharge. Left eye exhibits no discharge. No scleral icterus.  Neck: Normal range of motion. Neck supple. No thyromegaly present.  Cardiovascular: Normal rate, regular rhythm, normal heart sounds and intact distal pulses.   No murmur heard. Pulmonary/Chest: Effort normal. No respiratory distress. He has wheezes. He has no rales. He exhibits no tenderness.  Rare wheeze with deep breathing  Abdominal: Soft. Bowel sounds are  normal. He exhibits no mass. There is no tenderness. There is no rebound and no guarding.  The abdomen is obese without masses tenderness or organ enlargement  Musculoskeletal: Normal range of motion. He exhibits no edema.  The patient has a splint with a special attachment on the middle finger of the right  hand.  Lymphadenopathy:    He has no cervical adenopathy.  Neurological: He is alert and oriented to person, place, and time. He has normal reflexes. No cranial nerve deficit.  Skin: Skin is warm and dry. No rash noted.  Psychiatric: He has a normal mood and affect. His behavior is normal. Judgment and thought content normal.  Nursing note and vitals reviewed.  BP 121/79 mmHg  Pulse 81  Temp(Src) 98.1 F (36.7 C) (Oral)  Ht 5' 11"  (1.803 m)  Wt 237 lb (107.502 kg)  BMI 33.07 kg/m2        Assessment & Plan:  1. Type 2 diabetes mellitus without complication, without long-term current use of insulin (HCC) -The patient should continue to monitor the blood sugars regularly at home and get as much physical activity as possible - POCT glycosylated hemoglobin (Hb A1C); Future - POCT UA - Microalbumin - CBC with Differential/Platelet; Future  2. Gastroesophageal reflux disease, esophagitis presence not specified -He is not having any problems with his reflux and should continue with current treatment which is basically avoidance of irritating foods. - CBC with Differential/Platelet; Future  3. BPH (benign prostatic hyperplasia) -He prefers to defer his rectal exam today - CBC with Differential/Platelet; Future  4. Hyperlipidemia -Continue aggressive therapeutic lifestyle changes pending results of lab work - CBC with Differential/Platelet; Future - NMR, lipoprofile; Future  5. Essential hypertension, benign -The blood pressure is good today and he will continue with current treatment - BMP8+EGFR; Future - CBC with Differential/Platelet; Future - Hepatic function panel; Future  6. Vitamin D deficiency -Continue vitamin D replacement pending results of lab work - CBC with Differential/Platelet; Future - Vit D  25 hydroxy (rtn osteoporosis monitoring); Future  Patient Instructions  Continue current medications. Continue good therapeutic lifestyle changes which include good  diet and exercise. Fall precautions discussed with patient. If an FOBT was given today- please return it to our front desk. If you are over 34 years old - you may need Prevnar 6 or the adult Pneumonia vaccine.  **Flu shots will be available soon--- please call and schedule a FLU-CLINIC appointment**  After your visit with [place] today you will receive a survey in the mail or online from [employer] regarding your care with [place]. Please take a moment to fill this out. Your feedback is very important to [place] as you can help [place] better understand your patient needs as well as improve your experience and satisfaction. WE CARE ABOUT YOU!!!    The patient should follow-up with orthopedic surgeon as planned He should make all efforts at completely stopping smoking He should return to the office fasting to get his lab work He should return the FOBT   Arrie Senate MD

## 2014-12-21 LAB — MICROALBUMIN, URINE: MICROALBUM., U, RANDOM: 7.4 ug/mL

## 2014-12-26 ENCOUNTER — Other Ambulatory Visit: Payer: Self-pay | Admitting: Family Medicine

## 2014-12-30 ENCOUNTER — Other Ambulatory Visit (INDEPENDENT_AMBULATORY_CARE_PROVIDER_SITE_OTHER): Payer: BLUE CROSS/BLUE SHIELD

## 2014-12-30 ENCOUNTER — Ambulatory Visit (INDEPENDENT_AMBULATORY_CARE_PROVIDER_SITE_OTHER): Payer: BLUE CROSS/BLUE SHIELD

## 2014-12-30 DIAGNOSIS — E119 Type 2 diabetes mellitus without complications: Secondary | ICD-10-CM

## 2014-12-30 DIAGNOSIS — Z23 Encounter for immunization: Secondary | ICD-10-CM | POA: Diagnosis not present

## 2014-12-30 LAB — POCT GLYCOSYLATED HEMOGLOBIN (HGB A1C): HEMOGLOBIN A1C: 6.6

## 2014-12-30 NOTE — Progress Notes (Signed)
Lab only 

## 2014-12-31 LAB — CBC WITH DIFFERENTIAL/PLATELET
Basophils Absolute: 0 10*3/uL (ref 0.0–0.2)
Basos: 1 %
EOS (ABSOLUTE): 0.3 10*3/uL (ref 0.0–0.4)
EOS: 4 %
HEMATOCRIT: 43.5 % (ref 37.5–51.0)
Hemoglobin: 15 g/dL (ref 12.6–17.7)
IMMATURE GRANULOCYTES: 0 %
Immature Grans (Abs): 0 10*3/uL (ref 0.0–0.1)
LYMPHS: 35 %
Lymphocytes Absolute: 2.7 10*3/uL (ref 0.7–3.1)
MCH: 32.1 pg (ref 26.6–33.0)
MCHC: 34.5 g/dL (ref 31.5–35.7)
MCV: 93 fL (ref 79–97)
MONOCYTES: 6 %
MONOS ABS: 0.5 10*3/uL (ref 0.1–0.9)
Neutrophils Absolute: 4.2 10*3/uL (ref 1.4–7.0)
Neutrophils: 54 %
Platelets: 254 10*3/uL (ref 150–379)
RBC: 4.67 x10E6/uL (ref 4.14–5.80)
RDW: 13.5 % (ref 12.3–15.4)
WBC: 7.8 10*3/uL (ref 3.4–10.8)

## 2014-12-31 LAB — BMP8+EGFR
BUN/Creatinine Ratio: 15 (ref 9–20)
BUN: 13 mg/dL (ref 6–24)
CALCIUM: 9.3 mg/dL (ref 8.7–10.2)
CO2: 23 mmol/L (ref 18–29)
Chloride: 97 mmol/L (ref 97–106)
Creatinine, Ser: 0.86 mg/dL (ref 0.76–1.27)
GFR calc Af Amer: 110 mL/min/{1.73_m2} (ref >59)
GFR calc non Af Amer: 95 mL/min/{1.73_m2} (ref >59)
GLUCOSE: 169 mg/dL — AB (ref 65–99)
Potassium: 4.8 mmol/L (ref 3.5–5.2)
SODIUM: 136 mmol/L (ref 136–144)

## 2014-12-31 LAB — HEPATIC FUNCTION PANEL
ALT: 25 IU/L (ref 0–44)
AST: 15 IU/L (ref 0–40)
Albumin: 4.6 g/dL (ref 3.5–5.5)
Alkaline Phosphatase: 62 IU/L (ref 39–117)
BILIRUBIN TOTAL: 0.5 mg/dL (ref 0.0–1.2)
Bilirubin, Direct: 0.14 mg/dL (ref 0.00–0.40)
Total Protein: 7 g/dL (ref 6.0–8.5)

## 2014-12-31 LAB — LIPID PANEL
CHOLESTEROL TOTAL: 175 mg/dL (ref 100–199)
Chol/HDL Ratio: 3.4 ratio units (ref 0.0–5.0)
HDL: 52 mg/dL (ref >39)
LDL Calculated: 102 mg/dL — ABNORMAL HIGH (ref 0–99)
TRIGLYCERIDES: 106 mg/dL (ref 0–149)
VLDL Cholesterol Cal: 21 mg/dL (ref 5–40)

## 2014-12-31 LAB — VITAMIN D 25 HYDROXY (VIT D DEFICIENCY, FRACTURES): VIT D 25 HYDROXY: 35.3 ng/mL (ref 30.0–100.0)

## 2015-01-14 ENCOUNTER — Other Ambulatory Visit: Payer: Self-pay | Admitting: Family Medicine

## 2015-01-14 NOTE — Telephone Encounter (Signed)
Level 35.3 on 12/30/14

## 2015-01-17 ENCOUNTER — Ambulatory Visit: Payer: BLUE CROSS/BLUE SHIELD | Admitting: Family Medicine

## 2015-02-13 ENCOUNTER — Other Ambulatory Visit: Payer: Self-pay | Admitting: Family Medicine

## 2015-03-22 ENCOUNTER — Ambulatory Visit (INDEPENDENT_AMBULATORY_CARE_PROVIDER_SITE_OTHER): Payer: BLUE CROSS/BLUE SHIELD | Admitting: Family Medicine

## 2015-03-22 ENCOUNTER — Encounter: Payer: Self-pay | Admitting: Family Medicine

## 2015-03-22 VITALS — BP 122/65 | HR 97 | Temp 96.8°F | Ht 71.0 in | Wt 254.0 lb

## 2015-03-22 DIAGNOSIS — R109 Unspecified abdominal pain: Secondary | ICD-10-CM | POA: Diagnosis not present

## 2015-03-22 DIAGNOSIS — M549 Dorsalgia, unspecified: Secondary | ICD-10-CM | POA: Diagnosis not present

## 2015-03-22 DIAGNOSIS — E119 Type 2 diabetes mellitus without complications: Secondary | ICD-10-CM | POA: Diagnosis not present

## 2015-03-22 LAB — POCT URINALYSIS DIPSTICK
Bilirubin, UA: NEGATIVE
Glucose, UA: NEGATIVE
Ketones, UA: NEGATIVE
LEUKOCYTES UA: NEGATIVE
Nitrite, UA: NEGATIVE
PROTEIN UA: NEGATIVE
Spec Grav, UA: 1.02
Urobilinogen, UA: NEGATIVE
pH, UA: 5

## 2015-03-22 LAB — POCT UA - MICROSCOPIC ONLY
BACTERIA, U MICROSCOPIC: NEGATIVE
CASTS, UR, LPF, POC: NEGATIVE
Crystals, Ur, HPF, POC: NEGATIVE
Mucus, UA: NEGATIVE
WBC, Ur, HPF, POC: NEGATIVE
Yeast, UA: NEGATIVE

## 2015-03-22 MED ORDER — ONETOUCH LANCETS MISC: Status: DC

## 2015-03-22 MED ORDER — GLUCOSE BLOOD VI STRP: ORAL_STRIP | Status: DC

## 2015-03-22 NOTE — Progress Notes (Signed)
   Subjective:    Patient ID: Scott [name], male    DOB: [birth year], 60 y.o.   MRN: [id number]  HPI 60 year old gentleman with some right flank pain. He has had similar symptoms in the past that were attributed to diabetes and hyperglycemia. Over the holidays he gained a fair amount of weight and his sugars are elevated. Diabetes treatment includes metformin 1000 mg twice a day. He denies any frequency or dysuria. He denies pain made worse by movement.    Review of Systems  Constitutional: Negative.   HENT: Negative.   Genitourinary: Positive for flank pain.  Musculoskeletal: Negative.   Neurological: Negative.   Psychiatric/Behavioral: Negative.       BP 122/65 mmHg  Pulse 97  Temp(Src) 96.8 F (36 C) (Oral)  Ht 5\' 11"  (1.803 m)  Wt 254 lb (115.214 kg)  BMI 35.44 kg/m2  Objective:   Physical Exam  Constitutional: He is oriented to person, place, and time. He appears well-developed and well-nourished.  Cardiovascular: Normal rate and regular rhythm.   Pulmonary/Chest: Effort normal and breath sounds normal.  Abdominal: Soft.  Neurological: He is alert and oriented to person, place, and time.          Assessment & Plan:  1. Right flank pain Urinalysis is not remarkable. This may be related to poor diabetic control but I would like to obtain renal ultrasound to be sure we are not overlooking some other problem - POCT urinalysis dipstick - POCT UA - Microscopic Only - US Renal; Future  2. Type 2 diabetes mellitus without complication, without long-term current use of insulin (HCC) Will add Invokanna 100 mg to metformin. Patient to monitor sugar and recheck in about 2 weeks  Frederica KusterStephen M Miller MD

## 2015-03-23 ENCOUNTER — Telehealth: Payer: Self-pay | Admitting: Family Medicine

## 2015-03-23 DIAGNOSIS — R1031 Right lower quadrant pain: Secondary | ICD-10-CM

## 2015-03-23 DIAGNOSIS — R109 Unspecified abdominal pain: Secondary | ICD-10-CM

## 2015-03-24 ENCOUNTER — Other Ambulatory Visit: Payer: Self-pay

## 2015-03-24 DIAGNOSIS — R1030 Lower abdominal pain, unspecified: Secondary | ICD-10-CM

## 2015-03-24 NOTE — Telephone Encounter (Signed)
Pt aware we will schedule

## 2015-03-24 NOTE — Telephone Encounter (Signed)
Go ahead and order Ct abd/ pelvis with and without contrast. Please forward results to Dr Hyacinth MeekerMiller when they come in. Arville CareJoshua Dettinger, MD [facility] 03/24/2015, 12:00 PM

## 2015-03-24 NOTE — Telephone Encounter (Signed)
Pt calls and says that he is still having the pain  - yesterday it was lower right -groin area -- today moving up some still Right side. His sugars are all "out of wack" - Miller added med yesterday  - pt thinks sugar is up due to what ever is going on.  He failed to mentioned yesterday that he did have a US ABD last march that showed multiple gallstones. Could that be whats going on?  He is having a US renal tomorrow - Per Carlon - we may should add US abd/pel to the order - or maybe even do a CT abd instead   You are coverage for [employer] tomorrow - will you please help me with addressing this.

## 2015-03-25 ENCOUNTER — Inpatient Hospital Stay: Admission: RE | Admit: 2015-03-25 | Payer: BLUE CROSS/BLUE SHIELD | Source: Ambulatory Visit

## 2015-03-29 ENCOUNTER — Encounter (INDEPENDENT_AMBULATORY_CARE_PROVIDER_SITE_OTHER): Payer: Self-pay

## 2015-03-29 ENCOUNTER — Ambulatory Visit
Admission: RE | Admit: 2015-03-29 | Discharge: 2015-03-29 | Disposition: A | Discharge status: Home / Self Care | Payer: BLUE CROSS/BLUE SHIELD | Source: Ambulatory Visit | Attending: Physician Assistant | Admitting: Physician Assistant

## 2015-03-29 ENCOUNTER — Inpatient Hospital Stay: Admission: RE | Admit: 2015-03-29 | Payer: BLUE CROSS/BLUE SHIELD | Source: Ambulatory Visit

## 2015-03-29 ENCOUNTER — Other Ambulatory Visit: Payer: Self-pay | Admitting: Family Medicine

## 2015-03-29 DIAGNOSIS — R1031 Right lower quadrant pain: Secondary | ICD-10-CM

## 2015-03-29 DIAGNOSIS — R109 Unspecified abdominal pain: Secondary | ICD-10-CM

## 2015-03-29 MED ORDER — IOPAMIDOL (ISOVUE-300) INJECTION 61%
125.0000 mL | Freq: Once | INTRAVENOUS | Status: AC | PRN
  Administered 2015-03-29: 125 mL via INTRAVENOUS

## 2015-04-05 ENCOUNTER — Telehealth: Payer: Self-pay | Admitting: Family Medicine

## 2015-04-06 ENCOUNTER — Other Ambulatory Visit: Payer: Self-pay | Admitting: *Deleted

## 2015-04-06 MED ORDER — CANAGLIFLOZIN 100 MG PO TABS
100.0000 mg | ORAL_TABLET | Freq: Every day | ORAL | Status: DC
Start: 2015-04-06 — End: 2015-05-09

## 2015-04-06 NOTE — Telephone Encounter (Signed)
Pt rx sent in

## 2015-04-27 ENCOUNTER — Other Ambulatory Visit: Payer: Self-pay

## 2015-04-27 MED ORDER — METFORMIN HCL 500 MG PO TABS: ORAL_TABLET | ORAL | Status: DC

## 2015-05-09 ENCOUNTER — Ambulatory Visit (INDEPENDENT_AMBULATORY_CARE_PROVIDER_SITE_OTHER): Payer: BLUE CROSS/BLUE SHIELD | Admitting: Family Medicine

## 2015-05-09 ENCOUNTER — Encounter: Payer: Self-pay | Admitting: Family Medicine

## 2015-05-09 VITALS — BP 122/79 | HR 82 | Temp 97.9°F | Ht 71.0 in | Wt 243.0 lb

## 2015-05-09 DIAGNOSIS — I10 Essential (primary) hypertension: Secondary | ICD-10-CM

## 2015-05-09 DIAGNOSIS — K219 Gastro-esophageal reflux disease without esophagitis: Secondary | ICD-10-CM

## 2015-05-09 DIAGNOSIS — E559 Vitamin D deficiency, unspecified: Secondary | ICD-10-CM

## 2015-05-09 DIAGNOSIS — N400 Enlarged prostate without lower urinary tract symptoms: Secondary | ICD-10-CM | POA: Diagnosis not present

## 2015-05-09 DIAGNOSIS — M47812 Spondylosis without myelopathy or radiculopathy, cervical region: Secondary | ICD-10-CM | POA: Diagnosis not present

## 2015-05-09 DIAGNOSIS — R1310 Dysphagia, unspecified: Secondary | ICD-10-CM

## 2015-05-09 DIAGNOSIS — E785 Hyperlipidemia, unspecified: Secondary | ICD-10-CM

## 2015-05-09 DIAGNOSIS — E119 Type 2 diabetes mellitus without complications: Secondary | ICD-10-CM | POA: Diagnosis not present

## 2015-05-09 DIAGNOSIS — G5693 Unspecified mononeuropathy of bilateral upper limbs: Secondary | ICD-10-CM

## 2015-05-09 LAB — POCT URINALYSIS DIPSTICK
Bilirubin, UA: NEGATIVE
KETONES UA: NEGATIVE
Leukocytes, UA: NEGATIVE
Nitrite, UA: NEGATIVE
RBC UA: NEGATIVE
SPEC GRAV UA: 1.015
Urobilinogen, UA: NEGATIVE
pH, UA: 5

## 2015-05-09 LAB — POCT UA - MICROSCOPIC ONLY
Bacteria, U Microscopic: NEGATIVE
CASTS, UR, LPF, POC: NEGATIVE
CRYSTALS, UR, HPF, POC: NEGATIVE
EPITHELIAL CELLS, URINE PER MICROSCOPY: NEGATIVE
RBC, URINE, MICROSCOPIC: NEGATIVE
WBC, Ur, HPF, POC: NEGATIVE
Yeast, UA: NEGATIVE

## 2015-05-09 LAB — POCT GLYCOSYLATED HEMOGLOBIN (HGB A1C): Hemoglobin A1C: 7.3

## 2015-05-09 MED ORDER — LISINOPRIL 5 MG PO TABS: 5.0000 mg | ORAL_TABLET | Freq: Every day | ORAL | Status: DC

## 2015-05-09 MED ORDER — CANAGLIFLOZIN 100 MG PO TABS: 100.0000 mg | ORAL_TABLET | Freq: Every day | ORAL | Status: DC

## 2015-05-09 MED ORDER — METFORMIN HCL 500 MG PO TABS: ORAL_TABLET | ORAL | Status: DC

## 2015-05-09 MED ORDER — VITAMIN D (ERGOCALCIFEROL) 1.25 MG (50000 UNIT) PO CAPS: ORAL_CAPSULE | ORAL | Status: DC

## 2015-05-09 NOTE — Patient Instructions (Addendum)
Continue current medications. Continue good therapeutic lifestyle changes which include good diet and exercise. Fall precautions discussed with patient. If an FOBT was given today- please return it to our front desk. If you are over 60 years old - you may need Prevnar 13 or the adult Pneumonia vaccine.  **Flu shots are available--- please call and schedule a FLU-CLINIC appointment**  After your visit with [place] today you will receive a survey in the mail or online from [employer] regarding your care with [place]. Please take a moment to fill this out. Your feedback is very important to [place] as you can help [place] better understand your patient needs as well as improve your experience and satisfaction. WE CARE ABOUT YOU!!!   The patient should make every effort possible to drink more fluids and get more exercise and work on his weight. Because of the swallowing difficulty he will be scheduled for an appointment to see the gastroenterologist for possible endoscopy and esophageal dilatation Continue to refrain from smoking and you are to be applauded for this. Please return the FOBT Please continue to monitor blood sugars at home and bring these readings to the next visit

## 2015-05-09 NOTE — Progress Notes (Signed)
Subjective:    Patient ID: Scott [name], male    DOB: Jun 11, 1955, 60 y.o.   MRN: [id number]  HPI Patient is here today for annual wellness exam and follow up of chronic medical problems which includes diabetes, hypertension and hyperlipidemia. He is taking medications regularly. The patient indicates his blood sugars have been running higher than usual. He also complains of some visual issues. The blood sugar was greater than 200 this morning at home. It is significant to note that he quit smoking in November 2016 and this is due to his first grandchild being born. The patient denies chest pain or shortness of breath. He has stopped smoking and he attributes this to his weight gain. He is drinking less beer. He does say that when he swallows especially chicken and rice that it seems to want to hold up in his throat and does not want to go down and he is actually had to leave restaurants at times to clear his throat. He denies any heartburn abdominal pain blood in the stool or black tarry bowel movements. He is passing his water without problems. The urine today was clear and his A1c was 7.3%. He is aware that he has gallstones but they are not giving him any trouble at the present time. His blood sugars have been running in the 100-200 range and if he is more active they trended be lower if they're higher and she is a when he is been less active.     Patient Active Problem List   Diagnosis Date Noted  . Hyperlipidemia 02/12/2013  . Tobacco use disorder 01/05/2013  . Back pain, acute 12/13/2012  . Elevated blood pressure 12/04/2012  . H/O asbestosis 12/04/2012  . Erectile dysfunction 12/04/2012  . Neuropathy of both upper extremities ([place]) 12/04/2012  . Degenerative arthritis of cervical spine 12/04/2012  . BPH (benign prostatic hyperplasia) 12/04/2012  . GERD (gastroesophageal reflux disease) 06/03/2012  . Diabetes ([place]) 06/03/2012  . Generalized anxiety disorder 06/03/2012    Outpatient Encounter Prescriptions as of 05/09/2015  Medication Sig  . albuterol (PROVENTIL HFA;VENTOLIN HFA) 108 (90 BASE) MCG/ACT inhaler Inhale 2 puffs into the lungs every 6 (six) hours as needed for wheezing.  Marland Kitchen aspirin 81 MG tablet Take 1 tablet (81 mg total) by mouth daily.  . canagliflozin (INVOKANA) 100 MG TABS tablet Take 1 tablet (100 mg total) by mouth daily before breakfast.  . cycloSPORINE (RESTASIS) 0.05 % ophthalmic emulsion 1 drop 2 (two) times daily.  Marland Kitchen glucose blood test strip Test blood sugar BID dx 250.02  . lisinopril (PRINIVIL,ZESTRIL) 5 MG tablet TAKE 1 TABLET BY MOUTH EVERY DAY  . metFORMIN (GLUCOPHAGE) 500 MG tablet TAKE 2 TABLETS (1,000 MG TOTAL) BY MOUTH 2 (TWO) TIMES DAILY.  . ONE TOUCH LANCETS MISC Check blood sugar twice daily DX 250.02  . Vitamin D, Ergocalciferol, (DRISDOL) 50000 UNITS CAPS capsule TAKE ONE CAPSULE EVERY SEVEN DAYS   No facility-administered encounter medications on file as of 05/09/2015.      Review of Systems  Constitutional: Negative.   HENT: Negative.   Eyes: Positive for visual disturbance ("hard to focus").  Respiratory: Negative.   Cardiovascular: Negative.   Gastrointestinal: Negative.   Endocrine: Negative.        Elevated BS   Genitourinary: Negative.   Musculoskeletal: Negative.   Skin: Negative.   Allergic/Immunologic: Negative.   Neurological: Negative.   Hematological: Negative.   Psychiatric/Behavioral: Negative.        Objective:   Physical Exam  Constitutional: He is oriented to person, place, and time. He appears well-developed and well-nourished.  HENT:  Head: Normocephalic and atraumatic.  Right Ear: External ear normal.  Left Ear: External ear normal.  Nose: Nose normal.  Mouth/Throat: Oropharynx is clear and moist. No oropharyngeal exudate.  Eyes: Conjunctivae and EOM are normal. Pupils are equal, round, and reactive to light. Right eye exhibits no discharge. Left eye exhibits no discharge. No  scleral icterus.  Neck: Normal range of motion. Neck supple. No thyromegaly present.  Neck without bruits or thyromegaly  Cardiovascular: Normal rate, regular rhythm, normal heart sounds and intact distal pulses.   No murmur heard. At 72/m with a regular rate and rhythm  Pulmonary/Chest: Effort normal and breath sounds normal. No respiratory distress. He has no wheezes. He has no rales. He exhibits no tenderness.  Clear anteriorly and posteriorly and no axillary adenopathy  Abdominal: Soft. Bowel sounds are normal. He exhibits no mass. There is tenderness. There is no rebound and no guarding.  Abdomen is obese and patient promises to make more efforts at exercise and weight loss. There are no masses or spleen or liver enlargement. The abdomen was slightly tender in the right upper quadrant. Inguinal area was free of adenopathy. There were no bruits.  Genitourinary: Rectum normal and penis normal.  The prostate was enlarged but soft and smooth.  Musculoskeletal: He exhibits no edema or tenderness.  The patient has limited range of motion of both arms with the inability to raise them much more above horizontal because of severe cervical disc disease. He has seen the neurosurgeon about this and no surgery was recommended.  Lymphadenopathy:    He has no cervical adenopathy.  Neurological: He is alert and oriented to person, place, and time. He has normal reflexes. No cranial nerve deficit.  Skin: Skin is warm and dry. No rash noted.  Nail fungus on multiple toes of both feet  Psychiatric: He has a normal mood and affect. His behavior is normal. Judgment and thought content normal.  Nursing note and vitals reviewed.  BP 122/79 mmHg  Pulse 82  Temp(Src) 97.9 F (36.6 C) (Oral)  Ht 5' 11"  (1.803 m)  Wt 243 lb (110.224 kg)  BMI 33.91 kg/m2  Results for orders placed or performed in visit on 05/09/15  POCT glycosylated hemoglobin (Hb A1C)  Result Value Ref Range   Hemoglobin A1C 7.3   POCT  UA - Microscopic Only  Result Value Ref Range   WBC, Ur, HPF, POC neg    RBC, urine, microscopic neg    Bacteria, U Microscopic neg    Mucus, UA few    Epithelial cells, urine per micros neg    Crystals, Ur, HPF, POC neg    Casts, Ur, LPF, POC neg    Yeast, UA neg   POCT urinalysis dipstick  Result Value Ref Range   Color, UA gold    Clarity, UA clear    Glucose, UA 4+    Bilirubin, UA neg    Ketones, UA neg    Spec Grav, UA 1.015    Blood, UA neg    pH, UA 5.0    Protein, UA trace    Urobilinogen, UA negative    Nitrite, UA neg    Leukocytes, UA Negative Negative         Assessment & Plan:  1. Type 2 diabetes mellitus without complication, without long-term current use of insulin ([place]) -The patient admits to higher blood sugars at home in  the 100-200 range. He understands that more activity reduces the blood sugar and works better for his weight and he is encouraged to follow his diet more closely and get his blood sugars under better control and get his weight down. - POCT glycosylated hemoglobin (Hb A1C) - BMP8+EGFR - CBC with Differential/Platelet  2. Gastroesophageal reflux disease, esophagitis presence not specified -He denies any reflux but is having problems with swallowing certain foods especially chicken and rice. He will be referred back to his gastroenterologist for possible endoscopy and dilatation. - CBC with Differential/Platelet  3. BPH (benign prostatic hyperplasia) -The prostate is enlarged but smooth and he is having no specific symptoms with this. - CBC with Differential/Platelet - POCT UA - Microscopic Only - POCT urinalysis dipstick - PSA, total and free  4. Hyperlipidemia -He will continue with aggressive pending results of lab work - CBC with Differential/Platelet - NMR, lipoprofile  5. Essential hypertension, benign -Blood pressure is good today and he will continue with current treatment  - BMP8+EGFR - CBC with Differential/Platelet -  Hepatic function panel  6. Vitamin D deficiency -Continue with current treatment pending results of lab work - CBC with Differential/Platelet - VITAMIN D 25 Hydroxy (Vit-D Deficiency, Fractures)  7. Neuropathy of both upper extremities (HCC) -Continue  when necessary follow-up with neurosurgery   8. Degenerative arthritis of cervical spine -This is severe at laser wall with the use of both arms to any major extent.   9. Trouble swallowing -He will be referred back to his gastroenterologist to further evaluate this.  - Ambulatory referral to Gastroenterology  Meds ordered this encounter  Medications  . cycloSPORINE (RESTASIS) 0.05 % ophthalmic emulsion    Sig: 1 drop 2 (two) times daily.  . canagliflozin (INVOKANA) 100 MG TABS tablet    Sig: Take 1 tablet (100 mg total) by mouth daily before breakfast.    Dispense:  30 tablet    Refill:  11  . lisinopril (PRINIVIL,ZESTRIL) 5 MG tablet    Sig: Take 1 tablet (5 mg total) by mouth daily.    Dispense:  30 tablet    Refill:  11  . metFORMIN (GLUCOPHAGE) 500 MG tablet    Sig: TAKE 2 TABLETS (1,000 MG TOTAL) BY MOUTH 2 (TWO) TIMES DAILY.    Dispense:  360 tablet    Refill:  2  . Vitamin D, Ergocalciferol, (DRISDOL) 50000 units CAPS capsule    Sig: TAKE ONE CAPSULE EVERY SEVEN DAYS    Dispense:  12 capsule    Refill:  1   Patient Instructions  Continue current medications. Continue good therapeutic lifestyle changes which include good diet and exercise. Fall precautions discussed with patient. If an FOBT was given today- please return it to our front desk. If you are over 82 years old - you may need Prevnar 39 or the adult Pneumonia vaccine.  **Flu shots are available--- please call and schedule a FLU-CLINIC appointment**  After your visit with [place] today you will receive a survey in the mail or online from [employer] regarding your care with [place]. Please take a moment to fill this out. Your feedback is very important to [place] as you  can help [place] better understand your patient needs as well as improve your experience and satisfaction. WE CARE ABOUT YOU!!!   The patient should make every effort possible to drink more fluids and get more exercise and work on his weight. Because of the swallowing difficulty he will be scheduled for an appointment to  see the gastroenterologist for possible endoscopy and esophageal dilatation Continue to refrain from smoking and you are to be applauded for this. Please return the FOBT Please continue to monitor blood sugars at home and bring these readings to the next visit   Arrie Senate MD

## 2015-05-10 ENCOUNTER — Other Ambulatory Visit: Payer: Self-pay | Admitting: *Deleted

## 2015-05-10 DIAGNOSIS — E785 Hyperlipidemia, unspecified: Secondary | ICD-10-CM

## 2015-05-10 LAB — CBC WITH DIFFERENTIAL/PLATELET
BASOS ABS: 0.1 10*3/uL (ref 0.0–0.2)
BASOS: 1 %
EOS (ABSOLUTE): 0.3 10*3/uL (ref 0.0–0.4)
Eos: 4 %
Hematocrit: 45.8 % (ref 37.5–51.0)
Hemoglobin: 15.1 g/dL (ref 12.6–17.7)
IMMATURE GRANS (ABS): 0 10*3/uL (ref 0.0–0.1)
Immature Granulocytes: 0 %
LYMPHS ABS: 2.3 10*3/uL (ref 0.7–3.1)
Lymphs: 33 %
MCH: 31.2 pg (ref 26.6–33.0)
MCHC: 33 g/dL (ref 31.5–35.7)
MCV: 95 fL (ref 79–97)
Monocytes Absolute: 0.5 10*3/uL (ref 0.1–0.9)
Monocytes: 8 %
NEUTROS ABS: 3.6 10*3/uL (ref 1.4–7.0)
Neutrophils: 54 %
PLATELETS: 257 10*3/uL (ref 150–379)
RBC: 4.84 x10E6/uL (ref 4.14–5.80)
RDW: 13.1 % (ref 12.3–15.4)
WBC: 6.8 10*3/uL (ref 3.4–10.8)

## 2015-05-10 LAB — BMP8+EGFR
BUN / CREAT RATIO: 17 (ref 9–20)
BUN: 17 mg/dL (ref 6–24)
CHLORIDE: 99 mmol/L (ref 96–106)
CO2: 20 mmol/L (ref 18–29)
CREATININE: 1.03 mg/dL (ref 0.76–1.27)
Calcium: 9.1 mg/dL (ref 8.7–10.2)
GFR calc non Af Amer: 79 mL/min/{1.73_m2} (ref >59)
GFR, EST AFRICAN AMERICAN: 91 mL/min/{1.73_m2} (ref >59)
GLUCOSE: 165 mg/dL — AB (ref 65–99)
POTASSIUM: 4.8 mmol/L (ref 3.5–5.2)
SODIUM: 136 mmol/L (ref 134–144)

## 2015-05-10 LAB — NMR, LIPOPROFILE
CHOLESTEROL: 199 mg/dL (ref 100–199)
HDL Cholesterol by NMR: 34 mg/dL — ABNORMAL LOW (ref >39)
HDL Particle Number: 31 umol/L (ref >=30.5)
LDL PARTICLE NUMBER: 1617 nmol/L — AB (ref <1000)
LDL SIZE: 19.5 nm (ref >20.5)
LP-IR SCORE: 73 — AB (ref <=45)
Small LDL Particle Number: 1389 nmol/L — ABNORMAL HIGH (ref <=527)
Triglycerides by NMR: 446 mg/dL — ABNORMAL HIGH (ref 0–149)

## 2015-05-10 LAB — PSA, TOTAL AND FREE
PROSTATE SPECIFIC AG, SERUM: 0.3 ng/mL (ref 0.0–4.0)
PSA FREE PCT: 26.7 %
PSA FREE: 0.08 ng/mL

## 2015-05-10 LAB — HEPATIC FUNCTION PANEL
ALK PHOS: 70 IU/L (ref 39–117)
ALT: 29 IU/L (ref 0–44)
AST: 14 IU/L (ref 0–40)
Albumin: 4.6 g/dL (ref 3.5–5.5)
BILIRUBIN, DIRECT: 0.14 mg/dL (ref 0.00–0.40)
Bilirubin Total: 0.5 mg/dL (ref 0.0–1.2)
TOTAL PROTEIN: 7.3 g/dL (ref 6.0–8.5)

## 2015-05-10 LAB — VITAMIN D 25 HYDROXY (VIT D DEFICIENCY, FRACTURES): Vit D, 25-Hydroxy: 29.5 ng/mL — ABNORMAL LOW (ref 30.0–100.0)

## 2015-05-10 MED ORDER — FENOFIBRATE 160 MG PO TABS: 160.0000 mg | ORAL_TABLET | Freq: Every day | ORAL | Status: DC

## 2015-05-18 ENCOUNTER — Ambulatory Visit: Payer: BLUE CROSS/BLUE SHIELD | Admitting: Pharmacist

## 2015-05-19 ENCOUNTER — Encounter: Payer: Self-pay | Admitting: Family Medicine

## 2015-05-24 ENCOUNTER — Other Ambulatory Visit: Payer: BLUE CROSS/BLUE SHIELD

## 2015-05-24 DIAGNOSIS — Z1211 Encounter for screening for malignant neoplasm of colon: Secondary | ICD-10-CM

## 2015-05-25 LAB — FECAL OCCULT BLOOD, IMMUNOCHEMICAL: FECAL OCCULT BLD: NEGATIVE

## 2015-06-01 ENCOUNTER — Other Ambulatory Visit: Payer: Self-pay | Admitting: Gastroenterology

## 2015-06-01 DIAGNOSIS — R1310 Dysphagia, unspecified: Secondary | ICD-10-CM

## 2015-06-03 ENCOUNTER — Ambulatory Visit
Admission: RE | Admit: 2015-06-03 | Discharge: 2015-06-03 | Disposition: A | Discharge status: Home / Self Care | Payer: BLUE CROSS/BLUE SHIELD | Source: Ambulatory Visit | Attending: Gastroenterology | Admitting: Gastroenterology

## 2015-06-03 DIAGNOSIS — R1310 Dysphagia, unspecified: Secondary | ICD-10-CM

## 2015-08-16 DIAGNOSIS — E119 Type 2 diabetes mellitus without complications: Secondary | ICD-10-CM | POA: Diagnosis not present

## 2015-08-16 DIAGNOSIS — H40033 Anatomical narrow angle, bilateral: Secondary | ICD-10-CM | POA: Diagnosis not present

## 2015-08-16 LAB — HM DIABETES EYE EXAM

## 2015-08-26 ENCOUNTER — Other Ambulatory Visit: Payer: Self-pay | Admitting: *Deleted

## 2015-08-26 ENCOUNTER — Other Ambulatory Visit: Payer: Self-pay | Admitting: Family Medicine

## 2015-08-26 MED ORDER — METFORMIN HCL 500 MG PO TABS: ORAL_TABLET | ORAL | Status: DC

## 2015-08-29 DIAGNOSIS — G44219 Episodic tension-type headache, not intractable: Secondary | ICD-10-CM | POA: Diagnosis not present

## 2015-09-12 ENCOUNTER — Ambulatory Visit: Payer: BLUE CROSS/BLUE SHIELD | Admitting: Family Medicine

## 2015-10-03 ENCOUNTER — Other Ambulatory Visit: Payer: BLUE CROSS/BLUE SHIELD

## 2015-10-03 DIAGNOSIS — N400 Enlarged prostate without lower urinary tract symptoms: Secondary | ICD-10-CM | POA: Diagnosis not present

## 2015-10-03 DIAGNOSIS — E119 Type 2 diabetes mellitus without complications: Secondary | ICD-10-CM | POA: Diagnosis not present

## 2015-10-03 DIAGNOSIS — I10 Essential (primary) hypertension: Secondary | ICD-10-CM | POA: Diagnosis not present

## 2015-10-03 DIAGNOSIS — E559 Vitamin D deficiency, unspecified: Secondary | ICD-10-CM | POA: Diagnosis not present

## 2015-10-03 DIAGNOSIS — K219 Gastro-esophageal reflux disease without esophagitis: Secondary | ICD-10-CM | POA: Diagnosis not present

## 2015-10-03 DIAGNOSIS — E785 Hyperlipidemia, unspecified: Secondary | ICD-10-CM

## 2015-10-03 LAB — BAYER DCA HB A1C WAIVED: HB A1C: 9.1 % — AB (ref <7.0)

## 2015-10-04 ENCOUNTER — Telehealth: Payer: Self-pay | Admitting: Family Medicine

## 2015-10-04 LAB — CBC WITH DIFFERENTIAL/PLATELET
BASOS ABS: 0.1 10*3/uL (ref 0.0–0.2)
BASOS: 1 %
EOS (ABSOLUTE): 0.4 10*3/uL (ref 0.0–0.4)
Eos: 6 %
Hematocrit: 46.2 % (ref 37.5–51.0)
Hemoglobin: 15.2 g/dL (ref 12.6–17.7)
IMMATURE GRANULOCYTES: 0 %
Immature Grans (Abs): 0 10*3/uL (ref 0.0–0.1)
Lymphocytes Absolute: 2.8 10*3/uL (ref 0.7–3.1)
Lymphs: 38 %
MCH: 30 pg (ref 26.6–33.0)
MCHC: 32.9 g/dL (ref 31.5–35.7)
MCV: 91 fL (ref 79–97)
MONOS ABS: 0.5 10*3/uL (ref 0.1–0.9)
Monocytes: 6 %
NEUTROS PCT: 49 %
Neutrophils Absolute: 3.7 10*3/uL (ref 1.4–7.0)
PLATELETS: 253 10*3/uL (ref 150–379)
RBC: 5.07 x10E6/uL (ref 4.14–5.80)
RDW: 13.8 % (ref 12.3–15.4)
WBC: 7.5 10*3/uL (ref 3.4–10.8)

## 2015-10-04 LAB — HEPATIC FUNCTION PANEL
ALK PHOS: 61 IU/L (ref 39–117)
ALT: 31 IU/L (ref 0–44)
AST: 19 IU/L (ref 0–40)
Albumin: 4.6 g/dL (ref 3.6–4.8)
BILIRUBIN, DIRECT: 0.15 mg/dL (ref 0.00–0.40)
Bilirubin Total: 0.5 mg/dL (ref 0.0–1.2)
Total Protein: 7.4 g/dL (ref 6.0–8.5)

## 2015-10-04 LAB — BMP8+EGFR
BUN/Creatinine Ratio: 16 (ref 10–24)
BUN: 17 mg/dL (ref 8–27)
CALCIUM: 9.4 mg/dL (ref 8.6–10.2)
CHLORIDE: 98 mmol/L (ref 96–106)
CO2: 23 mmol/L (ref 18–29)
Creatinine, Ser: 1.08 mg/dL (ref 0.76–1.27)
GFR calc Af Amer: 86 mL/min/{1.73_m2} (ref >59)
GFR, EST NON AFRICAN AMERICAN: 74 mL/min/{1.73_m2} (ref >59)
GLUCOSE: 185 mg/dL — AB (ref 65–99)
POTASSIUM: 5 mmol/L (ref 3.5–5.2)
SODIUM: 135 mmol/L (ref 134–144)

## 2015-10-04 LAB — LIPID PANEL
CHOL/HDL RATIO: 4.8 ratio (ref 0.0–5.0)
Cholesterol, Total: 169 mg/dL (ref 100–199)
HDL: 35 mg/dL — ABNORMAL LOW (ref >39)
LDL CALC: 83 mg/dL (ref 0–99)
Triglycerides: 254 mg/dL — ABNORMAL HIGH (ref 0–149)
VLDL Cholesterol Cal: 51 mg/dL — ABNORMAL HIGH (ref 5–40)

## 2015-10-04 LAB — VITAMIN D 25 HYDROXY (VIT D DEFICIENCY, FRACTURES): VIT D 25 HYDROXY: 39.9 ng/mL (ref 30.0–100.0)

## 2015-10-06 ENCOUNTER — Ambulatory Visit (INDEPENDENT_AMBULATORY_CARE_PROVIDER_SITE_OTHER): Payer: BLUE CROSS/BLUE SHIELD | Admitting: Family Medicine

## 2015-10-06 ENCOUNTER — Encounter: Payer: Self-pay | Admitting: *Deleted

## 2015-10-06 ENCOUNTER — Encounter: Payer: Self-pay | Admitting: Family Medicine

## 2015-10-06 VITALS — BP 108/74 | HR 101 | Temp 97.0°F | Ht 71.0 in | Wt 238.0 lb

## 2015-10-06 DIAGNOSIS — I10 Essential (primary) hypertension: Secondary | ICD-10-CM | POA: Diagnosis not present

## 2015-10-06 DIAGNOSIS — E119 Type 2 diabetes mellitus without complications: Secondary | ICD-10-CM | POA: Diagnosis not present

## 2015-10-06 DIAGNOSIS — Z8249 Family history of ischemic heart disease and other diseases of the circulatory system: Secondary | ICD-10-CM | POA: Diagnosis not present

## 2015-10-06 DIAGNOSIS — E559 Vitamin D deficiency, unspecified: Secondary | ICD-10-CM | POA: Diagnosis not present

## 2015-10-06 DIAGNOSIS — K219 Gastro-esophageal reflux disease without esophagitis: Secondary | ICD-10-CM

## 2015-10-06 DIAGNOSIS — E669 Obesity, unspecified: Secondary | ICD-10-CM | POA: Diagnosis not present

## 2015-10-06 DIAGNOSIS — E785 Hyperlipidemia, unspecified: Secondary | ICD-10-CM

## 2015-10-06 NOTE — Patient Instructions (Addendum)
Continue current medications. Continue good therapeutic lifestyle changes which include good diet and exercise. Fall precautions discussed with patient. If an FOBT was given today- please return it to our front desk. If you are over 60 years old - you may need Prevnar 13 or the adult Pneumonia vaccine.   After your visit with [place] today you will receive a survey in the mail or online from [employer] regarding your care with [place]. Please take a moment to fill this out. Your feedback is very important to [place] as you can help [place] better understand your patient needs as well as improve your experience and satisfaction. WE CARE ABOUT YOU!!!   The patient must do better with diabetes control including aggressive therapeutic lifestyle changes. He must keep regular appointments with the diabetic educator until this begins to happen and he starts feeling better. We will also schedule him for a visit with the cardiologist for further evaluation because of increased risk factors for heart disease.

## 2015-10-06 NOTE — Progress Notes (Signed)
Subjective:    Patient ID: Scott [name], male    DOB: Jul 28, 1955, 60 y.o.   MRN: [id number]  HPI Pt here for follow up and management of chronic medical problems which includes diabetes and hyperlipidemia. He is taking medications regularly.The patient has had his lab work done. This will be reviewed with him during the visit today. His biggest complaint is fatigue and sleepiness in the afternoon. Cholesterol numbers were good except his triglycerides are very elevated at 254. The blood sugar was elevated at 185. The CBC was within normal limits liver function tests were normal and the vitamin D level was good. Hemoglobin A1c was very elevated at 9.1 and I'm sure this is the reason for his fatigue in the afternoon. The patient denies any chest pain or shortness of breath. He denies any trouble with his stomach GI tract nausea vomiting diarrhea or blood in the stool. He is passing his water well. Musculoskeletal-wise he says he feels better than his felt a long time. He acknowledges that he is not sticking with his diet and watching his weight like he should. He is feeling guilty about this. He is agreeing to go see the clinical pharmacists who is a certified diabetic educator to get some help with getting his blood sugar back down and reducing this risk factor [place]. We also discussed having him to see the cardiologist for further evaluation because of risk factors for heart disease. He is a past smoker he has diabetes and hyperlipidemia and he is overweight.     Patient Active Problem List   Diagnosis Date Noted  . Hyperlipidemia 02/12/2013  . Tobacco use disorder 01/05/2013  . Back pain, acute 12/13/2012  . Elevated blood pressure 12/04/2012  . H/O asbestosis 12/04/2012  . Erectile dysfunction 12/04/2012  . Neuropathy of both upper extremities (HCC) 12/04/2012  . Degenerative arthritis of cervical spine 12/04/2012  . BPH (benign prostatic hyperplasia) 12/04/2012  . GERD (gastroesophageal  reflux disease) 06/03/2012  . Diabetes (HCC) 06/03/2012  . Generalized anxiety disorder 06/03/2012   Outpatient Encounter Prescriptions as of 10/06/2015  Medication Sig  . albuterol (PROVENTIL HFA;VENTOLIN HFA) 108 (90 BASE) MCG/ACT inhaler Inhale 2 puffs into the lungs every 6 (six) hours as needed for wheezing.  Marland Kitchen aspirin 81 MG tablet Take 1 tablet (81 mg total) by mouth daily.  . canagliflozin (INVOKANA) 100 MG TABS tablet Take 1 tablet (100 mg total) by mouth daily before breakfast.  . cycloSPORINE (RESTASIS) 0.05 % ophthalmic emulsion 1 drop 2 (two) times daily.  . fenofibrate 160 MG tablet Take 1 tablet (160 mg total) by mouth daily.  Marland Kitchen glucose blood test strip Test blood sugar BID dx 250.02  . lisinopril (PRINIVIL,ZESTRIL) 5 MG tablet Take 1 tablet (5 mg total) by mouth daily.  . metFORMIN (GLUCOPHAGE) 500 MG tablet TAKE 2 TABLETS (1,000 MG TOTAL) BY MOUTH 2 (TWO) TIMES DAILY.  . ONE TOUCH LANCETS MISC Check blood sugar twice daily DX 250.02  . Vitamin D, Ergocalciferol, (DRISDOL) 50000 units CAPS capsule TAKE ONE CAPSULE EVERY SEVEN DAYS   No facility-administered encounter medications on file as of 10/06/2015.       Review of Systems  Constitutional: Positive for fatigue (in the afternoons / sleepy).  HENT: Negative.   Eyes: Negative.   Respiratory: Negative.   Cardiovascular: Negative.   Gastrointestinal: Negative.   Endocrine: Negative.   Genitourinary: Negative.   Musculoskeletal: Negative.   Skin: Negative.   Allergic/Immunologic: Negative.   Neurological: Negative.   Hematological:  Negative.   Psychiatric/Behavioral: Negative.        Objective:   Physical Exam  Constitutional: He is oriented to person, place, and time. He appears well-developed and well-nourished. No distress.  HENT:  Head: Normocephalic and atraumatic.  Right Ear: External ear normal.  Left Ear: External ear normal.  Nose: Nose normal.  Mouth/Throat: Oropharynx is clear and moist. No  oropharyngeal exudate.  Eyes: Conjunctivae and EOM are normal. Pupils are equal, round, and reactive to light. Right eye exhibits no discharge. Left eye exhibits no discharge. No scleral icterus.  Neck: Normal range of motion. Neck supple. No thyromegaly present.  Cardiovascular: Normal rate, regular rhythm, normal heart sounds and intact distal pulses.   No murmur heard. The heart is regular at 96/m  Pulmonary/Chest: Effort normal and breath sounds normal. No respiratory distress. He has no wheezes. He has no rales. He exhibits no tenderness.  Were a few scattered wheezes but sparse in nature. No axillary adenopathy Clear anteriorly and posteriorly  Abdominal: Soft. Bowel sounds are normal. He exhibits no mass. There is no tenderness. There is no rebound and no guarding.  Abdominal obesity without masses tenderness or organ enlargement  Musculoskeletal: Normal range of motion. He exhibits no edema.  Lymphadenopathy:    He has no cervical adenopathy.  Neurological: He is alert and oriented to person, place, and time. He has normal reflexes. No cranial nerve deficit.  Skin: Skin is warm and dry. No rash noted.  Psychiatric: He has a normal mood and affect. His behavior is normal. Judgment and thought content normal.  The patient is anxious about his diabetic condition and is committed to trying to do better.  Nursing note and vitals reviewed.  BP 108/74 (BP Location: Right Arm)   Pulse (!) 101   Temp 97 F (36.1 C) (Oral)   Ht  (1.803 m)   Wt 238 lb (108 kg)   BMI 33.19 kg/m         Assessment & Plan:  1. Type 2 diabetes mellitus without complication, without long-term current use of insulin (HCC) -This is under poor control. The patient has been scheduled with our diabetic educator and he is also committed to doing better with his diet and exercise. Because of diabetes and hyperlipidemia and family history of heart disease he will be scheduled to see the cardiologist also for  further evaluation.  2. Gastroesophageal reflux disease, esophagitis presence not specified -The patient is having no symptoms with this today.  3. Hyperlipidemia -His triglycerides were elevated and in large part this is due to his elevated A1c. - Ambulatory referral to Cardiology  4. Essential hypertension, benign -The blood pressure is good today and he was placed on lisinopril originally to protect his kidneys from the affect of diabetes. - Ambulatory referral to Cardiology  5. Vitamin D deficiency -His vitamin D level is good and he is taking his 50,000 units once a week.  6. Family history of heart disease - Ambulatory referral to Cardiology  7. Obesity -Work with the certified diabetic educator to lose weight and get blood sugar under better control  Patient Instructions  Continue current medications. Continue good therapeutic lifestyle changes which include good diet and exercise. Fall precautions discussed with patient. If an FOBT was given today- please return it to our front desk. If you are over 76 years old - you may need Prevnar 13 or the adult Pneumonia vaccine.   After your visit with [place] today you will receive a  survey in the mail or online from [employer] regarding your care with [place]. Please take a moment to fill this out. Your feedback is very important to [place] as you can help [place] better understand your patient needs as well as improve your experience and satisfaction. WE CARE ABOUT YOU!!!   The patient must do better with diabetes control including aggressive therapeutic lifestyle changes. He must keep regular appointments with the diabetic educator until this begins to happen and he starts feeling better. We will also schedule him for a visit with the cardiologist for further evaluation because of increased risk factors for heart disease.    Nyra Capes MD

## 2015-10-12 ENCOUNTER — Encounter: Payer: Self-pay | Admitting: Pharmacist

## 2015-10-12 ENCOUNTER — Ambulatory Visit (INDEPENDENT_AMBULATORY_CARE_PROVIDER_SITE_OTHER): Payer: BLUE CROSS/BLUE SHIELD | Admitting: Pharmacist

## 2015-10-12 VITALS — Ht 71.0 in | Wt 239.0 lb

## 2015-10-12 DIAGNOSIS — E119 Type 2 diabetes mellitus without complications: Secondary | ICD-10-CM

## 2015-10-12 DIAGNOSIS — E785 Hyperlipidemia, unspecified: Secondary | ICD-10-CM

## 2015-10-12 DIAGNOSIS — E669 Obesity, unspecified: Secondary | ICD-10-CM | POA: Diagnosis not present

## 2015-10-12 MED ORDER — EMPAGLIFLOZIN 25 MG PO TABS: 25.0000 mg | ORAL_TABLET | Freq: Every day | ORAL | 0 refills | Status: DC

## 2015-10-12 NOTE — Progress Notes (Signed)
Subjective:    Scott [name] is a 60 y.o. male who presents for first visit to discuss Type 2 diabetes mellitus.  Current symptoms/problems include hyperglycemia, polydipsia and polyuria and have been unchanged. Symptoms have been present for 4 months. He has had 2 surgeries in the last 12 months - one which had several complications  He is currently taking Metformin 500mg  2 tablets bid and Invokana 100mg  qd Last A1c was 9.1% (10/02/2105)  Known diabetic complications: none Cardiovascular risk factors: advanced age (older than 51 for men, 48 for women), diabetes mellitus, dyslipidemia, male gender, obesity (BMI >= 30 kg/m2), sedentary lifestyle and past smoker (quit 01/2015)  Eye exam current (within one year): yes Weight trend: stable Prior visit with dietician: no Current diet: in general, an "unhealthy" diet.  Reports eating lots of fried food, especially fried corn.  Current exercise: none  Current monitoring regimen: home blood tests - 2 times weekly Home blood sugar records: 150 to 250 Any episodes of hypoglycemia? no  Is He on ACE inhibitor or angiotensin II receptor blocker?  Yes  lisinopril (Zestril)    Objective:    Ht 5\' 11"  (1.803 m)   Wt 239 lb (108.4 kg)   BMI 33.33 kg/m   Lab Review Glucose (mg/dL)  Date Value  28/76/8115 185 (H)  05/09/2015 165 (H)  12/30/2014 169 (H)   Glucose, Bld (mg/dL)  Date Value  72/62/0355 149 (H)  12/13/2012 147 (H)  05/30/2012 191 (H)   CO2 (mmol/L)  Date Value  10/03/2015 23  05/09/2015 20  12/30/2014 23   BUN (mg/dL)  Date Value  97/41/6384 17  05/09/2015 17  12/30/2014 13   Creat (mg/dL)  Date Value  53/64/6803 0.82   Creatinine, Ser (mg/dL)  Date Value  21/22/4825 1.08  05/09/2015 1.03  12/30/2014 0.86    Assessment:    Diabetes Mellitus type II, under inadequate control.   Patient is motivated to make lifestyle changes for better BG control   Plan:    1.  Rx changes: change Inovakana to  Jardiance 25mg  take 1 tablet daily   Also discussed pros and cons of herbal medication he had questions about called diabinazole 2.  Education: Reviewed 'ABCs' of diabetes management (respective goals in parentheses):  A1C (<7), blood pressure (<130/80), and cholesterol (LDL <100). 3. Discussed complications of DM and prevention 4. CHO counting diet discussed.  Reviewed CHO amount in various foods and how to read nutrition labels.  Discussed recommended serving sizes.   Recommended for weight loss that he keep CHO amount to less than 50 grams per meal and less than 15 grams per snack  5.  Recommended increase physical activity - goal is 150 minutes per week 6. Follow up: 4 weeks

## 2015-10-12 NOTE — Patient Instructions (Signed)
Diabetes and Standards of Medical Care   Diabetes is complicated. You may find that your diabetes team includes a dietitian, nurse, diabetes educator, eye doctor, and more. To help everyone know what is going on and to help you get the care you deserve, the following schedule of care was developed to help keep you on track. Below are the tests, exams, vaccines, medicines, education, and plans you will need.  Blood Glucose Goals Prior to meals = 80 - 130 Within 2 hours of the start of a meal = less than 180  HbA1c test (goal is less than 7.0% - your last value was %) This test shows how well you have controlled your glucose over the past 2 to 3 months. It is used to see if your diabetes management plan needs to be adjusted.   It is performed at least 2 times a year if you are meeting treatment goals.  It is performed 4 times a year if therapy has changed or if you are not meeting treatment goals.  Blood pressure test  This test is performed at every routine medical visit. The goal is less than 140/90 mmHg for most people, but 130/80 mmHg in some cases. Ask your health care provider about your goal.  Dental exam  Follow up with the dentist regularly.  Eye exam  If you are diagnosed with type 1 diabetes as a child, get an exam upon reaching the age of 10 years or older and have had diabetes for 3 to 5 years. Yearly eye exams are recommended after that initial eye exam.  If you are diagnosed with type 1 diabetes as an adult, get an exam within 5 years of diagnosis and then yearly.  If you are diagnosed with type 2 diabetes, get an exam as soon as possible after the diagnosis and then yearly.  Foot care exam  Visual foot exams are performed at every routine medical visit. The exams check for cuts, injuries, or other problems with the feet.  A comprehensive foot exam should be done yearly. This includes visual inspection as well as assessing foot pulses and testing for loss of  sensation.  Check your feet nightly for cuts, injuries, or other problems with your feet. Tell your health care provider if anything is not healing.  Kidney function test (urine microalbumin)  This test is performed once a year.  Type 1 diabetes: The first test is performed 5 years after diagnosis.  Type 2 diabetes: The first test is performed at the time of diagnosis.  A serum creatinine and estimated glomerular filtration rate (eGFR) test is done once a year to assess the level of chronic kidney disease (CKD), if present.  Lipid profile (cholesterol, HDL, LDL, triglycerides)  Performed every 5 years for most people.  The goal for LDL is less than 100 mg/dL. If you are at high risk, the goal is less than 70 mg/dL.  The goal for HDL is 40 mg/dL to 50 mg/dL for men and 50 mg/dL to 60 mg/dL for women. An HDL cholesterol of 60 mg/dL or higher gives some protection against heart disease.  The goal for triglycerides is less than 150 mg/dL.  Influenza vaccine, pneumococcal vaccine, and hepatitis B vaccine  The influenza vaccine is recommended yearly.  The pneumococcal vaccine is generally given once in a lifetime. However, there are some instances when another vaccination is recommended. Check with your health care provider.  The hepatitis B vaccine is also recommended for adults with diabetes.    Diabetes self-management education  Education is recommended at diagnosis and ongoing as needed.  Treatment plan  Your treatment plan is reviewed at every medical visit.  Document Released: 12/24/2008 Document Revised: 10/29/2012 Document Reviewed: 07/29/2012 [facility] Patient Information 2014 [place].

## 2015-11-08 ENCOUNTER — Telehealth: Payer: Self-pay | Admitting: Family Medicine

## 2015-11-08 NOTE — Telephone Encounter (Signed)
Patient need #7 more tablets of jardiance to make I until next appt  Gave #7 tablets

## 2015-11-15 ENCOUNTER — Encounter: Payer: Self-pay | Admitting: Pharmacist

## 2015-11-15 ENCOUNTER — Ambulatory Visit (INDEPENDENT_AMBULATORY_CARE_PROVIDER_SITE_OTHER): Payer: BLUE CROSS/BLUE SHIELD | Admitting: Pharmacist

## 2015-11-15 VITALS — BP 112/72 | HR 75 | Ht 71.0 in | Wt 241.0 lb

## 2015-11-15 DIAGNOSIS — E1165 Type 2 diabetes mellitus with hyperglycemia: Secondary | ICD-10-CM

## 2015-11-15 LAB — BASIC METABOLIC PANEL
BUN / CREAT RATIO: 20 (ref 10–24)
BUN: 21 mg/dL (ref 8–27)
CO2: 20 mmol/L (ref 18–29)
CREATININE: 1.05 mg/dL (ref 0.76–1.27)
Calcium: 9.5 mg/dL (ref 8.6–10.2)
Chloride: 101 mmol/L (ref 96–106)
GFR, EST AFRICAN AMERICAN: 89 mL/min/{1.73_m2} (ref >59)
GFR, EST NON AFRICAN AMERICAN: 77 mL/min/{1.73_m2} (ref >59)
Glucose: 134 mg/dL — ABNORMAL HIGH (ref 65–99)
Potassium: 4.8 mmol/L (ref 3.5–5.2)
Sodium: 137 mmol/L (ref 134–144)

## 2015-11-15 MED ORDER — EMPAGLIFLOZIN 25 MG PO TABS: 25.0000 mg | ORAL_TABLET | Freq: Every day | ORAL | 1 refills | Status: DC

## 2015-11-15 NOTE — Progress Notes (Signed)
Subjective:    Scott [name] is a 60 y.o. male who presents for follow up to discuss Type 2 diabetes mellitus.  Current symptoms/problems include hyperglycemia, polydipsia and polyuria and have been decreasing over the last month.   He is currently taking Metformin 500mg  2 tablets bid and Jardiance 25mg  qd (started at last visit about 1 month ago) Last A1c was 9.1% (10/02/2105)  Known diabetic complications: none Cardiovascular risk factors: advanced age (older than 155 for men, 3565 for women), diabetes mellitus, dyslipidemia, male gender, obesity (BMI >= 30 kg/m2), sedentary lifestyle and past smoker (quit 01/2015)  Eye exam current (within one year): yes Weight trend: increased by 3# Prior visit with dietician: no Current diet: following low CHO diet - limiting serving sizes more since last visit..  Current exercise: walking and usually 15 minutes once or twice a day 6 to 7 days per week  Current monitoring regimen: home blood tests - 2 times weekly Home blood sugar records: 150 to 250 Any episodes of hypoglycemia? no  Is He on ACE inhibitor or angiotensin II receptor blocker?  Yes  lisinopril (Zestril)    Objective:    BP 112/72   Pulse 75   Ht 5\' 11"  (1.803 m)   Wt 241 lb (109.3 kg)   BMI 33.61 kg/m   Lab Review Glucose (mg/dL)  Date Value  14/78/295607/24/2017 185 (H)  05/09/2015 165 (H)  12/30/2014 169 (H)   Glucose, Bld (mg/dL)  Date Value  21/30/865710/06/2012 149 (H)  12/13/2012 147 (H)  05/30/2012 191 (H)   CO2 (mmol/L)  Date Value  10/03/2015 23  05/09/2015 20  12/30/2014 23   BUN (mg/dL)  Date Value  84/69/629507/24/2017 17  05/09/2015 17  12/30/2014 13   Creat (mg/dL)  Date Value  28/41/324403/21/2014 0.82   Creatinine, Ser (mg/dL)  Date Value  01/02/725307/24/2017 1.08  05/09/2015 1.03  12/30/2014 0.86    Assessment:    Diabetes Mellitus type II, under improving control.   Patient is motivated to make lifestyle changes for better BG control   Plan:    1.  Rx changes: Continue  Jardiance 25mg  take 1 tablet daily  - discount card given 2.  Education: Reviewed 'ABCs' of diabetes management (respective goals in parentheses):  A1C (<7), blood pressure (<130/80), and cholesterol (LDL <100). 3. Continue with CHO counting diet  4.  Recommended continue physical activity - increase to 20 minutes per activity as able - goal is 150 minutes per week 5.   Orders Placed This Encounter  Procedures  . Basic Metabolic Panel    6. Follow up: 3 months

## 2015-11-22 ENCOUNTER — Telehealth: Payer: Self-pay | Admitting: Family Medicine

## 2015-11-22 NOTE — Progress Notes (Signed)
Cardiology Office Note   Date:  11/23/2015   ID:  Scott [name], DOB [birth year], MRN [id number]  PCP:  Rudi Heap, MD  Cardiologist:   Rollene Rotunda, MD   Chief Complaint  Patient presents with  . Chest Pain      History of Present Illness: Scott [name] is a 60 y.o. male who presents for evaluation of chest pain. He does have chest discomfort that he gets occasionally. He wondered if it might be a chest wall soreness. Seems to be somewhat sporadic. He does get dyspneic with exertion but is trying to walk a little bit more and is up to a mile. He does not have any resting shortness of breath, PND or orthopnea. He does not have palpitations, presyncope or syncope. He's been trying to lose some weight. He has no edema.  The patient has no past cardiac history. I saw him in 2014 for evaluation of multiple cardiovascular risk factors.  He was to have a POET (Plain Old Exercise Treadmill) but this was not done.  He said he couldn't afford to have done at that time.  Past Medical History:  Diagnosis Date  . Anxiety   . Asbestosis (HCC)   . Diabetes mellitus without complication (HCC)   . Dyslipidemia   . GERD (gastroesophageal reflux disease)   . Sleep apnea   . Tobacco abuse     Past Surgical History:  Procedure Laterality Date  . FOOT MASS EXCISION    . VASECTOMY       Current Outpatient Prescriptions  Medication Sig Dispense Refill  . albuterol (PROVENTIL HFA;VENTOLIN HFA) 108 (90 BASE) MCG/ACT inhaler Inhale 2 puffs into the lungs every 6 (six) hours as needed for wheezing. 1 Inhaler 3  . aspirin 81 MG tablet Take 1 tablet (81 mg total) by mouth daily. 30 tablet   . cycloSPORINE (RESTASIS) 0.05 % ophthalmic emulsion 1 drop 2 (two) times daily.    . empagliflozin (JARDIANCE) 25 MG TABS tablet Take 25 mg by mouth daily. 30 tablet 1  . fenofibrate 160 MG tablet Take 1 tablet (160 mg total) by mouth daily. 90 tablet 3  . glucose blood test strip Test blood sugar  BID dx 250.02 100 each 12  . lisinopril (PRINIVIL,ZESTRIL) 5 MG tablet Take 1 tablet (5 mg total) by mouth daily. 30 tablet 11  . metFORMIN (GLUCOPHAGE) 500 MG tablet TAKE 2 TABLETS (1,000 MG TOTAL) BY MOUTH 2 (TWO) TIMES DAILY. 360 tablet 3  . Vitamin D, Ergocalciferol, (DRISDOL) 50000 units CAPS capsule TAKE ONE CAPSULE EVERY SEVEN DAYS 12 capsule 1  . ONE TOUCH LANCETS MISC Check blood sugar twice daily DX 250.02 100 each 12   No current facility-administered medications for this visit.     Allergies:   Augmentin [amoxicillin-pot clavulanate]; Codeine; Oxycontin [oxycodone hcl]; and Atorvastatin    Social History:  The patient  reports that he quit smoking about 2 years ago. His smoking use included Cigarettes. He smoked 0.00 packs per day. He has never used smokeless tobacco. He reports that he drinks alcohol. He reports that he does not use drugs.   Family History:  The patient's family history includes CAD in his father; CAD (age of onset: 40) in his brother; Stroke in his father.    ROS:  Please see the history of present illness.   Otherwise, review of systems are positive for none.   All other systems are reviewed and negative.    PHYSICAL EXAM: VS:  BP 120/60  Pulse 86   Ht 5\' 11"  (1.803 m)   Wt 239 lb (108.4 kg)   BMI 33.33 kg/m  , BMI Body mass index is 33.33 kg/m. GENERAL:  Well appearing HEENT:  Pupils equal round and reactive, fundi not visualized, oral mucosa unremarkable NECK:  No jugular venous distention, waveform within normal limits, carotid upstroke brisk and symmetric, no bruits, no thyromegaly LYMPHATICS:  No cervical, inguinal adenopathy LUNGS:  Clear to auscultation bilaterally BACK:  No CVA tenderness CHEST:  Unremarkable HEART:  PMI not displaced or sustained,S1 and S2 within normal limits, no S3, no S4, no clicks, no rubs, no murmurs ABD:  Flat, positive bowel sounds normal in frequency in pitch, no bruits, no rebound, no guarding, no midline  pulsatile mass, no hepatomegaly, no splenomegaly EXT:  2 plus pulses throughout, no edema, no cyanosis no clubbing SKIN:  No rashes no nodules NEURO:  Cranial nerves II through XII grossly intact, motor grossly intact throughout PSYCH:  Cognitively intact, oriented to person place and time    EKG:  EKG is ordered today. The ekg ordered today demonstrates sinus rhythm, rate 86, axis within normal limits, intervals within the limits, no acute ST   Recent Labs: 10/03/2015: ALT 31; Platelets 253 11/15/2015: BUN 21; Creatinine, Ser 1.05; Potassium 4.8; Sodium 137    Lipid Panel    Component Value Date/Time   CHOL 169 10/03/2015 1005   TRIG 254 (H) 10/03/2015 1005   TRIG 446 (H) 05/09/2015 0941   HDL 35 (L) 10/03/2015 1005   HDL 34 (L) 05/09/2015 0941   CHOLHDL 4.8 10/03/2015 1005   LDLCALC 83 10/03/2015 1005   LDLCALC 94 12/16/2013 1013      Wt Readings from Last 3 Encounters:  11/23/15 239 lb (108.4 kg)  11/15/15 241 lb (109.3 kg)  10/12/15 239 lb (108.4 kg)      Other studies Reviewed: Additional studies/ records that were reviewed today include: none. Review of the above records demonstrates:  Please see elsewhere in the note.     ASSESSMENT AND PLAN   OVERWEIGHT:  He understands the need for weight loss with diet and exercise.  We talked about specifics.  MULTIPLE CARDIOVASCULAR RISK FACTORS:   The patient does have chest discomfort and some dyspnea and multiple risk factors.  I will bring the patient back for POET (Plain Old Exercise Test). This will allow me to screen for obstructive coronary disease, risk stratify and very importantly provide a prescription for exercise.   Current medicines are reviewed at length with the patient today.  The patient does not have concerns regarding medicines.  The following changes have been made:  no change  Labs/ tests ordered today include:   Orders Placed This Encounter  Procedures  . Exercise Tolerance Test  . EKG  12-Lead     Disposition:   FU with me as needed.      Signed, Rollene RotundaJames Flonnie Wierman, MD  11/23/2015 6:27 PM    [facility]

## 2015-11-23 ENCOUNTER — Encounter: Payer: Self-pay | Admitting: Cardiology

## 2015-11-23 ENCOUNTER — Ambulatory Visit (INDEPENDENT_AMBULATORY_CARE_PROVIDER_SITE_OTHER): Payer: BLUE CROSS/BLUE SHIELD | Admitting: Cardiology

## 2015-11-23 VITALS — BP 120/60 | HR 86 | Ht 71.0 in | Wt 239.0 lb

## 2015-11-23 DIAGNOSIS — R072 Precordial pain: Secondary | ICD-10-CM | POA: Diagnosis not present

## 2015-11-23 DIAGNOSIS — E785 Hyperlipidemia, unspecified: Secondary | ICD-10-CM

## 2015-11-23 DIAGNOSIS — I10 Essential (primary) hypertension: Secondary | ICD-10-CM | POA: Diagnosis not present

## 2015-11-23 NOTE — Patient Instructions (Signed)
Medication Instructions:  The current medical regimen is effective;  continue present plan and medications.  Testing/Procedures: Your physician has requested that you have an exercise tolerance test. For further information please visit [email or web address]. Please also follow instruction sheet, as given.  Follow-Up: Follow up as needed after your testing.  If you need a refill on your cardiac medications before your next appointment, please call your pharmacy.  Thank you for choosing [facility]!!

## 2015-11-24 NOTE — Telephone Encounter (Signed)
Tried to call again - LM on VM 

## 2015-11-24 NOTE — Telephone Encounter (Signed)
Tried to call patient - Lm on VM

## 2015-11-29 ENCOUNTER — Observation Stay (HOSPITAL_COMMUNITY)
Admission: EM | Admit: 2015-11-29 | Discharge: 2015-11-30 | Disposition: A | Discharge status: Home / Self Care | Payer: BLUE CROSS/BLUE SHIELD | Attending: Internal Medicine | Admitting: Internal Medicine

## 2015-11-29 ENCOUNTER — Encounter (HOSPITAL_COMMUNITY): Payer: Self-pay

## 2015-11-29 ENCOUNTER — Emergency Department (HOSPITAL_COMMUNITY): Payer: BLUE CROSS/BLUE SHIELD

## 2015-11-29 DIAGNOSIS — E119 Type 2 diabetes mellitus without complications: Secondary | ICD-10-CM | POA: Diagnosis not present

## 2015-11-29 DIAGNOSIS — Z8249 Family history of ischemic heart disease and other diseases of the circulatory system: Secondary | ICD-10-CM | POA: Insufficient documentation

## 2015-11-29 DIAGNOSIS — R079 Chest pain, unspecified: Secondary | ICD-10-CM | POA: Diagnosis not present

## 2015-11-29 DIAGNOSIS — G4733 Obstructive sleep apnea (adult) (pediatric): Secondary | ICD-10-CM | POA: Insufficient documentation

## 2015-11-29 DIAGNOSIS — Z880 Allergy status to penicillin: Secondary | ICD-10-CM | POA: Insufficient documentation

## 2015-11-29 DIAGNOSIS — Z7984 Long term (current) use of oral hypoglycemic drugs: Secondary | ICD-10-CM | POA: Diagnosis not present

## 2015-11-29 DIAGNOSIS — K219 Gastro-esophageal reflux disease without esophagitis: Secondary | ICD-10-CM | POA: Diagnosis not present

## 2015-11-29 DIAGNOSIS — Z7982 Long term (current) use of aspirin: Secondary | ICD-10-CM | POA: Diagnosis not present

## 2015-11-29 DIAGNOSIS — E785 Hyperlipidemia, unspecified: Secondary | ICD-10-CM | POA: Diagnosis not present

## 2015-11-29 DIAGNOSIS — Z7985 Long-term (current) use of injectable non-insulin antidiabetic drugs: Secondary | ICD-10-CM

## 2015-11-29 DIAGNOSIS — Z87891 Personal history of nicotine dependence: Secondary | ICD-10-CM | POA: Diagnosis not present

## 2015-11-29 DIAGNOSIS — R0789 Other chest pain: Principal | ICD-10-CM | POA: Insufficient documentation

## 2015-11-29 DIAGNOSIS — J449 Chronic obstructive pulmonary disease, unspecified: Secondary | ICD-10-CM | POA: Insufficient documentation

## 2015-11-29 DIAGNOSIS — Z77090 Contact with and (suspected) exposure to asbestos: Secondary | ICD-10-CM | POA: Insufficient documentation

## 2015-11-29 LAB — I-STAT TROPONIN, ED
Troponin i, poc: 0.01 ng/mL (ref 0.00–0.08)
Troponin i, poc: 0 ng/mL (ref 0.00–0.08)

## 2015-11-29 LAB — BASIC METABOLIC PANEL
ANION GAP: 8 (ref 5–15)
BUN: 21 mg/dL — ABNORMAL HIGH (ref 6–20)
CHLORIDE: 108 mmol/L (ref 101–111)
CO2: 21 mmol/L — AB (ref 22–32)
Calcium: 8.9 mg/dL (ref 8.9–10.3)
Creatinine, Ser: 1.08 mg/dL (ref 0.61–1.24)
GFR calc Af Amer: >60 mL/min (ref >60)
GFR calc non Af Amer: >60 mL/min (ref >60)
GLUCOSE: 109 mg/dL — AB (ref 65–99)
POTASSIUM: 3.9 mmol/L (ref 3.5–5.1)
Sodium: 137 mmol/L (ref 135–145)

## 2015-11-29 LAB — CBC
HCT: 42.5 % (ref 39.0–52.0)
HEMOGLOBIN: 14.2 g/dL (ref 13.0–17.0)
MCH: 30.4 pg (ref 26.0–34.0)
MCHC: 33.4 g/dL (ref 30.0–36.0)
MCV: 91 fL (ref 78.0–100.0)
Platelets: 226 10*3/uL (ref 150–400)
RBC: 4.67 MIL/uL (ref 4.22–5.81)
RDW: 12.9 % (ref 11.5–15.5)
WBC: 7.5 10*3/uL (ref 4.0–10.5)

## 2015-11-29 MED ORDER — ACETAMINOPHEN 325 MG PO TABS: 650.0000 mg | ORAL_TABLET | ORAL | Status: DC | PRN

## 2015-11-29 MED ORDER — LISINOPRIL 5 MG PO TABS
5.0000 mg | ORAL_TABLET | Freq: Every day | ORAL | Status: DC
  Filled 2015-11-29: qty 1

## 2015-11-29 MED ORDER — CYCLOSPORINE 0.05 % OP EMUL
1.0000 [drp] | Freq: Two times a day (BID) | OPHTHALMIC | Status: DC
  Administered 2015-11-30: 1 [drp] via OPHTHALMIC
  Filled 2015-11-29 (×4): qty 1

## 2015-11-29 MED ORDER — ALBUTEROL SULFATE (2.5 MG/3ML) 0.083% IN NEBU: 2.5000 mg | INHALATION_SOLUTION | Freq: Four times a day (QID) | RESPIRATORY_TRACT | Status: DC | PRN

## 2015-11-29 MED ORDER — ENOXAPARIN SODIUM 40 MG/0.4ML ~~LOC~~ SOLN
40.0000 mg | Freq: Every day | SUBCUTANEOUS | Status: DC
  Administered 2015-11-30: 40 mg via SUBCUTANEOUS
  Filled 2015-11-29: qty 0.4

## 2015-11-29 MED ORDER — CANAGLIFLOZIN 100 MG PO TABS: 100.0000 mg | ORAL_TABLET | Freq: Every day | ORAL | Status: DC

## 2015-11-29 MED ORDER — ASPIRIN EC 325 MG PO TBEC
325.0000 mg | DELAYED_RELEASE_TABLET | Freq: Every day | ORAL | Status: DC
  Administered 2015-11-30: 325 mg via ORAL
  Filled 2015-11-29: qty 1

## 2015-11-29 MED ORDER — FENOFIBRATE 160 MG PO TABS
160.0000 mg | ORAL_TABLET | Freq: Every day | ORAL | Status: DC
  Administered 2015-11-30: 160 mg via ORAL
  Filled 2015-11-29: qty 1

## 2015-11-29 MED ORDER — NITROGLYCERIN 0.4 MG SL SUBL: 0.4000 mg | SUBLINGUAL_TABLET | SUBLINGUAL | Status: DC | PRN

## 2015-11-29 MED ORDER — LINAGLIPTIN 5 MG PO TABS
5.0000 mg | ORAL_TABLET | Freq: Every day | ORAL | Status: DC
  Administered 2015-11-30: 5 mg via ORAL
  Filled 2015-11-29: qty 1

## 2015-11-29 MED ORDER — SODIUM CHLORIDE 0.9 % IV SOLN
INTRAVENOUS | Status: DC
  Administered 2015-11-30 (×2): via INTRAVENOUS

## 2015-11-29 MED ORDER — ONDANSETRON HCL 4 MG/2ML IJ SOLN: 4.0000 mg | Freq: Four times a day (QID) | INTRAMUSCULAR | Status: DC | PRN

## 2015-11-29 NOTE — ED Notes (Signed)
Per EMS, pt from home. Pt came to EMS station with complaint of left sided chest pressure with radiation to left arm and nausea. Pt NSR on 12 lead, pt given 324 asa and 1 nitro with relief. Pt has hx of DM. Pt alert and oriented x4 and breath sounds clear. VSS.

## 2015-11-29 NOTE — ED Provider Notes (Signed)
[facility]-EMERGENCY DEPT Provider Note   CSN: [id number] Arrival date & time: 11/29/15  1910     History   Chief Complaint Chief Complaint  Patient presents with  . Chest Pain    HPI Scott [name] is a 60 y.o. male.  HPI 60 year old male who presents with chest pain. He has a history of obesity, dyslipidemia, diabetes, and hypertension. States onset of chest pressure and left arm heaviness today around 2 PM. States that just prior to this he was trying to exercise by walking a mile. He was in the heat and was a little bit sweaty he states that when he got back to his car he felt very unwell and subsequently had the chest pressure. He went home and laid in bed, and symptoms were not improving so he called EMS. Was given nitroglycerin and aspirin with some improvement in his pain. He had associated nausea, but denies abdominal pain, vomiting, cough, fevers or chills, difficulty breathing, syncope or or near syncope. Pain is not radiating and is not associated with severe back pain. Minimal chest pressure on presentation. Pain not worsened with movement or palpation. States he did do a little heavy lifting this morning of a dryer, but did not feel he strained a muscle.   Past Medical History:  Diagnosis Date  . Anxiety   . Asbestosis (HCC)   . Diabetes mellitus without complication (HCC)   . Dyslipidemia   . GERD (gastroesophageal reflux disease)   . Sleep apnea   . Tobacco abuse     Patient Active Problem List   Diagnosis Date Noted  . Vitamin D deficiency 10/06/2015  . Hyperlipidemia 02/12/2013  . Tobacco use disorder 01/05/2013  . Back pain, acute 12/13/2012  . Elevated blood pressure 12/04/2012  . H/O asbestosis 12/04/2012  . Erectile dysfunction 12/04/2012  . Neuropathy of both upper extremities (HCC) 12/04/2012  . Degenerative arthritis of cervical spine 12/04/2012  . BPH (benign prostatic hyperplasia) 12/04/2012  . GERD (gastroesophageal reflux disease) 06/03/2012  .  Diabetes (HCC) 06/03/2012  . Generalized anxiety disorder 06/03/2012    Past Surgical History:  Procedure Laterality Date  . FOOT MASS EXCISION    . VASECTOMY         Home Medications    Prior to Admission medications   Medication Sig Start Date End Date Taking? Authorizing Provider  albuterol (PROVENTIL HFA;VENTOLIN HFA) 108 (90 BASE) MCG/ACT inhaler Inhale 2 puffs into the lungs every 6 (six) hours as needed for wheezing. 10/03/12  Yes Coralie Keens, FNP  alum & mag hydroxide-simeth (MAALOX/MYLANTA) 200-200-20 MG/5ML suspension Take 30 mLs by mouth every 6 (six) hours as needed for indigestion or heartburn.   Yes Historical Provider, MD  aspirin 81 MG tablet Take 1 tablet (81 mg total) by mouth daily. 01/05/13  Yes Tammy Eckard, PharmD  cycloSPORINE (RESTASIS) 0.05 % ophthalmic emulsion Place 1 drop into both eyes 2 (two) times daily.    Yes Historical Provider, MD  empagliflozin (JARDIANCE) 25 MG TABS tablet Take 25 mg by mouth daily. 11/15/15  Yes Henrene Pastor, PharmD  fenofibrate 160 MG tablet Take 1 tablet (160 mg total) by mouth daily. 05/10/15  Yes Ernestina Penna, MD  lisinopril (PRINIVIL,ZESTRIL) 5 MG tablet Take 1 tablet (5 mg total) by mouth daily. 05/09/15  Yes Ernestina Penna, MD  metFORMIN (GLUCOPHAGE) 500 MG tablet TAKE 2 TABLETS (1,000 MG TOTAL) BY MOUTH 2 (TWO) TIMES DAILY. 08/26/15  Yes Ernestina Penna, MD  Vitamin D, Ergocalciferol, (DRISDOL) 50000  units CAPS capsule TAKE ONE CAPSULE EVERY SEVEN DAYS Patient taking differently: Take 50,000 Units by mouth every Tuesday. TAKE ONE CAPSULE EVERY SEVEN DAYS 05/09/15  Yes Ernestina Penna, MD    Family History Family History  Problem Relation Age of Onset  . Stroke Father     Died age 66  . CAD Father   . CAD Brother 10    CABG    Social History Social History  Substance Use Topics  . Smoking status: Former Smoker    Packs/day: 0.00    Types: Cigarettes    Quit date: 01/16/2013  . Smokeless tobacco: Never Used      Comment: He has patches.  . Alcohol use Yes     Comment: social     Allergies   Augmentin [amoxicillin-pot clavulanate]; Codeine; Penicillins; Atorvastatin; and Oxycontin [oxycodone hcl]   Review of Systems Review of Systems 10/14 systems reviewed and are negative other than those stated in the HPI   Physical Exam Updated Vital Signs BP 116/78   Pulse 73   Temp 98.1 F (36.7 C)   Resp 16   Ht 5\' 11"  (1.803 m)   Wt 239 lb (108.4 kg)   SpO2 96%   BMI 33.33 kg/m   Physical Exam  Physical Exam  Nursing note and vitals reviewed. Constitutional: Well developed, well nourished, non-toxic, and in no acute distress Head: Normocephalic and atraumatic.  Mouth/Throat: Oropharynx is clear and moist.  Neck: Normal range of motion. Neck supple.  Cardiovascular: Normal rate and regular rhythm.   Pulmonary/Chest: Effort normal and breath sounds normal. no chest wall tenderness Abdominal: Soft. There is no tenderness. There is no rebound and no guarding.  Musculoskeletal: Normal range of motion. no edema. No calf tenderness Neurological: Alert, no facial droop, fluent speech, moves all extremities symmetrically Skin: Skin is warm and dry.  Psychiatric: Cooperative  ED Treatments / Results  Labs (all labs ordered are listed, but only abnormal results are displayed) Labs Reviewed  BASIC METABOLIC PANEL - Abnormal; Notable for the following:       Result Value   CO2 21 (*)    Glucose, Bld 109 (*)    BUN 21 (*)    All other components within normal limits  CBC  I-STAT TROPOININ, ED  I-STAT TROPOININ, ED    EKG  EKG Interpretation  Date/Time:  Tuesday November 29 2015 19:10:21 EDT Ventricular Rate:  72 PR Interval:    QRS Duration: 86 QT Interval:  360 QTC Calculation: 394 R Axis:   83 Text Interpretation:  Sinus rhythm Borderline right axis deviation Similar to prior EKG  Confirmed by Devere Brem MD, Myonna Chisom [phone]) on 11/29/2015 7:28:38 PM       Radiology Dg Chest 2  View  Result Date: 11/29/2015 CLINICAL DATA:  LEFT chest tightness and pressure radiating to arm for 1 day. History of diabetes, asbestosis, former smoker. EXAM: CHEST  2 VIEW COMPARISON:  Chest radiograph July 06, 2014 FINDINGS: Cardiomediastinal silhouette is normal. No pleural effusions or focal consolidations. Mild bronchitic changes increased lung volumes with flattened hemidiaphragms. Azygos fissure. Trachea projects midline and there is no pneumothorax. Soft tissue planes and included osseous structures are non-suspicious. Mild degenerative change of the thoracic spine. IMPRESSION: Mild COPD, no superimposed acute cardiopulmonary process. Electronically Signed   By: Awilda Metro M.D.   On: 11/29/2015 20:32    Procedures Procedures (including critical care time)  Medications Ordered in ED Medications  linagliptin (TRADJENTA) tablet 5 mg (not administered)  Initial Impression / Assessment and Plan / ED Course  I have reviewed the triage vital signs and the nursing notes.  Pertinent labs & imaging results that were available during my care of the patient were reviewed by me and considered in my medical decision making (see chart for details).  Clinical Course    60 year old male who presents with chest pressure and left-sided arm heaviness after walking a mile today. This chest pain-free on my evaluation. Hemodynamically stable. With nonischemic EKG and negative first troponin. Chest x-ray without acute cardiopulmonary processes. Concern for ACS given that he has multiple risk factors with overall heart score of 4 concerning history of exertional chest pain. History not concerning for PE, dissection, GI etiology of pain, or other acute intrathoracic processes. Did briefly speak with cardiology who agrees with admission for stress test tomorrow. Discussed with Dr. Philmore PaliKolker K and he will admit to telemetry observation for the remainder of his chest pain rule out.  Final Clinical  Impressions(s) / ED Diagnoses   Final diagnoses:  Nonspecific chest pain    New Prescriptions New Prescriptions   No medications on file     Lavera Guiseana Duo Dewan Emond, MD 11/29/15 2253

## 2015-11-29 NOTE — H&P (Signed)
History and Physical    [name] ZOX:[id number] DOB: 05/18/1955 DOA: 11/29/2015  PCP: Rudi Heap, MD  Patient coming from: Home.  Chief Complaint: Chest pain.  HPI: Scott [name] is a 60 y.o. male with diabetes mellitus type 2, COPD, sleep apnea and history of tobacco abuse presents to the ER because of chest pain. Patient has been having chest pain off and on for last 2 weeks. Patient had followed up with cardiologist last week and is scheduled to have stress test done as outpatient within the next week. This afternoon after having lunch patient started developing chest pressure while walking to his truck. Patient went home and tried to sleep it off. But when woke up patient still had chest pressure. EMS was called and patient was brought to the ER. After the patient was given sublingual nitroglycerin chest pain improved and eventually resolved. Cardiac markers were negative and chest x-ray was showing nothing acute. EKG was showing nonspecific finding. Patient is being admitted for further management of chest pain. During the chest pain episode patient had mild nausea but denies any vomiting or abdominal pain. Abdomen appears benign on exam. It is to be noted that patient has a history of gallstones.  ED Course: See history of presenting illness. EKG chest x-ray and cardiac markers were showing nothing acute.  Review of Systems: As per HPI, rest all negative.   Past Medical History:  Diagnosis Date  . Anxiety   . Asbestosis (HCC)   . Diabetes mellitus without complication (HCC)   . Dyslipidemia   . GERD (gastroesophageal reflux disease)   . Sleep apnea   . Tobacco abuse     Past Surgical History:  Procedure Laterality Date  . FOOT MASS EXCISION    . VASECTOMY       reports that he quit smoking about 2 years ago. His smoking use included Cigarettes. He smoked 0.00 packs per day. He has never used smokeless tobacco. He reports that he drinks alcohol. He reports that  he does not use drugs.  Allergies  Allergen Reactions  . Augmentin [Amoxicillin-Pot Clavulanate] Itching and Nausea And Vomiting  . Codeine Itching and Nausea And Vomiting  . Penicillins Hives and Itching  . Atorvastatin     Myalgias and fatigue  . Oxycontin [Oxycodone Hcl] Itching    Family History  Problem Relation Age of Onset  . Stroke Father     Died age 9  . CAD Father   . CAD Brother 78    CABG    Prior to Admission medications   Medication Sig Start Date End Date Taking? Authorizing Provider  albuterol (PROVENTIL HFA;VENTOLIN HFA) 108 (90 BASE) MCG/ACT inhaler Inhale 2 puffs into the lungs every 6 (six) hours as needed for wheezing. 10/03/12  Yes Coralie Keens, FNP  alum & mag hydroxide-simeth (MAALOX/MYLANTA) 200-200-20 MG/5ML suspension Take 30 mLs by mouth every 6 (six) hours as needed for indigestion or heartburn.   Yes Historical Provider, MD  aspirin 81 MG tablet Take 1 tablet (81 mg total) by mouth daily. 01/05/13  Yes Tammy Eckard, PharmD  cycloSPORINE (RESTASIS) 0.05 % ophthalmic emulsion Place 1 drop into both eyes 2 (two) times daily.    Yes Historical Provider, MD  empagliflozin (JARDIANCE) 25 MG TABS tablet Take 25 mg by mouth daily. 11/15/15  Yes Henrene Pastor, PharmD  fenofibrate 160 MG tablet Take 1 tablet (160 mg total) by mouth daily. 05/10/15  Yes Ernestina Penna, MD  lisinopril (PRINIVIL,ZESTRIL) 5 MG tablet  Take 1 tablet (5 mg total) by mouth daily. 05/09/15  Yes Ernestina Pennaonald W Moore, MD  metFORMIN (GLUCOPHAGE) 500 MG tablet TAKE 2 TABLETS (1,000 MG TOTAL) BY MOUTH 2 (TWO) TIMES DAILY. 08/26/15  Yes Ernestina Pennaonald W Moore, MD  Vitamin D, Ergocalciferol, (DRISDOL) 50000 units CAPS capsule TAKE ONE CAPSULE EVERY SEVEN DAYS Patient taking differently: Take 50,000 Units by mouth every Tuesday. TAKE ONE CAPSULE EVERY SEVEN DAYS 05/09/15  Yes Ernestina Pennaonald W Moore, MD    Physical Exam: Vitals:   11/29/15 2115 11/29/15 2130 11/29/15 2145 11/29/15 2200  BP: 121/76 116/73 135/81  116/78  Pulse: 73 71 76 73  Resp: (!) 6 (!) 9 16 16   Temp:      SpO2: 97% 98% 97% 96%  Weight:      Height:          Constitutional: Not in distress. Vitals:   11/29/15 2115 11/29/15 2130 11/29/15 2145 11/29/15 2200  BP: 121/76 116/73 135/81 116/78  Pulse: 73 71 76 73  Resp: (!) 6 (!) 9 16 16   Temp:      SpO2: 97% 98% 97% 96%  Weight:      Height:       Eyes: Anicteric no pallor. ENMT: No discharge from the ears eyes nose and mouth. Neck: No mass felt. Respiratory: No rhonchi or crepitations. Cardiovascular: S1 and S2 heard. Abdomen: Soft nontender bowel sounds present. No guarding or rigidity. Musculoskeletal: No edema. Skin: No rash. Neurologic: Alert awake oriented to time place and person. Moves all extremities 5 x 5. Psychiatric: Appears normal.   Labs on Admission: I have personally reviewed following labs and imaging studies  CBC:  Recent Labs Lab 11/29/15 1916  WBC 7.5  HGB 14.2  HCT 42.5  MCV 91.0  PLT 226   Basic Metabolic Panel:  Recent Labs Lab 11/29/15 1916  NA 137  K 3.9  CL 108  CO2 21*  GLUCOSE 109*  BUN 21*  CREATININE 1.08  CALCIUM 8.9   GFR: Estimated Creatinine Clearance: 91 mL/min (by C-G formula based on SCr of 1.08 mg/dL). Liver Function Tests: No results for input(s): AST, ALT, ALKPHOS, BILITOT, PROT, ALBUMIN in the last 168 hours. No results for input(s): LIPASE, AMYLASE in the last 168 hours. No results for input(s): AMMONIA in the last 168 hours. Coagulation Profile: No results for input(s): INR, PROTIME in the last 168 hours. Cardiac Enzymes: No results for input(s): CKTOTAL, CKMB, CKMBINDEX, TROPONINI in the last 168 hours. BNP (last 3 results) No results for input(s): PROBNP in the last 8760 hours. HbA1C: No results for input(s): HGBA1C in the last 72 hours. CBG: No results for input(s): GLUCAP in the last 168 hours. Lipid Profile: No results for input(s): CHOL, HDL, LDLCALC, TRIG, CHOLHDL, LDLDIRECT in the  last 72 hours. Thyroid Function Tests: No results for input(s): TSH, T4TOTAL, FREET4, T3FREE, THYROIDAB in the last 72 hours. Anemia Panel: No results for input(s): VITAMINB12, FOLATE, FERRITIN, TIBC, IRON, RETICCTPCT in the last 72 hours. Urine analysis:    Component Value Date/Time   COLORURINE YELLOW 12/13/2012 1201   APPEARANCEUR CLEAR 12/13/2012 1201   LABSPEC 1.020 12/13/2012 1201   PHURINE 7.0 12/13/2012 1201   GLUCOSEU 100 (A) 12/13/2012 1201   HGBUR NEGATIVE 12/13/2012 1201   BILIRUBINUR neg 05/09/2015 1000   KETONESUR NEGATIVE 12/13/2012 1201   PROTEINUR trace 05/09/2015 1000   PROTEINUR NEGATIVE 12/13/2012 1201   UROBILINOGEN negative 05/09/2015 1000   UROBILINOGEN 0.2 12/13/2012 1201   NITRITE neg 05/09/2015 1000  NITRITE NEGATIVE 12/13/2012 1201   LEUKOCYTESUR Negative 05/09/2015 1000   Sepsis Labs: @LABRCNTIP (procalcitonin:4,lacticidven:4) )No results found for this or any previous visit (from the past 240 hour(s)).   Radiological Exams on Admission: Dg Chest 2 View  Result Date: 11/29/2015 CLINICAL DATA:  LEFT chest tightness and pressure radiating to arm for 1 day. History of diabetes, asbestosis, former smoker. EXAM: CHEST  2 VIEW COMPARISON:  Chest radiograph July 06, 2014 FINDINGS: Cardiomediastinal silhouette is normal. No pleural effusions or focal consolidations. Mild bronchitic changes increased lung volumes with flattened hemidiaphragms. Azygos fissure. Trachea projects midline and there is no pneumothorax. Soft tissue planes and included osseous structures are non-suspicious. Mild degenerative change of the thoracic spine. IMPRESSION: Mild COPD, no superimposed acute cardiopulmonary process. Electronically Signed   By: Awilda Metro M.D.   On: 11/29/2015 20:32    EKG: Independently reviewed. Normal sinus rhythm with nonspecific ST changes in inferior leads.  Assessment/Plan Principal Problem:   Chest pain Active Problems:   Diabetes mellitus type  2, controlled (HCC)   Hyperlipidemia    1. Chest pain concerning for angina - chest pain-free at this time. We'll cycle cardiac markers. Check 2-D echo. Aspirin. Will keep patient nothing by mouth in anticipation of cardiac procedure in a.m. Please consult cardiologist in a.m. NTG PRN. 2. Diabetes mellitus type 2 - will hold metformin in anticipation of cardiac procedure. Patient is on Jardiance. Patient is refusing insulin sliding scale so I have placed patient on Tradjenta. 3. Sleep apnea on CPAP. 4. COPD - presently not wheezing continue inhalers. Patient quit smoking last year. 5. Gallstones - if cardiac workup is negative then we need to address gallstones.  Patient states he is on lisinopril for renal protection.  Note that patient does not want to be on insulin sliding scale.   DVT prophylaxis: Lovenox. Code Status: Full code.  Family Communication: Discussed with patient.  Disposition Plan: Home.  Consults called: None.  Admission status: Observation.    Eduard Clos MD [facility] Pager [phone].  If 7PM-7AM, please contact night-coverage [email or web address] Password [email or web address]  11/29/2015, 11:06 PM

## 2015-11-29 NOTE — ED Notes (Signed)
[place] sandwich and diet coke provided

## 2015-11-29 NOTE — ED Notes (Signed)
Pt requesting nightly metformin dose

## 2015-11-30 ENCOUNTER — Observation Stay (HOSPITAL_COMMUNITY): Payer: BLUE CROSS/BLUE SHIELD

## 2015-11-30 ENCOUNTER — Observation Stay (HOSPITAL_BASED_OUTPATIENT_CLINIC_OR_DEPARTMENT_OTHER): Payer: BLUE CROSS/BLUE SHIELD

## 2015-11-30 DIAGNOSIS — E119 Type 2 diabetes mellitus without complications: Secondary | ICD-10-CM | POA: Diagnosis not present

## 2015-11-30 DIAGNOSIS — R079 Chest pain, unspecified: Secondary | ICD-10-CM

## 2015-11-30 DIAGNOSIS — R0789 Other chest pain: Secondary | ICD-10-CM

## 2015-11-30 LAB — NM MYOCAR MULTI W/SPECT W/WALL MOTION / EF
CHL CUP NUCLEAR SRS: 3
CHL CUP NUCLEAR SSS: 6
CHL CUP RESTING HR STRESS: 75 {beats}/min
CSEPED: 8 min
CSEPEDS: 30 s
CSEPEW: 7 METS
CSEPHR: 80 %
LV dias vol: 128 mL (ref 62–150)
LV sys vol: 62 mL
MPHR: 160 {beats}/min
NUC STRESS TID: 1.01
Peak HR: 131 {beats}/min
RATE: 0.23
SDS: 3

## 2015-11-30 MED ORDER — TECHNETIUM TC 99M TETROFOSMIN IV KIT
10.0000 | PACK | Freq: Once | INTRAVENOUS | Status: AC | PRN
  Administered 2015-11-30: 10 via INTRAVENOUS

## 2015-11-30 MED ORDER — TECHNETIUM TC 99M TETROFOSMIN IV KIT
30.0000 | PACK | Freq: Once | INTRAVENOUS | Status: AC | PRN
Start: 2015-11-30 — End: 2015-11-30
  Administered 2015-11-30: 30 via INTRAVENOUS

## 2015-11-30 NOTE — Progress Notes (Signed)
  Patient's stress test showed:   Blood pressure demonstrated a hypertensive response to exercise.  Upsloping ST segment depression ST segment depression was noted during stress in the III, II, aVF, V4, V5 and V6 leads, beginning at 6 minutes of stress, and returning to baseline after 1-5 minutes of recovery.  This is a low risk study.  The left ventricular ejection fraction is mildly decreased (45-54%).   1. No evidence for ischemia/infarction by perfusion images.  2. EF 52%, mild diffuse hypokinesis.  3. Good exercise tolerance with nonspecific upsloping ST depression noted.  4. Overall, low risk study.    Good for discharge from Cardiology perspective. Reviewed results with patient. Will cancel outpatient stress test. Admitting team made aware.  Signed, Ellsworth LennoxBrittany M Dalyn Kjos, PA-C 11/30/2015, 4:30 PM Pager: [phone]

## 2015-11-30 NOTE — Discharge Summary (Signed)
Discharge Summary  Scott [name] HKV:[id number] DOB: 12-23-1955  PCP: Rudi Heap, MD  Admit date: 11/29/2015 Discharge date: 11/30/2015  Time spent: <66mins  Recommendations for Outpatient Follow-up:  1. F/u with PMD within a week  for hospital discharge follow up, repeat cbc/bmp at follow up 2. F/u with general surgery for gallstone 3. No new meds, continue all home meds  Discharge Diagnoses:  Active Hospital Problems   Diagnosis Date Noted  . Chest pain 11/29/2015  . Hyperlipidemia 02/12/2013  . Diabetes mellitus type 2, controlled (HCC) 06/03/2012    Resolved Hospital Problems   Diagnosis Date Noted Date Resolved  No resolved problems to display.    Discharge Condition: stable  Diet recommendation: heart healthy/carb modified  Filed Weights   11/29/15 1920 11/30/15 0037  Weight: 108.4 kg (239 lb) 109.1 kg (240 lb 9.6 oz)    History of present illness:  Chief Complaint: Chest pain.  HPI: Scott [name] is a 60 y.o. male with diabetes mellitus type 2, COPD, sleep apnea and history of tobacco abuse presents to the ER because of chest pain. Patient has been having chest pain off and on for last 2 weeks. Patient had followed up with cardiologist last week and is scheduled to have stress test done as outpatient within the next week. This afternoon after having lunch patient started developing chest pressure while walking to his truck. Patient went home and tried to sleep it off. But when woke up patient still had chest pressure. EMS was called and patient was brought to the ER. After the patient was given sublingual nitroglycerin chest pain improved and eventually resolved. Cardiac markers were negative and chest x-ray was showing nothing acute. EKG was showing nonspecific finding. Patient is being admitted for further management of chest pain. During the chest pain episode patient had mild nausea but denies any vomiting or abdominal pain. Abdomen appears benign on exam.  It is to be noted that patient has a history of gallstones.  ED Course: See history of presenting illness. EKG chest x-ray and cardiac markers were showing nothing acute.  Hospital Course:  Principal Problem:   Chest pain Active Problems:   Diabetes mellitus type 2, controlled (HCC)   Hyperlipidemia  1. Chest pain concerning for angina - chest pain-free at this time. negative cardiac markersx3. Check 2-D echo pending, no acute ekg changes. Lipid panel in 09/2015 ldl 83, triglyceride 254, F6E in 04/2015 7.3, continue Aspirin and fenofibrate. Cardiology consulted, stress test low risk, he is cleared to discharge home by cardiology. 2. noninsulin dependent Diabetes mellitus type 2- will hold metformin in anticipation of cardiac procedure. Patient is on SGLT inhibitor Jardiance. Patient is refusing insulin sliding scale,  patient is started on Tradjenta on admission, he is discharge on home meds, no new meds, he is to follow with pmd to continue adjust diabetes meds 3. Sleep apneaon CPAP. 4. COPD- presently not wheezing continue inhalers. Patient quit smoking last year. 5. Gallstones-cardiac stress test low risk, patient is advised to follow up with generally surgery for gallstone, he is symptom free at discharge 6. Body mass index is 33.56 kg/m. patient report he is actively working on weight loss by exercise daily and diet modification    DVT prophylaxis while in the hospital:Lovenox. Code Status:Full code. Family Communication:Discussed with patient and wife at bedside Disposition Plan:Home  Consults called:cardiology.   Procedures:  Cardiac stress test on 9/20, low risk test  Antibiotics:  none   Discharge Exam: BP (!) 168/69 (BP Location: Left  Arm)   Pulse 83 Comment: test end  Temp 97.6 F (36.4 C) (Oral)   Resp 20   Ht 5\' 11"  (1.803 m)   Wt 109.1 kg (240 lb 9.6 oz)   SpO2 99%   BMI 33.56 kg/m   General: NAD Cardiovascular: RRR Respiratory:  CTABL  Discharge Instructions You were cared for by a hospitalist during your hospital stay. If you have any questions about your discharge medications or the care you received while you were in the hospital after you are discharged, you can call the unit and asked to speak with the hospitalist on call if the hospitalist that took care of you is not available. Once you are discharged, your primary care physician will handle any further medical issues. Please note that NO REFILLS for any discharge medications will be authorized once you are discharged, as it is imperative that you return to your primary care physician (or establish a relationship with a primary care physician if you do not have one) for your aftercare needs so that they can reassess your need for medications and monitor your lab values.  Discharge Instructions    Diet - low sodium heart healthy    Complete by:  As directed    Carb modified   Increase activity slowly    Complete by:  As directed        Medication List    TAKE these medications   albuterol 108 (90 Base) MCG/ACT inhaler Commonly known as:  PROVENTIL HFA;VENTOLIN HFA Inhale 2 puffs into the lungs every 6 (six) hours as needed for wheezing.   alum & mag hydroxide-simeth 200-200-20 MG/5ML suspension Commonly known as:  MAALOX/MYLANTA Take 30 mLs by mouth every 6 (six) hours as needed for indigestion or heartburn.   aspirin 81 MG tablet Take 1 tablet (81 mg total) by mouth daily.   cycloSPORINE 0.05 % ophthalmic emulsion Commonly known as:  RESTASIS Place 1 drop into both eyes 2 (two) times daily.   empagliflozin 25 MG Tabs tablet Commonly known as:  JARDIANCE Take 25 mg by mouth daily.   fenofibrate 160 MG tablet Take 1 tablet (160 mg total) by mouth daily.   lisinopril 5 MG tablet Commonly known as:  PRINIVIL,ZESTRIL Take 1 tablet (5 mg total) by mouth daily.   metFORMIN 500 MG tablet Commonly known as:  GLUCOPHAGE TAKE 2 TABLETS (1,000 MG  TOTAL) BY MOUTH 2 (TWO) TIMES DAILY.   Vitamin D (Ergocalciferol) 50000 units Caps capsule Commonly known as:  DRISDOL TAKE ONE CAPSULE EVERY SEVEN DAYS What changed:  how much to take  how to take this  when to take this  additional instructions      Allergies  Allergen Reactions  . Augmentin [Amoxicillin-Pot Clavulanate] Itching and Nausea And Vomiting  . Codeine Itching and Nausea And Vomiting  . Penicillins Hives and Itching  . Atorvastatin     Myalgias and fatigue  . Oxycontin [Oxycodone Hcl] Itching   Follow-up Information    Rudi Heap, MD Follow up in 1 week(s).   Specialty:  Family Medicine Why:  hospital Contact information: [address] [place] [address] [phone]        Velora Heckler, MD Follow up in 1 month(s).   Specialty:  General Surgery Contact information: [address] [place] [address] [phone]            The results of significant diagnostics from this hospitalization (including imaging, microbiology, ancillary and laboratory) are listed below  for reference.    Significant Diagnostic Studies: Dg Chest 2 View  Result Date: 11/29/2015 CLINICAL DATA:  LEFT chest tightness and pressure radiating to arm for 1 day. History of diabetes, asbestosis, former smoker. EXAM: CHEST  2 VIEW COMPARISON:  Chest radiograph July 06, 2014 FINDINGS: Cardiomediastinal silhouette is normal. No pleural effusions or focal consolidations. Mild bronchitic changes increased lung volumes with flattened hemidiaphragms. Azygos fissure. Trachea projects midline and there is no pneumothorax. Soft tissue planes and included osseous structures are non-suspicious. Mild degenerative change of the thoracic spine. IMPRESSION: Mild COPD, no superimposed acute cardiopulmonary process. Electronically Signed   By: Awilda Metroourtnay  Bloomer M.D.   On: 11/29/2015 20:32   Nm Myocar Multi W/spect W/wall Motion / Ef  Result Date: 11/30/2015  Blood pressure  demonstrated a hypertensive response to exercise.  Upsloping ST segment depression ST segment depression was noted during stress in the III, II, aVF, V4, V5 and V6 leads, beginning at 6 minutes of stress, and returning to baseline after 1-5 minutes of recovery.  This is a low risk study.  The left ventricular ejection fraction is mildly decreased (45-54%).  1. No evidence for ischemia/infarction by perfusion images. 2. EF 52%, mild diffuse hypokinesis. 3. Good exercise tolerance with nonspecific upsloping ST depression noted. 4. Overall, low risk study.    Microbiology: No results found for this or any previous visit (from the past 240 hour(s)).   Labs: Basic Metabolic Panel:  Recent Labs Lab 11/29/15 1916  NA 137  K 3.9  CL 108  CO2 21*  GLUCOSE 109*  BUN 21*  CREATININE 1.08  CALCIUM 8.9   Liver Function Tests: No results for input(s): AST, ALT, ALKPHOS, BILITOT, PROT, ALBUMIN in the last 168 hours. No results for input(s): LIPASE, AMYLASE in the last 168 hours. No results for input(s): AMMONIA in the last 168 hours. CBC:  Recent Labs Lab 11/29/15 1916  WBC 7.5  HGB 14.2  HCT 42.5  MCV 91.0  PLT 226   Cardiac Enzymes: No results for input(s): CKTOTAL, CKMB, CKMBINDEX, TROPONINI in the last 168 hours. BNP: BNP (last 3 results) No results for input(s): BNP in the last 8760 hours.  ProBNP (last 3 results) No results for input(s): PROBNP in the last 8760 hours.  CBG: No results for input(s): GLUCAP in the last 168 hours.     SignedAlbertine Grates:  Berkleigh Beckles MD, PhD  [facility] 11/30/2015, 5:22 PM

## 2015-11-30 NOTE — Consult Note (Signed)
Cardiology Consult    Patient ID: Scott [name] MRN: [id number], DOB/AGE: February 20, 1956   Admit date: 11/29/2015 Date of Consult: 11/30/2015  Primary Physician: Scott Heap, MD Reason for Consult: Chest Pain Primary Cardiologist: Dr. Delrae Sawyers Requesting Provider: Dr. Roda [name]   History of Present Illness    Scott [name] is a 60 y.o. male with past medical history of Type 2 DM, HLD, OSA, and prior tobacco abuse who presented to [facility] ED on 11/29/2015 for evaluation of chest pain.   He reports going for walks daily and had ambulated without any symptoms the day of his presentation. Upon driving in his car, he developed a pressure along his chest. He went home and tried to rest, hoping the pain would resolve but it did not which prompted him to come to the ER. Pain resolved upon arrival to the ER. Says SL NTG did not help, as his pain was mostly gone by then. Denies any associated nausea, vomiting, dyspnea, or diaphoresis.   Initial labs showed a WBC of 7.5, Hgb 14.2, and platelets 226. K+ 3.9. Creatinine 1.08. Initial two troponin values negative. EKG showed NSR, HR 74, with no acute ST or T-wave changes. He was admitted for ACS rule-out.  This morning, he denies any recurrent chest pain. Feeling well. Was seen by Dr. Antoine [name] on 11/23/2015 for evaluation of chest discomfort and dyspnea with exertion. A POET was ordered at that time but has not yet been performed.   Denies any personal history of known CAD. Family history is significant for CAD in his father and brother.   Past Medical History   Past Medical History:  Diagnosis Date  . Anxiety   . Asbestosis (HCC)   . Diabetes mellitus without complication (HCC)   . Dyslipidemia   . GERD (gastroesophageal reflux disease)   . Sleep apnea   . Tobacco abuse     Past Surgical History:  Procedure Laterality Date  . FOOT MASS EXCISION    . VASECTOMY       Allergies  Allergies  Allergen Reactions  . Augmentin  [Amoxicillin-Pot Clavulanate] Itching and Nausea And Vomiting  . Codeine Itching and Nausea And Vomiting  . Penicillins Hives and Itching  . Atorvastatin     Myalgias and fatigue  . Oxycontin [Oxycodone Hcl] Itching    Inpatient Medications    . aspirin EC  325 mg Oral Daily  . canagliflozin  100 mg Oral QAC breakfast  . cycloSPORINE  1 drop Both Eyes BID  . enoxaparin (LOVENOX) injection  40 mg Subcutaneous QHS  . fenofibrate  160 mg Oral Daily  . linagliptin  5 mg Oral Daily  . lisinopril  5 mg Oral Daily    Family History    Family History  Problem Relation Age of Onset  . Stroke Father     Died age 42  . CAD Father   . CAD Brother 27    CABG    Social History    Social History   Social History  . Marital status: Married    Spouse name: N/A  . Number of children: 2  . Years of education: N/A   Occupational History  . Plumber Self Employed   Social History Main Topics  . Smoking status: Former Smoker    Packs/day: 0.00    Types: Cigarettes    Quit date: 01/16/2013  . Smokeless tobacco: Never Used     Comment: He has patches.  . Alcohol use Yes  Comment: social  . Drug use: No  . Sexual activity: Not on file   Other Topics Concern  . Not on file   Social History Narrative   Lives at home with wife.      Review of Systems    General:  No chills, fever, night sweats or weight changes.  Cardiovascular:  No dyspnea on exertion, edema, orthopnea, palpitations, paroxysmal nocturnal dyspnea. Positive for chest pain.  Dermatological: No rash, lesions/masses Respiratory: No cough, dyspnea Urologic: No hematuria, dysuria Abdominal:   No nausea, vomiting, diarrhea, bright red blood per rectum, melena, or hematemesis Neurologic:  No visual changes, wkns, changes in mental status. All other systems reviewed and are otherwise negative except as noted above.  Physical Exam    Blood pressure 111/65, pulse 62, temperature 97.6 F (36.4 C), temperature  source Oral, resp. rate 20, height 5\' 11"  (1.803 m), weight 240 lb 9.6 oz (109.1 kg), SpO2 99 %.  General: Pleasant, overweight Caucasian male appearing in NAD Psych: Normal affect. Neuro: Alert and oriented X 3. Moves all extremities spontaneously. HEENT: Normal  Neck: Supple without bruits or JVD. Lungs:  Resp regular and unlabored, CTA without wheezing or rales. Heart: RRR no s3, s4, or murmurs. Abdomen: Soft, non-tender, non-distended, BS + x 4.  Extremities: No clubbing, cyanosis or edema. DP/PT/Radials 2+ and equal bilaterally.  Labs    Troponin [facility](Point of Care Test)  Recent Labs  11/29/15 2246  TROPIPOC 0.00   No results for input(s): CKTOTAL, CKMB, TROPONINI in the last 72 hours. Lab Results  Component Value Date   WBC 7.5 11/29/2015   HGB 14.2 11/29/2015   HCT 42.5 11/29/2015   MCV 91.0 11/29/2015   PLT 226 11/29/2015    Recent Labs Lab 11/29/15 1916  NA 137  K 3.9  CL 108  CO2 21*  BUN 21*  CREATININE 1.08  CALCIUM 8.9  GLUCOSE 109*   Lab Results  Component Value Date   CHOL 169 10/03/2015   HDL 35 (L) 10/03/2015   LDLCALC 83 10/03/2015   TRIG 254 (H) 10/03/2015   [facility] results found for: [facility]   Radiology Studies    Dg Chest 2 View  Result Date: 11/29/2015 CLINICAL DATA:  LEFT chest tightness and pressure radiating to arm for 1 day. History of diabetes, asbestosis, former smoker. EXAM: CHEST  2 VIEW COMPARISON:  Chest radiograph July 06, 2014 FINDINGS: Cardiomediastinal silhouette is normal. No pleural effusions or focal consolidations. Mild bronchitic changes increased lung volumes with flattened hemidiaphragms. Azygos fissure. Trachea projects midline and there is no pneumothorax. Soft tissue planes and included osseous structures are non-suspicious. Mild degenerative change of the thoracic spine. IMPRESSION: Mild COPD, no superimposed acute cardiopulmonary process. Electronically Signed   By: Scott [name]  Bloomer M.D.   On: 11/29/2015 20:32    EKG &  Cardiac Imaging    EKG: NSR, HR 74, with no acute ST or T-wave changes  Echocardiogram: Pending  Assessment & Plan    1. Chest Pain with Mixed Typical and Atypical Features - had been seen in the office recently for chest pain and dyspnea with exertion with a stress test ordered but not yet performed.  - developed chest pain yesterday following conclusion of activity. No active symptoms while exercising. No associated nausea, vomiting, dyspnea, or diaphoresis. Resolved spontaneously and no recurrence while admitted.  - initial two troponin values negative. EKG without acute ischemic changes.  - will plan for Treadmill Myoview to evaluate for ischemia. If low-risk, can likely  be discharged home and follow-up with Dr. Antoine [name] as an outpatient.   2. OSA - uses CPAP  3. IDDM - per admitting team   Signed, Ellsworth Lennox, PA-C 11/30/2015, 10:54 AM Pager: [phone]  Patient examined chart reviewed. Exam with exogenous obesity, Surgery to left hand with persistant Swelling. Normal cardiac exam and clear lungs. Agree with exercise myovue to risk stratify for CAD Given risk factors and ER visit.  Troponins negative and no acute ECG changes  Charlton Haws

## 2015-11-30 NOTE — Progress Notes (Signed)
Discussed with the patient and all questioned fully answered. He will call me if any problems arise.  IV removed. Telemetry removed.   Kasheena Sambrano S [place], RN   

## 2015-11-30 NOTE — ED Notes (Signed)
No lab draw,  Pt enroute to inpatient floor. 

## 2015-11-30 NOTE — Progress Notes (Signed)
PROGRESS NOTE  Brynda Rimhomas Anderle [id number] DOB: [birth year] DOA: 11/29/2015 PCP: Rudi HeapMOORE, DONALD, MD  HPI/Recap of past 24 hours:  Returned from stress test , feeling better, wife at bedside  Assessment/Plan: Principal Problem:   Chest pain Active Problems:   Diabetes mellitus type 2, controlled (HCC)   Hyperlipidemia  1. Chest pain concerning for angina - chest pain-free at this time. negative cardiac markersx3. Check 2-D echo pending, no acute ekg changes. Lipid panel in 09/2015 ldl 83, triglyceride 254, W1Xa1c in 04/2015 7.3, continue Aspirin and fenofibrate. Cardiology consulted, stress test done this am, result pending 2. noninsulin dependent Diabetes mellitus type 2 - will hold metformin in anticipation of cardiac procedure. Patient is on SGLT inhibitor Jardiance. Patient is refusing insulin sliding scale,  patient is started on Tradjenta on admission 3. Sleep apnea on CPAP. 4. COPD - presently not wheezing continue inhalers. Patient quit smoking last year. 5. Gallstones - if cardiac workup is negative then we need to address gallstones, add on lft, he is followed by generally surgery for this, if symptom free, need outpatient surgery follow up, if remain symptomatic, consider inpatient surgery consult 6. Body mass index is 33.56 kg/m. patient report he is actively working on weight loss by exercise daily and diet modification    DVT prophylaxis: Lovenox. Code Status: Full code.  Family Communication: Discussed with patient and wife at bedside Disposition Plan: Home if stress test negative, and no recurrent symptom,  Consults called: cardiology.    Procedures:  Cardiac stress test on 9/20  Antibiotics:  none   Objective: BP (!) 168/69 (BP Location: Left Arm)   Pulse 83 Comment: test end  Temp 97.6 F (36.4 C) (Oral)   Resp 20   Ht 5\' 11"  (1.803 m)   Wt 109.1 kg (240 lb 9.6 oz)   SpO2 99%   BMI 33.56 kg/m   Intake/Output Summary (Last 24 hours) at 11/30/15  1413 Last data filed at 11/30/15 0700  Gross per 24 hour  Intake              520 ml  Output                0 ml  Net              520 ml   Filed Weights   11/29/15 1920 11/30/15 0037  Weight: 108.4 kg (239 lb) 109.1 kg (240 lb 9.6 oz)    Exam:   General:  NAD  Cardiovascular: RRR  Respiratory: CTABL  Abdomen: Soft/ND/NT, positive BS  Musculoskeletal: No Edema  Neuro: aaox3  Data Reviewed: Basic Metabolic Panel:  Recent Labs Lab 11/29/15 1916  NA 137  K 3.9  CL 108  CO2 21*  GLUCOSE 109*  BUN 21*  CREATININE 1.08  CALCIUM 8.9   Liver Function Tests: No results for input(s): AST, ALT, ALKPHOS, BILITOT, PROT, ALBUMIN in the last 168 hours. No results for input(s): LIPASE, AMYLASE in the last 168 hours. No results for input(s): AMMONIA in the last 168 hours. CBC:  Recent Labs Lab 11/29/15 1916  WBC 7.5  HGB 14.2  HCT 42.5  MCV 91.0  PLT 226   Cardiac Enzymes:   No results for input(s): CKTOTAL, CKMB, CKMBINDEX, TROPONINI in the last 168 hours. BNP (last 3 results) No results for input(s): BNP in the last 8760 hours.  ProBNP (last 3 results) No results for input(s): PROBNP in the last 8760 hours.  CBG: No results for input(s): GLUCAP in the  last 168 hours.  No results found for this or any previous visit (from the past 240 hour(s)).   Studies: Dg Chest 2 View  Result Date: 11/29/2015 CLINICAL DATA:  LEFT chest tightness and pressure radiating to arm for 1 day. History of diabetes, asbestosis, former smoker. EXAM: CHEST  2 VIEW COMPARISON:  Chest radiograph July 06, 2014 FINDINGS: Cardiomediastinal silhouette is normal. No pleural effusions or focal consolidations. Mild bronchitic changes increased lung volumes with flattened hemidiaphragms. Azygos fissure. Trachea projects midline and there is no pneumothorax. Soft tissue planes and included osseous structures are non-suspicious. Mild degenerative change of the thoracic spine. IMPRESSION: Mild  COPD, no superimposed acute cardiopulmonary process. Electronically Signed   By: Awilda Metro M.D.   On: 11/29/2015 20:32    Scheduled Meds: . aspirin EC  325 mg Oral Daily  . canagliflozin  100 mg Oral QAC breakfast  . cycloSPORINE  1 drop Both Eyes BID  . enoxaparin (LOVENOX) injection  40 mg Subcutaneous QHS  . fenofibrate  160 mg Oral Daily  . linagliptin  5 mg Oral Daily  . lisinopril  5 mg Oral Daily    Continuous Infusions: . sodium chloride Stopped (11/30/15 0955)     Time spent:  Davan Hark MD, PhD  [facility] Pager [phone]. If 7PM-7AM, please contact night-coverage at [email or web address], password [facility] 11/30/2015, 2:13 PM  LOS: 0 days

## 2015-12-21 ENCOUNTER — Ambulatory Visit: Payer: Self-pay | Admitting: Surgery

## 2015-12-21 DIAGNOSIS — K8020 Calculus of gallbladder without cholecystitis without obstruction: Secondary | ICD-10-CM | POA: Diagnosis not present

## 2015-12-28 ENCOUNTER — Ambulatory Visit: Payer: BLUE CROSS/BLUE SHIELD | Admitting: Cardiology

## 2015-12-30 ENCOUNTER — Other Ambulatory Visit: Payer: Self-pay | Admitting: Family Medicine

## 2016-01-03 NOTE — Patient Instructions (Addendum)
Scott [name]  01/03/2016   Your procedure is scheduled on: 01-10-16  [facility]  [facility] 3rd floor to  Short Stay Center at 515  AM.  Call this number if you have problems the morning of surgery [phone]   Remember: ONLY 1 PERSON MAY GO WITH YOU TO SHORT STAY TO GET  READY MORNING OF YOUR SURGERY.  Do not eat food or drink liquids :After Midnight.  Bring cpap mask and tubing   Take these medicines the morning of surgery with A SIP OF WATER: ALBUTEROL INHALER IF NEEDED AND BRING INHALER, RESTASIS EYE DROP, FENOFIBRATE DO NOT TAKE ANY DIABETIC MEDICATIONS DAY OF YOUR SURGERY                               You may not have any metal on your body including hair pins and              piercings  Do not wear jewelry, make-up, lotions, powders or perfumes, deodorant             Do not wear nail polish.  Do not shave  48 hours prior to surgery.              Men may shave face and neck.   Do not bring valuables to the hospital. [facility] IS NOT             RESPONSIBLE   FOR VALUABLES.  Contacts, dentures or bridgework may not be worn into surgery.  Leave suitcase in the car. After surgery it may be brought to your room.                Please read over the following fact sheets you were given: _____________________________________________________________________             How to Manage Your Diabetes Before and After Surgery  Why is it important to control my blood sugar before and after surgery? . Improving blood sugar levels before and after surgery helps healing and can limit problems. . A way of improving blood sugar control is eating a healthy diet by: o  Eating less sugar and carbohydrates o  Increasing activity/exercise o  Talking with your doctor about reaching your blood sugar goals . High blood sugars (greater than 180 mg/dL) can raise your risk of infections and slow your recovery, so you will need to  focus on controlling your diabetes during the weeks before surgery. . Make sure that the doctor who takes care of your diabetes knows about your planned surgery including the date and location.  How do I manage my blood sugar before surgery? . Check your blood sugar at least 4 times a day, starting 2 days before surgery, to make sure that the level is not too high or low. o Check your blood sugar the morning of your surgery when you wake up and every 2 hours until you get to the Short Stay unit. . If your blood sugar is less than 70 mg/dL, you will need to treat for low blood sugar: o Do not take insulin. o Treat a low blood sugar (less than 70 mg/dL) with  cup of clear juice (cranberry or apple), 4 glucose tablets, OR glucose gel. o Recheck blood sugar in 15 minutes after treatment (to make sure  it is greater than 70 mg/dL). If your blood sugar is not greater than 70 mg/dL on recheck, call [phone] for further instructions. . Report your blood sugar to the short stay nurse when you get to Short Stay.  . If you are admitted to the hospital after surgery: o Your blood sugar will be checked by the staff and you will probably be given insulin after surgery (instead of oral diabetes medicines) to make sure you have good blood sugar levels. o The goal for blood sugar control after surgery is 80-180 mg/dL.   WHAT DO I DO ABOUT MY DIABETES MEDICATION?  Marland Kitchen Do not take oral diabetes medicines (pills) the morning of surgery.    Patient Signature:  Date:   Nurse Signature:  Date:   Reviewed and Endorsed by [facility] Patient Education Committee, August 2015[facility] - Preparing for Surgery Before surgery, you can play an important role.  Because skin is not sterile, your skin needs to be as free of germs as possible.  You can reduce the number of germs on your skin by washing with CHG (chlorahexidine gluconate) soap before surgery.  CHG is an antiseptic cleaner which kills germs and bonds  with the skin to continue killing germs even after washing. Please DO NOT use if you have an allergy to CHG or antibacterial soaps.  If your skin becomes reddened/irritated stop using the CHG and inform your nurse when you arrive at Short Stay. Do not shave (including legs and underarms) for at least 48 hours prior to the first CHG shower.  You may shave your face/neck. Please follow these instructions carefully:  1.  Shower with CHG Soap the night before surgery and the  morning of Surgery.  2.  If you choose to wash your hair, wash your hair first as usual with your  normal  shampoo.  3.  After you shampoo, rinse your hair and body thoroughly to remove the  shampoo.                           4.  Use CHG as you would any other liquid soap.  You can apply chg directly  to the skin and wash                       Gently with a scrungie or clean washcloth.  5.  Apply the CHG Soap to your body ONLY FROM THE NECK DOWN.   Do not use on face/ open                           Wound or open sores. Avoid contact with eyes, ears mouth and genitals (private parts).                       Wash face,  Genitals (private parts) with your normal soap.             6.  Wash thoroughly, paying special attention to the area where your surgery  will be performed.  7.  Thoroughly rinse your body with warm water from the neck down.  8.  DO NOT shower/wash with your normal soap after using and rinsing off  the CHG Soap.                9.  Pat yourself dry with a clean towel.  10.  Wear clean pajamas.            11.  Place clean sheets on your bed the night of your first shower and do not  sleep with pets. Day of Surgery : Do not apply any lotions/deodorants the morning of surgery.  Please wear clean clothes to the hospital/surgery center.  FAILURE TO FOLLOW THESE INSTRUCTIONS MAY RESULT IN THE CANCELLATION OF YOUR SURGERY PATIENT SIGNATURE_________________________________  NURSE  SIGNATURE__________________________________  ________________________________________________________________________

## 2016-01-05 ENCOUNTER — Encounter (HOSPITAL_COMMUNITY)
Admission: RE | Admit: 2016-01-05 | Discharge: 2016-01-05 | Disposition: A | Discharge status: Home / Self Care | Payer: BLUE CROSS/BLUE SHIELD | Source: Ambulatory Visit | Attending: Surgery | Admitting: Surgery

## 2016-01-05 ENCOUNTER — Encounter (HOSPITAL_COMMUNITY): Payer: Self-pay

## 2016-01-05 DIAGNOSIS — E119 Type 2 diabetes mellitus without complications: Secondary | ICD-10-CM | POA: Diagnosis not present

## 2016-01-05 DIAGNOSIS — Z01818 Encounter for other preprocedural examination: Secondary | ICD-10-CM | POA: Insufficient documentation

## 2016-01-05 HISTORY — DX: Anesthesia of skin: R20.0

## 2016-01-05 HISTORY — DX: Unspecified osteoarthritis, unspecified site: M19.90

## 2016-01-05 LAB — CBC
HCT: 44.9 % (ref 39.0–52.0)
Hemoglobin: 15.2 g/dL (ref 13.0–17.0)
MCH: 30.6 pg (ref 26.0–34.0)
MCHC: 33.9 g/dL (ref 30.0–36.0)
MCV: 90.5 fL (ref 78.0–100.0)
PLATELETS: 234 10*3/uL (ref 150–400)
RBC: 4.96 MIL/uL (ref 4.22–5.81)
RDW: 12.5 % (ref 11.5–15.5)
WBC: 7.2 10*3/uL (ref 4.0–10.5)

## 2016-01-05 LAB — BASIC METABOLIC PANEL
Anion gap: 8 (ref 5–15)
BUN: 19 mg/dL (ref 6–20)
CALCIUM: 9.6 mg/dL (ref 8.9–10.3)
CHLORIDE: 107 mmol/L (ref 101–111)
CO2: 22 mmol/L (ref 22–32)
CREATININE: 0.92 mg/dL (ref 0.61–1.24)
GFR calc non Af Amer: >60 mL/min (ref >60)
Glucose, Bld: 106 mg/dL — ABNORMAL HIGH (ref 65–99)
Potassium: 4.2 mmol/L (ref 3.5–5.1)
Sodium: 137 mmol/L (ref 135–145)

## 2016-01-05 LAB — GLUCOSE, CAPILLARY: Glucose-Capillary: 101 mg/dL — ABNORMAL HIGH (ref 65–99)

## 2016-01-06 LAB — HEMOGLOBIN A1C
HEMOGLOBIN A1C: 6.9 % — AB (ref 4.8–5.6)
MEAN PLASMA GLUCOSE: 151 mg/dL

## 2016-01-06 NOTE — Progress Notes (Signed)
Dr hochrein 11-23-15 lov epic Chest xray 11-29-15 epic Stress test  11-30-15 epic ekg 11-29-15 epic

## 2016-01-09 ENCOUNTER — Encounter (HOSPITAL_COMMUNITY): Payer: Self-pay | Admitting: Surgery

## 2016-01-09 DIAGNOSIS — K8010 Calculus of gallbladder with chronic cholecystitis without obstruction: Secondary | ICD-10-CM | POA: Diagnosis present

## 2016-01-09 NOTE — H&P (Signed)
General Surgery [facility]  Brynda Rimhomas Sofia DOB: 19-Apr-1955 Married / Language: English / Race: White Male  History of Present Illness  The patient is a 60 year old male who presents for evaluation of gall stones.  Patient returns to my practice to discuss cholecystectomy. Patient was originally evaluated in March 2016. At that time he had only had one episode of true biliary colic and wanted to hold off on surgical intervention. Over the past 18 months the patient has had intermittent symptoms with chest pain, right-sided abdominal pain, and back pain. Some of this may have been related to cholelithiasis. Patient did have a CT scan in January 2017 which showed severe diffuse hepatic steatosis. There were layering calcified gallstones measuring up to 9 mm in a nondistended gallbladder. Bile ducts appeared normal. Patient had an episode of chest pain approximately 3 weeks ago. He underwent stress test. This was negative. Patient now presents to discuss cholecystectomy for symptomatic cholelithiasis.   Allergies Augmentin *PENICILLINS* Codeine Phosphate *ANALGESICS - OPIOID* Atorvastatin Calcium *ANTIHYPERLIPIDEMICS*  Medication History  Vitamin D (Ergocalciferol) (50000UNIT Capsule, Oral) Active. MetroNIDAZOLE (500MG  Tablet, Oral) Active. Clindamycin HCl (300MG  Capsule, Oral) Active. MetFORMIN HCl (500MG  Tablet, Oral) Active. Lisinopril (5MG  Tablet, Oral) Active. Naproxen (500MG  Tablet, Oral) Active. Medications Reconciled Aspirin (81MG  Tablet, Oral) Active.  Vitals Weight: 239 lb Height: 70in Body Surface Area: 2.25 m Body Mass Index: 34.29 kg/m  Temp.: 98.65F(Temporal)  Pulse: 76 (Regular)  BP: 132/76 (Sitting, Left Arm, Standard)  Physical Exam The physical exam findings are as follows: Note:General - appears comfortable, no distress; not diaphorectic  HEENT - normocephalic; sclerae clear, gaze conjugate; mucous  membranes moist, dentition good; voice normal  Neck - symmetric on extension; no palpable anterior or posterior cervical adenopathy; no palpable masses in the thyroid bed  Chest - clear bilaterally without rhonchi, rales, or wheeze  Cor - regular rhythm with normal rate; no significant murmur  Abd - soft without distension; no tenderness; no palpable masses; no hepatosplenomegaly  Ext - non-tender without significant edema or lymphedema  Neuro - grossly intact; no tremor   Assessment & Plan  CALCULUS OF GALLBLADDER WITHOUT CHOLECYSTITIS WITHOUT OBSTRUCTION (K80.20)  Patient has developed recurrent symptoms which may be related to his underlying cholelithiasis. He would like to proceed with cholecystectomy at this time.  Patient I again discussed laparoscopic cholecystectomy. We discussed the hospital stay to be anticipated and his postoperative recovery.  The risks and benefits of the procedure have been discussed at length with the patient. The patient understands the proposed procedure, potential alternative treatments, and the course of recovery to be expected. All of the patient's questions have been answered at this time. The patient wishes to proceed with surgery.  Velora Hecklerodd M. Shalae Belmonte, MD, [facility] Office: [phone]

## 2016-01-10 ENCOUNTER — Ambulatory Visit (HOSPITAL_COMMUNITY): Payer: BLUE CROSS/BLUE SHIELD | Admitting: Certified Registered Nurse Anesthetist

## 2016-01-10 ENCOUNTER — Ambulatory Visit (HOSPITAL_COMMUNITY): Payer: BLUE CROSS/BLUE SHIELD

## 2016-01-10 ENCOUNTER — Encounter (HOSPITAL_COMMUNITY): Payer: Self-pay | Admitting: *Deleted

## 2016-01-10 ENCOUNTER — Encounter (HOSPITAL_COMMUNITY)
Admission: RE | Disposition: A | Discharge status: Home / Self Care | Payer: Self-pay | Source: Ambulatory Visit | Attending: Surgery

## 2016-01-10 ENCOUNTER — Observation Stay (HOSPITAL_COMMUNITY)
Admission: RE | Admit: 2016-01-10 | Discharge: 2016-01-10 | Disposition: A | Discharge status: Home / Self Care | Payer: BLUE CROSS/BLUE SHIELD | Source: Ambulatory Visit | Attending: Surgery | Admitting: Surgery

## 2016-01-10 DIAGNOSIS — E119 Type 2 diabetes mellitus without complications: Secondary | ICD-10-CM | POA: Diagnosis not present

## 2016-01-10 DIAGNOSIS — Z87891 Personal history of nicotine dependence: Secondary | ICD-10-CM | POA: Diagnosis not present

## 2016-01-10 DIAGNOSIS — K760 Fatty (change of) liver, not elsewhere classified: Secondary | ICD-10-CM | POA: Insufficient documentation

## 2016-01-10 DIAGNOSIS — F419 Anxiety disorder, unspecified: Secondary | ICD-10-CM | POA: Diagnosis not present

## 2016-01-10 DIAGNOSIS — G4730 Sleep apnea, unspecified: Secondary | ICD-10-CM | POA: Diagnosis not present

## 2016-01-10 DIAGNOSIS — Z881 Allergy status to other antibiotic agents status: Secondary | ICD-10-CM | POA: Insufficient documentation

## 2016-01-10 DIAGNOSIS — Z880 Allergy status to penicillin: Secondary | ICD-10-CM | POA: Diagnosis not present

## 2016-01-10 DIAGNOSIS — Z7982 Long term (current) use of aspirin: Secondary | ICD-10-CM | POA: Diagnosis not present

## 2016-01-10 DIAGNOSIS — Z885 Allergy status to narcotic agent status: Secondary | ICD-10-CM | POA: Insufficient documentation

## 2016-01-10 DIAGNOSIS — K219 Gastro-esophageal reflux disease without esophagitis: Secondary | ICD-10-CM | POA: Insufficient documentation

## 2016-01-10 DIAGNOSIS — Z6833 Body mass index (BMI) 33.0-33.9, adult: Secondary | ICD-10-CM | POA: Insufficient documentation

## 2016-01-10 DIAGNOSIS — Z7984 Long term (current) use of oral hypoglycemic drugs: Secondary | ICD-10-CM | POA: Insufficient documentation

## 2016-01-10 DIAGNOSIS — J449 Chronic obstructive pulmonary disease, unspecified: Secondary | ICD-10-CM | POA: Insufficient documentation

## 2016-01-10 DIAGNOSIS — K8010 Calculus of gallbladder with chronic cholecystitis without obstruction: Principal | ICD-10-CM | POA: Insufficient documentation

## 2016-01-10 DIAGNOSIS — I10 Essential (primary) hypertension: Secondary | ICD-10-CM | POA: Diagnosis not present

## 2016-01-10 DIAGNOSIS — Z888 Allergy status to other drugs, medicaments and biological substances status: Secondary | ICD-10-CM | POA: Diagnosis not present

## 2016-01-10 DIAGNOSIS — K8020 Calculus of gallbladder without cholecystitis without obstruction: Secondary | ICD-10-CM

## 2016-01-10 DIAGNOSIS — K8080 Other cholelithiasis without obstruction: Secondary | ICD-10-CM | POA: Diagnosis not present

## 2016-01-10 DIAGNOSIS — R109 Unspecified abdominal pain: Secondary | ICD-10-CM | POA: Diagnosis not present

## 2016-01-10 HISTORY — PX: CHOLECYSTECTOMY: SHX55

## 2016-01-10 LAB — GLUCOSE, CAPILLARY
GLUCOSE-CAPILLARY: 137 mg/dL — AB (ref 65–99)
GLUCOSE-CAPILLARY: 134 mg/dL — AB (ref 65–99)
Glucose-Capillary: 157 mg/dL — ABNORMAL HIGH (ref 65–99)

## 2016-01-10 SURGERY — LAPAROSCOPIC CHOLECYSTECTOMY WITH INTRAOPERATIVE CHOLANGIOGRAM
Anesthesia: General | Site: Abdomen

## 2016-01-10 MED ORDER — SUGAMMADEX SODIUM 200 MG/2ML IV SOLN
INTRAVENOUS | Status: AC
  Filled 2016-01-10: qty 2

## 2016-01-10 MED ORDER — MIDAZOLAM HCL 2 MG/2ML IJ SOLN
INTRAMUSCULAR | Status: AC
Start: 2016-01-10 — End: 2016-01-10
  Filled 2016-01-10: qty 2

## 2016-01-10 MED ORDER — ACETAMINOPHEN 650 MG RE SUPP: 650.0000 mg | Freq: Four times a day (QID) | RECTAL | Status: DC | PRN

## 2016-01-10 MED ORDER — BUPIVACAINE HCL (PF) 0.5 % IJ SOLN
INTRAMUSCULAR | Status: DC | PRN
  Administered 2016-01-10: 20 mL

## 2016-01-10 MED ORDER — IOPAMIDOL (ISOVUE-300) INJECTION 61%
INTRAVENOUS | Status: AC
  Filled 2016-01-10: qty 50

## 2016-01-10 MED ORDER — HYDROMORPHONE HCL 1 MG/ML IJ SOLN
1.0000 mg | INTRAMUSCULAR | Status: DC | PRN
  Administered 2016-01-10: 1 mg via INTRAVENOUS
  Filled 2016-01-10: qty 1

## 2016-01-10 MED ORDER — ONDANSETRON HCL 4 MG/2ML IJ SOLN
INTRAMUSCULAR | Status: AC
  Filled 2016-01-10: qty 2

## 2016-01-10 MED ORDER — ONDANSETRON HCL 4 MG/2ML IJ SOLN
4.0000 mg | Freq: Four times a day (QID) | INTRAMUSCULAR | Status: DC | PRN
Start: 2016-01-10 — End: 2016-01-10

## 2016-01-10 MED ORDER — FENTANYL CITRATE (PF) 250 MCG/5ML IJ SOLN
INTRAMUSCULAR | Status: AC
  Filled 2016-01-10: qty 5

## 2016-01-10 MED ORDER — ONDANSETRON HCL 4 MG/2ML IJ SOLN
INTRAMUSCULAR | Status: DC | PRN
  Administered 2016-01-10: 4 mg via INTRAVENOUS

## 2016-01-10 MED ORDER — ROCURONIUM BROMIDE 10 MG/ML (PF) SYRINGE
PREFILLED_SYRINGE | INTRAVENOUS | Status: AC
  Filled 2016-01-10: qty 10

## 2016-01-10 MED ORDER — HYDROMORPHONE HCL 1 MG/ML IJ SOLN
0.2500 mg | INTRAMUSCULAR | Status: DC | PRN
  Administered 2016-01-10: 0.25 mg via INTRAVENOUS

## 2016-01-10 MED ORDER — CYCLOSPORINE 0.05 % OP EMUL
1.0000 [drp] | Freq: Two times a day (BID) | OPHTHALMIC | Status: DC
  Administered 2016-01-10: 1 [drp] via OPHTHALMIC
  Filled 2016-01-10: qty 1

## 2016-01-10 MED ORDER — LIDOCAINE 2% (20 MG/ML) 5 ML SYRINGE
INTRAMUSCULAR | Status: AC
  Filled 2016-01-10: qty 5

## 2016-01-10 MED ORDER — ONDANSETRON 4 MG PO TBDP: 4.0000 mg | ORAL_TABLET | Freq: Four times a day (QID) | ORAL | Status: DC | PRN

## 2016-01-10 MED ORDER — LACTATED RINGERS IR SOLN
Status: DC | PRN
  Administered 2016-01-10: 1000 mL

## 2016-01-10 MED ORDER — PROMETHAZINE HCL 25 MG/ML IJ SOLN: 6.2500 mg | INTRAMUSCULAR | Status: DC | PRN

## 2016-01-10 MED ORDER — CIPROFLOXACIN IN D5W 400 MG/200ML IV SOLN
INTRAVENOUS | Status: AC
  Filled 2016-01-10: qty 200

## 2016-01-10 MED ORDER — FENTANYL CITRATE (PF) 100 MCG/2ML IJ SOLN
INTRAMUSCULAR | Status: DC | PRN
  Administered 2016-01-10 (×4): 50 ug via INTRAVENOUS

## 2016-01-10 MED ORDER — LACTATED RINGERS IV SOLN
INTRAVENOUS | Status: DC | PRN
  Administered 2016-01-10 (×2): via INTRAVENOUS

## 2016-01-10 MED ORDER — SUGAMMADEX SODIUM 200 MG/2ML IV SOLN
INTRAVENOUS | Status: DC | PRN
  Administered 2016-01-10: 200 mg via INTRAVENOUS

## 2016-01-10 MED ORDER — POTASSIUM CHLORIDE IN NACL 20-0.9 MEQ/L-% IV SOLN
INTRAVENOUS | Status: DC
  Administered 2016-01-10: 11:00:00 via INTRAVENOUS
  Filled 2016-01-10: qty 1000

## 2016-01-10 MED ORDER — 0.9 % SODIUM CHLORIDE (POUR BTL) OPTIME
TOPICAL | Status: DC | PRN
  Administered 2016-01-10: 1000 mL

## 2016-01-10 MED ORDER — ACETAMINOPHEN 325 MG PO TABS: 650.0000 mg | ORAL_TABLET | Freq: Four times a day (QID) | ORAL | Status: DC | PRN

## 2016-01-10 MED ORDER — ROCURONIUM BROMIDE 10 MG/ML (PF) SYRINGE
PREFILLED_SYRINGE | INTRAVENOUS | Status: DC | PRN
  Administered 2016-01-10: 50 mg via INTRAVENOUS

## 2016-01-10 MED ORDER — MIDAZOLAM HCL 2 MG/2ML IJ SOLN: 0.5000 mg | Freq: Once | INTRAMUSCULAR | Status: DC | PRN

## 2016-01-10 MED ORDER — CIPROFLOXACIN IN D5W 400 MG/200ML IV SOLN
400.0000 mg | INTRAVENOUS | Status: AC
  Administered 2016-01-10: 400 mg via INTRAVENOUS

## 2016-01-10 MED ORDER — MEPERIDINE HCL 50 MG/ML IJ SOLN: 6.2500 mg | INTRAMUSCULAR | Status: DC | PRN

## 2016-01-10 MED ORDER — TRAMADOL HCL 50 MG PO TABS: 50.0000 mg | ORAL_TABLET | Freq: Four times a day (QID) | ORAL | 0 refills | Status: DC | PRN

## 2016-01-10 MED ORDER — PROPOFOL 10 MG/ML IV BOLUS
INTRAVENOUS | Status: DC | PRN
  Administered 2016-01-10: 200 mg via INTRAVENOUS

## 2016-01-10 MED ORDER — MIDAZOLAM HCL 5 MG/5ML IJ SOLN
INTRAMUSCULAR | Status: DC | PRN
  Administered 2016-01-10: 1 mg via INTRAVENOUS

## 2016-01-10 MED ORDER — SUCCINYLCHOLINE CHLORIDE 200 MG/10ML IV SOSY
PREFILLED_SYRINGE | INTRAVENOUS | Status: DC | PRN
  Administered 2016-01-10: 100 mg via INTRAVENOUS

## 2016-01-10 MED ORDER — HYDROMORPHONE HCL 1 MG/ML IJ SOLN
INTRAMUSCULAR | Status: AC
  Filled 2016-01-10: qty 1

## 2016-01-10 MED ORDER — PROPOFOL 10 MG/ML IV BOLUS
INTRAVENOUS | Status: AC
  Filled 2016-01-10: qty 20

## 2016-01-10 MED ORDER — LISINOPRIL 5 MG PO TABS
5.0000 mg | ORAL_TABLET | Freq: Every day | ORAL | Status: DC
  Administered 2016-01-10: 5 mg via ORAL
  Filled 2016-01-10: qty 1

## 2016-01-10 MED ORDER — LIDOCAINE 2% (20 MG/ML) 5 ML SYRINGE
INTRAMUSCULAR | Status: DC | PRN
  Administered 2016-01-10: 100 mg via INTRAVENOUS

## 2016-01-10 MED ORDER — METFORMIN HCL 500 MG PO TABS: 500.0000 mg | ORAL_TABLET | Freq: Two times a day (BID) | ORAL | Status: DC

## 2016-01-10 MED ORDER — BUPIVACAINE HCL (PF) 0.5 % IJ SOLN
INTRAMUSCULAR | Status: AC
  Filled 2016-01-10: qty 30

## 2016-01-10 MED ORDER — HYDROCODONE-ACETAMINOPHEN 5-325 MG PO TABS: 1.0000 | ORAL_TABLET | ORAL | 0 refills | Status: DC | PRN

## 2016-01-10 MED ORDER — CANAGLIFLOZIN 100 MG PO TABS: 100.0000 mg | ORAL_TABLET | Freq: Every day | ORAL | Status: DC

## 2016-01-10 MED ORDER — LACTATED RINGERS IV SOLN: INTRAVENOUS | Status: DC

## 2016-01-10 MED ORDER — HYDROCODONE-ACETAMINOPHEN 5-325 MG PO TABS
1.0000 | ORAL_TABLET | ORAL | Status: DC | PRN
  Administered 2016-01-10: 1 via ORAL
  Filled 2016-01-10: qty 1

## 2016-01-10 SURGICAL SUPPLY — 35 items
APL SKNCLS STERI-STRIP NONHPOA (GAUZE/BANDAGES/DRESSINGS)
APPLIER CLIP ROT 10 11.4 M/L (STAPLE) ×2
APR CLP MED LRG 11.4X10 (STAPLE) ×1
BAG SPEC RTRVL LRG 6X4 10 (ENDOMECHANICALS) ×1
BENZOIN TINCTURE PRP APPL 2/3 (GAUZE/BANDAGES/DRESSINGS) ×1 IMPLANT
CABLE HIGH FREQUENCY MONO STRZ (ELECTRODE) ×2 IMPLANT
CHLORAPREP W/TINT 26ML (MISCELLANEOUS) ×4 IMPLANT
CLIP APPLIE ROT 10 11.4 M/L (STAPLE) ×1 IMPLANT
COVER MAYO STAND STRL (DRAPES) ×2 IMPLANT
COVER SURGICAL LIGHT HANDLE (MISCELLANEOUS) ×2 IMPLANT
DECANTER SPIKE VIAL GLASS SM (MISCELLANEOUS) ×2 IMPLANT
DRAPE C-ARM 42X120 X-RAY (DRAPES) ×2 IMPLANT
ELECT REM PT RETURN 9FT ADLT (ELECTROSURGICAL) ×2
ELECTRODE REM PT RTRN 9FT ADLT (ELECTROSURGICAL) ×1 IMPLANT
GAUZE SPONGE 2X2 8PLY STRL LF (GAUZE/BANDAGES/DRESSINGS) ×1 IMPLANT
GLOVE SURG ORTHO 8.0 STRL STRW (GLOVE) ×2 IMPLANT
GOWN STRL REUS W/TWL XL LVL3 (GOWN DISPOSABLE) ×4 IMPLANT
HEMOSTAT SURGICEL 4X8 (HEMOSTASIS) IMPLANT
KIT BASIN OR (CUSTOM PROCEDURE TRAY) ×2 IMPLANT
POUCH SPECIMEN RETRIEVAL 10MM (ENDOMECHANICALS) ×2 IMPLANT
SCISSORS LAP 5X35 DISP (ENDOMECHANICALS) ×2 IMPLANT
SET CHOLANGIOGRAPH MIX (MISCELLANEOUS) ×2 IMPLANT
SET IRRIG TUBING LAPAROSCOPIC (IRRIGATION / IRRIGATOR) ×1 IMPLANT
SLEEVE XCEL OPT CAN 5 100 (ENDOMECHANICALS) ×2 IMPLANT
SPONGE GAUZE 2X2 STER 10/PKG (GAUZE/BANDAGES/DRESSINGS) ×1
STRIP CLOSURE SKIN 1/2X4 (GAUZE/BANDAGES/DRESSINGS) ×2 IMPLANT
SUT MNCRL AB 4-0 PS2 18 (SUTURE) ×2 IMPLANT
TAPE CLOTH SURG 4X10 WHT LF (GAUZE/BANDAGES/DRESSINGS) ×1 IMPLANT
TOWEL OR 17X26 10 PK STRL BLUE (TOWEL DISPOSABLE) ×2 IMPLANT
TOWEL OR NON WOVEN STRL DISP B (DISPOSABLE) ×2 IMPLANT
TRAY LAPAROSCOPIC (CUSTOM PROCEDURE TRAY) ×2 IMPLANT
TROCAR BLADELESS OPT 5 100 (ENDOMECHANICALS) ×2 IMPLANT
TROCAR XCEL BLUNT TIP 100MML (ENDOMECHANICALS) ×2 IMPLANT
TROCAR XCEL NON-BLD 11X100MML (ENDOMECHANICALS) ×2 IMPLANT
TUBING INSUF HEATED (TUBING) IMPLANT

## 2016-01-10 NOTE — Progress Notes (Signed)
Dr. Gerrit FriendsGerkin called unit for update on pt. Report given to MD that pt voiding WNL, eaten without any nausea, ambulated in hall 5 laps and pain managed well with po med. See new orders in EPIC to dc home.

## 2016-01-10 NOTE — Progress Notes (Signed)
Assessment unchanged. Pt and wife verbalized understanding of dc instructions through teach back including follow up care and when to call the doctor. Scripts x 2 given as provided by MD. Discharged via wc to front entrance to meet wife and care to carry home. Accompanied by nurse.

## 2016-01-10 NOTE — Anesthesia Postprocedure Evaluation (Signed)
Anesthesia Post Note  Patient: Scott [name]  Procedure(s) Performed: Procedure(s) (LRB): LAPAROSCOPIC CHOLECYSTECTOMY (N/A)  Patient location during evaluation: PACU Anesthesia Type: General Level of consciousness: awake and alert, oriented and patient cooperative Pain management: pain level controlled Vital Signs Assessment: post-procedure vital signs reviewed and stable Respiratory status: spontaneous breathing, nonlabored ventilation, respiratory function stable and patient connected to nasal cannula oxygen Cardiovascular status: blood pressure returned to baseline and stable Postop Assessment: no signs of nausea or vomiting Anesthetic complications: no    Last Vitals:  Vitals:   01/10/16 1200 01/10/16 1300  BP: 114/63 119/63  Pulse: 70 71  Resp: 18 20  Temp: 36.5 C 36.6 C    Last Pain:  Vitals:   01/10/16 1300  TempSrc: Oral  PainSc:                  Odessia Asleson,E. Evany Schecter

## 2016-01-10 NOTE — Anesthesia Preprocedure Evaluation (Addendum)
Anesthesia Evaluation  Patient identified by MRN, date of birth, ID band Patient awake    Reviewed: Allergy & Precautions, NPO status , Patient's Chart, lab work & pertinent test results  History of Anesthesia Complications Negative for: history of anesthetic complications  Airway Mallampati: II  TM Distance: >3 FB Neck ROM: Full    Dental  (+) Dental Advisory Given   Pulmonary sleep apnea and Continuous Positive Airway Pressure Ventilation , COPD,  COPD inhaler, former smoker,    breath sounds clear to auscultation       Cardiovascular hypertension, Pt. on medications (-) angina Rhythm:Regular Rate:Normal   9/17 myoview: No evidence for ischemia/infarction by perfusion images. EF 52%, mild diffuse hypokinesis. Good exercise tolerance with nonspecific upsloping ST depression noted. Overall, low risk study.    Neuro/Psych Anxiety negative neurological ROS     GI/Hepatic Neg liver ROS, GERD  Controlled and Medicated,  Endo/Other  diabetes (glu 137), Oral Hypoglycemic AgentsMorbid obesity  Renal/GU negative Renal ROS     Musculoskeletal   Abdominal (+) + obese,   Peds  Hematology negative hematology ROS (+)   Anesthesia Other Findings   Reproductive/Obstetrics                            Anesthesia Physical Anesthesia Plan  ASA: III  Anesthesia Plan: General   Post-op Pain Management:    Induction: Intravenous  Airway Management Planned: Oral ETT  Additional Equipment:   Intra-op Plan:   Post-operative Plan: Extubation in OR  Informed Consent: I have reviewed the patients History and Physical, chart, labs and discussed the procedure including the risks, benefits and alternatives for the proposed anesthesia with the patient or authorized representative who has indicated his/her understanding and acceptance.   Dental advisory given  Plan Discussed with: CRNA and  Surgeon  Anesthesia Plan Comments: (Plan routine monitors, GETA)        Anesthesia Quick Evaluation

## 2016-01-10 NOTE — Op Note (Signed)
Procedure Note  Pre-operative Diagnosis:  Chronic cholecystitis, cholelithiasis  Post-operative Diagnosis:  same  Surgeon:  Velora Hecklerodd M. Viona Hosking, MD, FACS  Assistant:  none   Procedure:  Laparoscopic cholecystectomy  Anesthesia:  General  Estimated Blood Loss:  minimal  Drains: none         Specimen: Gallbladder to pathology  Indications:  The patient is a 60 year old male who presents for evaluation of gall stones.  Patient returns to my practice to discuss cholecystectomy. Patient was originally evaluated in March 2016. At that time he had only had one episode of true biliary colic and wanted to hold off on surgical intervention. Over the past 18 months the patient has had intermittent symptoms with chest pain, right-sided abdominal pain, and back pain. Some of this may have been related to cholelithiasis. Patient did have a CT scan in January 2017 which showed severe diffuse hepatic steatosis. There were layering calcified gallstones measuring up to 9 mm in a nondistended gallbladder. Bile ducts appeared normal.   Procedure Details:  The patient was seen in the pre-op holding area. The risks, benefits, complications, treatment options, and expected outcomes were previously discussed with the patient. The patient agreed with the proposed plan and has signed the informed consent form.  The patient was brought to the Operating Room, identified as Scott [name] and the procedure verified as laparoscopic cholecystectomy with intraoperative cholangiography. A "time out" was completed and the above information confirmed.  The patient was placed in the supine position. Following induction of general anesthesia, the abdomen was prepped and draped in the usual aseptic fashion.  An incision was made in the skin near the umbilicus. The midline fascia was incised and the peritoneal cavity was entered and a Hasson canula was introduced under direct vision.  The Hasson canula was secured with a  0-Vicryl pursestring suture. Pneumoperitoneum was established with carbon dioxide. Additional trocars were introduced under direct vision along the right costal margin in the midline, mid-clavicular line, and anterior axillary line.   The gallbladder was identified and the fundus grasped and retracted cephalad. Adhesions were taken down bluntly and the electrocautery was utilized as needed, taking care not to injure any adjacent structures. The infundibulum was grasped and retracted laterally, exposing the peritoneum overlying the triangle of Calot. The peritoneum was incised and structures exposed with blunt dissection. The cystic duct was clearly identified, bluntly dissected circumferentially, and then ligated with surgical clips and divided. The cystic artery was identified, dissected circumferentially, ligated with ligaclips, and divided.  The gallbladder was dissected away from the liver bed using the electrocautery for hemostasis. The gallbladder was completely removed from the liver and placed into an endocatch bag. The gallbladder was removed in the endocatch bag through the umbilical port site and submitted to pathology for review.  The right upper quadrant was irrigated and the gallbladder bed was inspected. Hemostasis was achieved with the electrocautery.  Pneumoperitoneum was released after viewing removal of the trocars with good hemostasis noted. The umbilical wound was irrigated and the fascia was then closed with the pursestring suture.  Local anesthetic was infiltrated at all port sites. The skin incisions were closed with 4-0 Monocril subcuticular sutures and steri-strips and dressings were applied.  Instrument, sponge, and needle counts were correct at the conclusion of the case.  The patient was awakened from anesthesia and brought to the recovery room in stable condition.  The patient tolerated the procedure well.   Velora Hecklerodd M. Quante Pettry, MD, [facility] Office:  [phone]

## 2016-01-10 NOTE — Anesthesia Procedure Notes (Signed)
Procedure Name: Intubation Date/Time: 01/10/2016 7:25 AM Performed by: Epimenio SarinJARVELA, Sirius Woodford R Pre-anesthesia Checklist: Patient identified, Emergency Drugs available, Suction available, Patient being monitored and Timeout performed Patient Re-evaluated:Patient Re-evaluated prior to inductionOxygen Delivery Method: Circle system utilized Preoxygenation: Pre-oxygenation with 100% oxygen Intubation Type: IV induction and Rapid sequence Ventilation: Mask ventilation with difficulty Laryngoscope Size: Mac and 3 Grade View: Grade II Tube type: Oral Tube size: 7.5 mm Number of attempts: 1 Airway Equipment and Method: Stylet Placement Confirmation: ETT inserted through vocal cords under direct vision,  positive ETCO2 and breath sounds checked- equal and bilateral Secured at: 23 cm Tube secured with: Tape Dental Injury: Teeth and Oropharynx as per pre-operative assessment

## 2016-01-10 NOTE — Transfer of Care (Signed)
Immediate Anesthesia Transfer of Care Note  Patient: Scott [name]  Procedure(s) Performed: Procedure(s): LAPAROSCOPIC CHOLECYSTECTOMY (N/A)  Patient Location: PACU  Anesthesia Type:General  Level of Consciousness:  sedated, patient cooperative and responds to stimulation  Airway & Oxygen Therapy:Patient Spontanous Breathing and Patient connected to face mask oxgen  Post-op Assessment:  Report given to PACU RN and Post -op Vital signs reviewed and stable  Post vital signs:  Reviewed and stable  Last Vitals:  Vitals:   01/10/16 0550  BP: 127/76  Pulse: 67  Resp: 18  Temp: 36.4 C    Complications: No apparent anesthesia complications

## 2016-01-10 NOTE — Discharge Summary (Signed)
Physician Discharge Summary [facility]  Patient ID: Scott [name] MRN: [id number] DOB/AGE: 1956-02-22 60 y.o.  Admit date: 01/10/2016 Discharge date: 01/10/2016  Admission Diagnoses:  Chronic cholecystitis, cholelithiasis   Discharge Diagnoses:  Principal Problem:   Cholelithiasis with chronic cholecystitis   Discharged Condition: good  Hospital Course: Patient was admitted for observation following gallbladder surgery.  Post op course was uncomplicated.  Pain was well controlled.  Tolerated diet. Patient ambulated and voided. Patient was prepared for discharge home on POD#0.  Consults: None  Treatments: surgery: lap cholecystectomy  Discharge Exam: Blood pressure 119/63, pulse 71, temperature 97.8 F (36.6 C), temperature source Oral, resp. rate 20, height 5\' 11"  (1.803 m), weight 108.9 kg (240 lb), SpO2 98 %. See progress notes.  Disposition: Home  Discharge Instructions    Diet - low sodium heart healthy    Complete by:  As directed    Discharge instructions    Complete by:  As directed    [facility]  LAPAROSCOPIC SURGERY:  POST-OP INSTRUCTIONS  Always review your discharge instruction sheet given to you by the facility where your surgery was performed.  A prescription for pain medication may be given to you upon discharge.  Take your pain medication as prescribed.  If narcotic pain medicine is not needed, then you may take acetaminophen (Tylenol) or ibuprofen (Advil) as needed.  Take your usually prescribed medications unless otherwise directed.  If you need a refill on your pain medication, please contact your pharmacy.  They will contact our office to request authorization. Prescriptions will not be filled after 5 P.M. or on weekends.  You should follow a light diet the first few days after arrival home, such as soup and crackers or toast.  Be sure to include plenty of fluids daily.  Most patients will experience some  swelling and bruising in the area of the incisions.  Ice packs will help.  Swelling and bruising can take several days to resolve.   It is common to experience some constipation after surgery.  Increasing fluid intake and taking a stool softener (such as Colace) will usually help or prevent this problem from occurring.  A mild laxative (Milk of Magnesia or Miralax) should be taken according to package instructions if there has been no bowel movement after 48 hours.  You will have steri-strips and a gauze dressing over your incisions.  You may remove the gauze bandage on the second day after surgery, and you may shower at that time.  Leave your steri-strips (small skin tapes) in place directly over the incision.  These strips should remain on the skin for 5-7 days and then be removed.  You may get them wet in the shower and pat them dry.  Any sutures or staples will be removed at the office during your follow-up visit.  ACTIVITIES:  You may resume regular (light) daily activities beginning the next day - such as daily self-care, walking, climbing stairs - gradually increasing activities as tolerated.  You may have sexual intercourse when it is comfortable.  Refrain from any heavy lifting or straining until approved by your doctor.  You may drive when you are no longer taking prescription pain medication, you can comfortably wear a seatbelt, and you can safely maneuver your car and apply brakes.  You should see your doctor in the office for a follow-up appointment approximately 2-3 weeks after your surgery.  Make sure that you call for this appointment within a day or two  after you arrive home to insure a convenient appointment time.  WHEN TO CALL YOUR DOCTOR: Fever over 101.0 Inability to urinate Continued bleeding from incision Increased pain, redness, or drainage from the incision Increasing abdominal pain  The clinic staff is available to answer your questions during regular business hours.   Please don't hesitate to call and ask to speak to one of the nurses for clinical concerns.  If you have a medical emergency, go to the nearest emergency room or call 911.  A [facility] is always on call for the hospital.  Velora Hecklerodd M. Shirel Mallis, MD, [facility] Office: [phone] Toll Free:  [phone] FAX [phone]  Website: [email or web address]   Increase activity slowly    Complete by:  As directed    Remove dressing in 24 hours    Complete by:  As directed        Medication List    TAKE these medications   albuterol 108 (90 Base) MCG/ACT inhaler Commonly known as:  PROVENTIL HFA;VENTOLIN HFA Inhale 2 puffs into the lungs every 6 (six) hours as needed for wheezing. What changed:  reasons to take this   aspirin 81 MG tablet Take 1 tablet (81 mg total) by mouth daily.   cycloSPORINE 0.05 % ophthalmic emulsion Commonly known as:  RESTASIS Place 1 drop into both eyes 2 (two) times daily.   empagliflozin 25 MG Tabs tablet Commonly known as:  JARDIANCE Take 25 mg by mouth daily.   fenofibrate 160 MG tablet Take 1 tablet (160 mg total) by mouth daily.   HYDROcodone-acetaminophen 5-325 MG tablet Commonly known as:  NORCO/VICODIN Take 1-2 tablets by mouth every 4 (four) hours as needed for moderate pain.   lisinopril 5 MG tablet Commonly known as:  PRINIVIL,ZESTRIL Take 1 tablet (5 mg total) by mouth daily.   metFORMIN 500 MG tablet Commonly known as:  GLUCOPHAGE TAKE 2 TABLETS (1,000 MG TOTAL) BY MOUTH 2 (TWO) TIMES DAILY.   traMADol 50 MG tablet Commonly known as:  ULTRAM Take 1 tablet (50 mg total) by mouth every 6 (six) hours as needed for moderate pain.   Vitamin D (Ergocalciferol) 50000 units Caps capsule Commonly known as:  DRISDOL TAKE ONE CAPSULE EVERY SEVEN DAYS      Follow-up Information    Aarushi Hemric M, MD. Schedule an appointment as soon as possible for a visit in 3 week(s).    Specialty:  General Surgery Contact information: [address] [place] [address] [phone]           Velora Hecklerodd M. Saxon Crosby, MD, [facility] Office: [phone]   Signed: Velora HecklerGERKIN,Davinder Haff M 01/10/2016, 1:59 PM

## 2016-01-10 NOTE — Interval H&P Note (Signed)
History and Physical Interval Note:  01/10/2016 6:56 AM  Scott [name]  has presented today for surgery, with the diagnosis of CHOLELITHIASIS, ABDOMINAL PAIN.  The various methods of treatment have been discussed with the patient and family. After consideration of risks, benefits and other options for treatment, the patient has consented to    Procedure(s): LAPAROSCOPIC CHOLECYSTECTOMY WITH INTRAOPERATIVE CHOLANGIOGRAM (N/A) as a surgical intervention .    The patient's history has been reviewed, patient examined, no change in status, stable for surgery.  I have reviewed the patient's chart and labs.  Questions were answered to the patient's satisfaction.    Velora Hecklerodd M. Keyron Pokorski, MD, [facility] Office: [phone]   [name] MJudie Petit

## 2016-01-17 ENCOUNTER — Other Ambulatory Visit (INDEPENDENT_AMBULATORY_CARE_PROVIDER_SITE_OTHER): Payer: BLUE CROSS/BLUE SHIELD

## 2016-01-17 DIAGNOSIS — Z9049 Acquired absence of other specified parts of digestive tract: Secondary | ICD-10-CM | POA: Diagnosis not present

## 2016-01-18 ENCOUNTER — Other Ambulatory Visit: Payer: Self-pay | Admitting: Pharmacist

## 2016-01-19 ENCOUNTER — Telehealth: Payer: Self-pay | Admitting: Pharmacist

## 2016-01-19 NOTE — Telephone Encounter (Signed)
Called to check on patient.  His BG has been great.  A1c was checked in the hospital and was down to 6.9% from 9.1%.

## 2016-02-14 ENCOUNTER — Encounter: Payer: Self-pay | Admitting: Family Medicine

## 2016-02-14 ENCOUNTER — Ambulatory Visit (INDEPENDENT_AMBULATORY_CARE_PROVIDER_SITE_OTHER): Payer: BLUE CROSS/BLUE SHIELD | Admitting: Family Medicine

## 2016-02-14 VITALS — BP 118/75 | HR 72 | Temp 97.1°F | Ht 71.0 in | Wt 237.0 lb

## 2016-02-14 DIAGNOSIS — E559 Vitamin D deficiency, unspecified: Secondary | ICD-10-CM

## 2016-02-14 DIAGNOSIS — E7800 Pure hypercholesterolemia, unspecified: Secondary | ICD-10-CM

## 2016-02-14 DIAGNOSIS — Z9049 Acquired absence of other specified parts of digestive tract: Secondary | ICD-10-CM

## 2016-02-14 DIAGNOSIS — I10 Essential (primary) hypertension: Secondary | ICD-10-CM

## 2016-02-14 DIAGNOSIS — F5101 Primary insomnia: Secondary | ICD-10-CM | POA: Diagnosis not present

## 2016-02-14 DIAGNOSIS — K219 Gastro-esophageal reflux disease without esophagitis: Secondary | ICD-10-CM | POA: Diagnosis not present

## 2016-02-14 DIAGNOSIS — E1165 Type 2 diabetes mellitus with hyperglycemia: Secondary | ICD-10-CM | POA: Diagnosis not present

## 2016-02-14 DIAGNOSIS — Z23 Encounter for immunization: Secondary | ICD-10-CM

## 2016-02-14 MED ORDER — SUVOREXANT 15 MG PO TABS: 1.0000 | ORAL_TABLET | Freq: Every evening | ORAL | 0 refills | Status: DC | PRN

## 2016-02-14 MED ORDER — SUVOREXANT 10 MG PO TABS: 1.0000 | ORAL_TABLET | Freq: Every evening | ORAL | 0 refills | Status: DC | PRN

## 2016-02-14 MED ORDER — SUVOREXANT 20 MG PO TABS: 1.0000 | ORAL_TABLET | Freq: Every evening | ORAL | 0 refills | Status: DC | PRN

## 2016-02-14 NOTE — Patient Instructions (Addendum)
Continue current medications. Continue good therapeutic lifestyle changes which include good diet and exercise. Fall precautions discussed with patient. If an FOBT was given today- please return it to our front desk. If you are over 60 years old - you may need Prevnar 13 or the adult Pneumonia vaccine.  **Flu shots are available--- please call and schedule a FLU-CLINIC appointment**  After your visit with us today you will receive a survey in the mail or online from [employer] regarding your care with us. Please take a moment to fill this out. Your feedback is very important to us as you can help us better understand your patient needs as well as improve your experience and satisfaction. WE CARE ABOUT YOU!!!   The patient should continue to work aggressively on his therapeutic lifestyle changes which include diet and exercise. We will give him a prescription to try call Belsomra her to see if this will help with his insomnia.  The flu shot that he receives today may make his arm sore.

## 2016-02-14 NOTE — Progress Notes (Signed)
Subjective:    Patient ID: Scott [name], male    DOB: Aug 24, 1955, 60 y.o.   MRN: [id number]  HPI Pt here for follow up and management of chronic medical problems which includes diabetes, hyperlipidemia and hypertension. He is taking medications regularly.The patient had a recent cholecystectomy. He is doing well following the surgery. This was laparoscopic surgery. The patient is doing well overall. He complains of insomnia. He is recovered from the surgery well. He is due to get lab work today but had a recent A1c that was 6.9% and this was the end of October. He will also get his flu shot today. The patient is motivated to do better with his diet and to lose more weight. He is very positive today. When he was in the hospital he has some chest pain he had a cardiac evaluation which was good at that time. The patient denies any chest pain pressure or tightness. He denies any trouble with shortness of breath. He denies any problems with swallowing heartburn indigestion nausea vomiting diarrhea or blood in the stool. There is no family history of colon cancer. He is due a colonoscopy in 2019. He says he's passing his water well with no burning pain frequency or blood in the urine.    Patient Active Problem List   Diagnosis Date Noted  . Cholelithiasis with chronic cholecystitis 01/09/2016  . Chest pain 11/29/2015  . Vitamin D deficiency 10/06/2015  . Hyperlipidemia 02/12/2013  . Tobacco use disorder 01/05/2013  . Back pain, acute 12/13/2012  . Elevated blood pressure 12/04/2012  . H/O asbestosis 12/04/2012  . Erectile dysfunction 12/04/2012  . Neuropathy of both upper extremities 12/04/2012  . Degenerative arthritis of cervical spine 12/04/2012  . BPH (benign prostatic hyperplasia) 12/04/2012  . GERD (gastroesophageal reflux disease) 06/03/2012  . Diabetes mellitus type 2, controlled (HCC) 06/03/2012  . Generalized anxiety disorder 06/03/2012   Outpatient Encounter Prescriptions as of  02/14/2016  Medication Sig  . albuterol (PROVENTIL HFA;VENTOLIN HFA) 108 (90 BASE) MCG/ACT inhaler Inhale 2 puffs into the lungs every 6 (six) hours as needed for wheezing. (Patient taking differently: Inhale 2 puffs into the lungs every 6 (six) hours as needed for wheezing or shortness of breath. )  . aspirin 81 MG tablet Take 1 tablet (81 mg total) by mouth daily.  . cycloSPORINE (RESTASIS) 0.05 % ophthalmic emulsion Place 1 drop into both eyes 2 (two) times daily.   . fenofibrate 160 MG tablet Take 1 tablet (160 mg total) by mouth daily.  Marland Kitchen JARDIANCE 25 MG TABS tablet TAKE 1 TABLET BY MOUTH EVERY DAY  . lisinopril (PRINIVIL,ZESTRIL) 5 MG tablet Take 1 tablet (5 mg total) by mouth daily.  . metFORMIN (GLUCOPHAGE) 500 MG tablet TAKE 2 TABLETS (1,000 MG TOTAL) BY MOUTH 2 (TWO) TIMES DAILY.  Marland Kitchen Vitamin D, Ergocalciferol, (DRISDOL) 50000 units CAPS capsule TAKE ONE CAPSULE EVERY SEVEN DAYS  . [DISCONTINUED] HYDROcodone-acetaminophen (NORCO/VICODIN) 5-325 MG tablet Take 1-2 tablets by mouth every 4 (four) hours as needed for moderate pain.  . [DISCONTINUED] traMADol (ULTRAM) 50 MG tablet Take 1 tablet (50 mg total) by mouth every 6 (six) hours as needed for moderate pain.   No facility-administered encounter medications on file as of 02/14/2016.       Review of Systems  Constitutional: Negative.        Not sleeping well   HENT: Negative.   Eyes: Negative.   Respiratory: Negative.   Cardiovascular: Negative.   Gastrointestinal: Negative.   Endocrine: Negative.  Genitourinary: Negative.   Musculoskeletal: Negative.   Skin: Negative.   Allergic/Immunologic: Negative.   Neurological: Negative.   Hematological: Negative.   Psychiatric/Behavioral: Negative.        Objective:   Physical Exam  Constitutional: He is oriented to person, place, and time. He appears well-developed and well-nourished. No distress.  The patient has a positive demeanor and is alert.  HENT:  Head: Normocephalic  and atraumatic.  Right Ear: External ear normal.  Left Ear: External ear normal.  Nose: Nose normal.  Mouth/Throat: Oropharynx is clear and moist. No oropharyngeal exudate.  Eyes: Conjunctivae and EOM are normal. Pupils are equal, round, and reactive to light. Right eye exhibits no discharge. Left eye exhibits no discharge. No scleral icterus.  Neck: Normal range of motion. Neck supple. No thyromegaly present.  No bruits thyromegaly or anterior cervical adenopathy  Cardiovascular: Normal rate, regular rhythm, normal heart sounds and intact distal pulses.   No murmur heard. The heart has a regular rate and rhythm at 72/m  Pulmonary/Chest: Effort normal and breath sounds normal. No respiratory distress. He has no wheezes. He has no rales. He exhibits no tenderness.  Clear anteriorly and posteriorly and no axillary adenopathy  Abdominal: Soft. Bowel sounds are normal. He exhibits no mass. There is no tenderness. There is no rebound and no guarding.  Abdominal obesity with a BMI of 33.5. No masses tenderness or organ enlargement or bruits or inguinal adenopathy  Musculoskeletal: Normal range of motion. He exhibits no edema.  Limited range of motion of both shoulders secondary to surgery  Lymphadenopathy:    He has no cervical adenopathy.  Neurological: He is alert and oriented to person, place, and time. He has normal reflexes. No cranial nerve deficit.  Skin: Skin is warm and dry. No rash noted.  Psychiatric: He has a normal mood and affect. His behavior is normal. Judgment and thought content normal.  Nursing note and vitals reviewed.  BP 118/75 (BP Location: Left Arm)   Pulse 72   Temp 97.1 F (36.2 C) (Oral)   Ht 5\' 11"  (1.803 m)   Wt 237 lb (107.5 kg)   BMI 33.05 kg/m         Assessment & Plan:  1. Uncontrolled type 2 diabetes mellitus with hyperglycemia, without long-term current use of insulin (HCC) -The last hemoglobin A1c was much improved and it appears that his blood  sugar is getting better with his current regimen.  2. Pure hypercholesterolemia -Continue aggressive therapeutic lifestyle changes and current treatment pending results of lab work - Lipid panel  3. Gastroesophageal reflux disease, esophagitis presence not specified -Continue with weight loss efforts and dietary efforts and hopefully a lot of the symptoms will improve. - Hepatic function panel  4. Essential hypertension, benign -The blood pressure is good today and he will continue with current treatment and sodium restriction and weight loss  5. Vitamin D deficiency -Continue with current treatment pending results of lab work - VITAMIN D 25 Hydroxy (Vit-D Deficiency, Fractures)  6. Status post cholecystectomy -He has recovered well and is feeling well from his recent cholecystectomy  7. Primary insomnia -We will try the patient on Belsomra and start with 10 mg and go to 20 if necessary to cover his symptoms.  Meds ordered this encounter  Medications  . Suvorexant (BELSOMRA) 10 MG TABS    Sig: Take 1 tablet by mouth at bedtime as needed.    Dispense:  10 tablet    Refill:  0  .  Suvorexant (BELSOMRA) 15 MG TABS    Sig: Take 1 tablet by mouth at bedtime as needed.    Dispense:  10 tablet    Refill:  0  . Suvorexant (BELSOMRA) 20 MG TABS    Sig: Take 1 tablet by mouth at bedtime as needed.    Dispense:  10 tablet    Refill:  0   Patient Instructions  Continue current medications. Continue good therapeutic lifestyle changes which include good diet and exercise. Fall precautions discussed with patient. If an FOBT was given today- please return it to our front desk. If you are over 60 years old - you may need Prevnar 13 or the adult Pneumonia vaccine.  **Flu shots are available--- please call and schedule a FLU-CLINIC appointment**  After your visit with us today you will receive a survey in the mail or online from [employer] regarding your care with us. Please take a moment  to fill this out. Your feedback is very important to us as you can help us better understand your patient needs as well as improve your experience and satisfaction. WE CARE ABOUT YOU!!!   The patient should continue to work aggressively on his therapeutic lifestyle changes which include diet and exercise. We will give him a prescription to try call Belsomra her to see if this will help with his insomnia.  The flu shot that he receives today may make his arm sore.  Nyra Capeson W. Moore MD

## 2016-02-15 LAB — HEPATIC FUNCTION PANEL
ALBUMIN: 4.7 g/dL (ref 3.6–4.8)
ALK PHOS: 53 IU/L (ref 39–117)
ALT: 22 IU/L (ref 0–44)
AST: 17 IU/L (ref 0–40)
Bilirubin Total: 0.4 mg/dL (ref 0.0–1.2)
Bilirubin, Direct: 0.11 mg/dL (ref 0.00–0.40)
TOTAL PROTEIN: 7.3 g/dL (ref 6.0–8.5)

## 2016-02-15 LAB — VITAMIN D 25 HYDROXY (VIT D DEFICIENCY, FRACTURES): VIT D 25 HYDROXY: 40.4 ng/mL (ref 30.0–100.0)

## 2016-02-15 LAB — LIPID PANEL
CHOL/HDL RATIO: 3.8 ratio (ref 0.0–5.0)
Cholesterol, Total: 167 mg/dL (ref 100–199)
HDL: 44 mg/dL (ref >39)
LDL CALC: 100 mg/dL — AB (ref 0–99)
Triglycerides: 115 mg/dL (ref 0–149)
VLDL CHOLESTEROL CAL: 23 mg/dL (ref 5–40)

## 2016-02-22 DIAGNOSIS — H40033 Anatomical narrow angle, bilateral: Secondary | ICD-10-CM | POA: Diagnosis not present

## 2016-02-22 DIAGNOSIS — E119 Type 2 diabetes mellitus without complications: Secondary | ICD-10-CM | POA: Diagnosis not present

## 2016-02-22 LAB — HM DIABETES EYE EXAM

## 2016-02-29 ENCOUNTER — Other Ambulatory Visit: Payer: Self-pay | Admitting: Family Medicine

## 2016-03-13 ENCOUNTER — Ambulatory Visit: Payer: BLUE CROSS/BLUE SHIELD | Admitting: Family Medicine

## 2016-03-14 ENCOUNTER — Ambulatory Visit (INDEPENDENT_AMBULATORY_CARE_PROVIDER_SITE_OTHER): Payer: BLUE CROSS/BLUE SHIELD | Admitting: Family Medicine

## 2016-03-14 ENCOUNTER — Encounter: Payer: Self-pay | Admitting: Family Medicine

## 2016-03-14 VITALS — BP 118/72 | HR 74 | Temp 97.9°F | Ht 71.0 in | Wt 248.0 lb

## 2016-03-14 DIAGNOSIS — K1120 Sialoadenitis, unspecified: Secondary | ICD-10-CM

## 2016-03-14 MED ORDER — CEPHALEXIN 500 MG PO CAPS: 500.0000 mg | ORAL_CAPSULE | Freq: Four times a day (QID) | ORAL | 0 refills | Status: DC

## 2016-03-14 NOTE — Patient Instructions (Signed)
The patient says he doesn't tolerate Keflex even though he is allergic to penicillin. He should take the antibiotic cephalexin 500 mg 3 times a day for 10 days He should rinse his mouth with half peroxide and water 3 times daily after eating He should take Tylenol if needed for pain He should continue to monitor his blood sugars closely We will arrange for him to see an ear nose and throat specialist for follow up since his previous ENT surgeon has moved from the community.

## 2016-03-14 NOTE — Progress Notes (Signed)
Subjective:    Patient ID: Scott [name], male    DOB: Mar 29, 1955, 61 y.o.   MRN: [id number]  HPI  Patient here today for a knot under his tongue and right side jaw pain. This patient is having some jaw and neck pain on the right side near the scar tissue. It was somewhat swollen but that is better there is a marble-sized knot under the patient and he had had previous surgery by Dr. Emeline Darling who is now moved out of town. The previous surgery was  salivary gland removal. The patient says he was playing with his granddaughter several days ago and stretched his neck back in and the swelling in the neck occurred after that. The swelling under the tongue just occurred over the past 3-4 days. He is not been running any fever.   Patient Active Problem List   Diagnosis Date Noted  . Cholelithiasis with chronic cholecystitis 01/09/2016  . Chest pain 11/29/2015  . Vitamin D deficiency 10/06/2015  . Hyperlipidemia 02/12/2013  . Tobacco use disorder 01/05/2013  . Back pain, acute 12/13/2012  . Elevated blood pressure 12/04/2012  . H/O asbestosis 12/04/2012  . Erectile dysfunction 12/04/2012  . Neuropathy of both upper extremities 12/04/2012  . Degenerative arthritis of cervical spine 12/04/2012  . BPH (benign prostatic hyperplasia) 12/04/2012  . GERD (gastroesophageal reflux disease) 06/03/2012  . Diabetes mellitus type 2, controlled (HCC) 06/03/2012  . Generalized anxiety disorder 06/03/2012   Outpatient Encounter Prescriptions as of 03/14/2016  Medication Sig  . albuterol (PROVENTIL HFA;VENTOLIN HFA) 108 (90 BASE) MCG/ACT inhaler Inhale 2 puffs into the lungs every 6 (six) hours as needed for wheezing. (Patient taking differently: Inhale 2 puffs into the lungs every 6 (six) hours as needed for wheezing or shortness of breath. )  . aspirin 81 MG tablet Take 1 tablet (81 mg total) by mouth daily.  . cycloSPORINE (RESTASIS) 0.05 % ophthalmic emulsion Place 1 drop into both eyes 2 (two) times daily.    . fenofibrate 160 MG tablet Take 1 tablet (160 mg total) by mouth daily.  Marland Kitchen JARDIANCE 25 MG TABS tablet TAKE 1 TABLET BY MOUTH EVERY DAY  . lisinopril (PRINIVIL,ZESTRIL) 5 MG tablet Take 1 tablet (5 mg total) by mouth daily.  . metFORMIN (GLUCOPHAGE) 500 MG tablet TAKE 2 TABLETS (1,000 MG TOTAL) BY MOUTH 2 (TWO) TIMES DAILY.  . Suvorexant (BELSOMRA) 15 MG TABS Take 1 tablet by mouth at bedtime as needed.  . Vitamin D, Ergocalciferol, (DRISDOL) 50000 units CAPS capsule TAKE ONE CAPSULE EVERY SEVEN DAYS  . Suvorexant (BELSOMRA) 10 MG TABS Take 1 tablet by mouth at bedtime as needed. (Patient not taking: Reported on 03/14/2016)  . Suvorexant (BELSOMRA) 20 MG TABS Take 1 tablet by mouth at bedtime as needed. (Patient not taking: Reported on 03/14/2016)   No facility-administered encounter medications on file as of 03/14/2016.       Review of Systems  Constitutional: Negative.   HENT: Mouth sores: knot under tongue.        Right side jaw pain  Eyes: Negative.   Respiratory: Negative.   Cardiovascular: Negative.   Gastrointestinal: Negative.   Endocrine: Negative.   Genitourinary: Negative.   Musculoskeletal: Negative.   Skin: Negative.   Allergic/Immunologic: Negative.   Neurological: Negative.   Hematological: Negative.   Psychiatric/Behavioral: Negative.        Objective:   Physical Exam  Constitutional: He is oriented to person, place, and time. He appears well-developed and well-nourished. No distress.  HENT:  Head: Normocephalic and atraumatic.  Mouth/Throat: No oropharyngeal exudate.  There is slight swelling of the duct underneath the tip of the tongue on the right side compared to the left side. There is no swelling beneath the chin or anterior to the ear.  Eyes: Conjunctivae and EOM are normal. Pupils are equal, round, and reactive to light. Right eye exhibits no discharge. Left eye exhibits no discharge. No scleral icterus.  Neck: Normal range of motion. Neck supple. No  thyromegaly present.  Cardiovascular: Normal rate, regular rhythm and normal heart sounds.   No murmur heard. Pulmonary/Chest: Effort normal and breath sounds normal. No respiratory distress. He has no wheezes. He has no rales.  Abdominal: Soft.  Musculoskeletal: Normal range of motion. He exhibits no edema.  Lymphadenopathy:    He has no cervical adenopathy.  Neurological: He is alert and oriented to person, place, and time.  Skin: Skin is warm and dry. No rash noted.  Psychiatric: He has a normal mood and affect. His behavior is normal. Judgment and thought content normal.  Nursing note and vitals reviewed.   BP 118/72 (BP Location: Right Arm)   Pulse 74   Temp 97.9 F (36.6 C) (Oral)   Ht 5\' 11"  (1.803 m)   Wt 248 lb (112.5 kg)   BMI 34.59 kg/m        Assessment & Plan:  1. Salivary gland infection -Take Keflex 500 mg 3 times daily for 10 days(the patient says he does tolerate cephalexin and his reaction to penicillin was before taking the cephalexin.) -Appointment for follow-up with ear nose and throat -Rinse mouth as directed with half and half peroxide and water after eating at bedtime  Patient Instructions  The patient says he doesn't tolerate Keflex even though he is allergic to penicillin. He should take the antibiotic cephalexin 500 mg 3 times a day for 10 days He should rinse his mouth with half peroxide and water 3 times daily after eating He should take Tylenol if needed for pain He should continue to monitor his blood sugars closely We will arrange for him to see an ear nose and throat specialist for follow up since his previous ENT surgeon has moved from the community.  Nyra Capeson W. Alsie Younes MD

## 2016-03-15 LAB — CBC WITH DIFFERENTIAL/PLATELET
BASOS ABS: 0.1 10*3/uL (ref 0.0–0.2)
Basos: 1 %
EOS (ABSOLUTE): 0.3 10*3/uL (ref 0.0–0.4)
Eos: 3 %
Hematocrit: 44.4 % (ref 37.5–51.0)
Hemoglobin: 14.7 g/dL (ref 13.0–17.7)
Immature Grans (Abs): 0 10*3/uL (ref 0.0–0.1)
Immature Granulocytes: 0 %
LYMPHS ABS: 2.3 10*3/uL (ref 0.7–3.1)
Lymphs: 29 %
MCH: 29.6 pg (ref 26.6–33.0)
MCHC: 33.1 g/dL (ref 31.5–35.7)
MCV: 89 fL (ref 79–97)
Monocytes Absolute: 0.7 10*3/uL (ref 0.1–0.9)
Monocytes: 9 %
NEUTROS ABS: 4.6 10*3/uL (ref 1.4–7.0)
Neutrophils: 58 %
PLATELETS: 258 10*3/uL (ref 150–379)
RBC: 4.97 x10E6/uL (ref 4.14–5.80)
RDW: 14.2 % (ref 12.3–15.4)
WBC: 8 10*3/uL (ref 3.4–10.8)

## 2016-03-23 ENCOUNTER — Other Ambulatory Visit: Payer: Self-pay | Admitting: Family Medicine

## 2016-03-27 DIAGNOSIS — K1370 Unspecified lesions of oral mucosa: Secondary | ICD-10-CM | POA: Diagnosis not present

## 2016-03-27 DIAGNOSIS — K1120 Sialoadenitis, unspecified: Secondary | ICD-10-CM | POA: Diagnosis not present

## 2016-04-20 ENCOUNTER — Telehealth: Payer: Self-pay | Admitting: Family Medicine

## 2016-04-20 MED ORDER — SUVOREXANT 15 MG PO TABS: 1.0000 | ORAL_TABLET | Freq: Every evening | ORAL | 3 refills | Status: DC | PRN

## 2016-04-20 NOTE — Telephone Encounter (Signed)
Please review and advise.

## 2016-04-20 NOTE — Telephone Encounter (Signed)
Done pt aware °

## 2016-04-20 NOTE — Telephone Encounter (Signed)
Please talk to patient first and then call prescription in for Belsombra 15 mg to his drugstore. Please confirm with the patient that this is the strength that is working so we can document this in his computer record

## 2016-05-06 ENCOUNTER — Other Ambulatory Visit: Payer: Self-pay | Admitting: Family Medicine

## 2016-05-24 ENCOUNTER — Other Ambulatory Visit: Payer: Self-pay | Admitting: Family Medicine

## 2016-06-11 ENCOUNTER — Telehealth: Payer: Self-pay | Admitting: Family Medicine

## 2016-06-11 ENCOUNTER — Other Ambulatory Visit: Payer: Self-pay | Admitting: *Deleted

## 2016-06-11 MED ORDER — VITAMIN D (ERGOCALCIFEROL) 1.25 MG (50000 UNIT) PO CAPS: ORAL_CAPSULE | ORAL | 0 refills | Status: DC

## 2016-06-11 NOTE — Telephone Encounter (Signed)
What is the name of the medication? Vitamin D 40981 unites  Have you contacted your pharmacy to request a refill? Yes   Which pharmacy would you like this sent to? [facility] in [place]   Patient notified that their request is being sent to the clinical staff for review and that they should receive a call once it is complete. If they do not receive a call within 24 hours they can check with their pharmacy or our office.

## 2016-06-17 ENCOUNTER — Other Ambulatory Visit: Payer: Self-pay | Admitting: Family Medicine

## 2016-06-28 ENCOUNTER — Ambulatory Visit: Payer: BLUE CROSS/BLUE SHIELD | Admitting: Family Medicine

## 2016-07-12 ENCOUNTER — Encounter: Payer: Self-pay | Admitting: Family Medicine

## 2016-07-12 ENCOUNTER — Ambulatory Visit (INDEPENDENT_AMBULATORY_CARE_PROVIDER_SITE_OTHER): Payer: BLUE CROSS/BLUE SHIELD | Admitting: Family Medicine

## 2016-07-12 VITALS — BP 105/61 | HR 74 | Temp 97.5°F | Ht 71.0 in | Wt 238.0 lb

## 2016-07-12 DIAGNOSIS — Z0000 Encounter for general adult medical examination without abnormal findings: Secondary | ICD-10-CM

## 2016-07-12 DIAGNOSIS — E7800 Pure hypercholesterolemia, unspecified: Secondary | ICD-10-CM | POA: Diagnosis not present

## 2016-07-12 DIAGNOSIS — E1165 Type 2 diabetes mellitus with hyperglycemia: Secondary | ICD-10-CM

## 2016-07-12 DIAGNOSIS — Z87891 Personal history of nicotine dependence: Secondary | ICD-10-CM

## 2016-07-12 DIAGNOSIS — I10 Essential (primary) hypertension: Secondary | ICD-10-CM

## 2016-07-12 DIAGNOSIS — E559 Vitamin D deficiency, unspecified: Secondary | ICD-10-CM

## 2016-07-12 DIAGNOSIS — Z1159 Encounter for screening for other viral diseases: Secondary | ICD-10-CM

## 2016-07-12 DIAGNOSIS — J209 Acute bronchitis, unspecified: Secondary | ICD-10-CM

## 2016-07-12 DIAGNOSIS — J438 Other emphysema: Secondary | ICD-10-CM

## 2016-07-12 DIAGNOSIS — G4730 Sleep apnea, unspecified: Secondary | ICD-10-CM

## 2016-07-12 DIAGNOSIS — K219 Gastro-esophageal reflux disease without esophagitis: Secondary | ICD-10-CM

## 2016-07-12 LAB — URINALYSIS, COMPLETE
BILIRUBIN UA: NEGATIVE
KETONES UA: NEGATIVE
Leukocytes, UA: NEGATIVE
NITRITE UA: NEGATIVE
PROTEIN UA: NEGATIVE
RBC UA: NEGATIVE
UUROB: 0.2 mg/dL (ref 0.2–1.0)
pH, UA: 5 (ref 5.0–7.5)

## 2016-07-12 LAB — MICROSCOPIC EXAMINATION
Bacteria, UA: NONE SEEN
Epithelial Cells (non renal): NONE SEEN /hpf (ref 0–10)
RBC MICROSCOPIC, UA: NONE SEEN /HPF (ref 0–?)
Renal Epithel, UA: NONE SEEN /hpf
WBC UA: NONE SEEN /HPF (ref 0–?)

## 2016-07-12 LAB — BAYER DCA HB A1C WAIVED: HB A1C: 7.8 % — AB (ref <7.0)

## 2016-07-12 MED ORDER — EMPAGLIFLOZIN 25 MG PO TABS: 25.0000 mg | ORAL_TABLET | Freq: Every day | ORAL | 6 refills | Status: DC

## 2016-07-12 MED ORDER — FENOFIBRATE 160 MG PO TABS: 160.0000 mg | ORAL_TABLET | Freq: Every day | ORAL | 3 refills | Status: DC

## 2016-07-12 MED ORDER — ALBUTEROL SULFATE HFA 108 (90 BASE) MCG/ACT IN AERS: 2.0000 | INHALATION_SPRAY | Freq: Four times a day (QID) | RESPIRATORY_TRACT | 3 refills | Status: DC | PRN

## 2016-07-12 MED ORDER — VITAMIN D (ERGOCALCIFEROL) 1.25 MG (50000 UNIT) PO CAPS: ORAL_CAPSULE | ORAL | 3 refills | Status: DC

## 2016-07-12 MED ORDER — METFORMIN HCL 500 MG PO TABS: ORAL_TABLET | ORAL | 3 refills | Status: DC

## 2016-07-12 MED ORDER — LISINOPRIL 5 MG PO TABS: 5.0000 mg | ORAL_TABLET | Freq: Every day | ORAL | 3 refills | Status: DC

## 2016-07-12 NOTE — Progress Notes (Signed)
 Subjective:    Patient ID: Scott [name], male    DOB: 09/24/1955, 61 y.o.   MRN: [id number]  HPI Pt here for annual CPE and follow up and management of chronic medical problems which includes diabetes and hyperlipidemia. He is taking medication regularly.This patient is doing well overall and has no specific complaints. He is requesting refills for all of his medicines for 1 year. He will get lab work today a urine today and will be given an FOBT to return. His weight is down 10 pounds since the last visit we will call him for this. He needs to keep working on this. His BMI is 34.70. The patient is doing well. He says he is doing the best he is ever done especially since he stopped smoking and his had his gallbladder removed. He is not one to restart smoking and this is a good thing. He is very positive about his condition right now. When he went in for the gallbladder surgery he had a major workup for cardiovascular disease and this was negative. Were not sure when his next colonoscopy is due he has had 1 by Dr. may guide and this is not showing up in the record and we will call him and find out from Dr. Mae Gott when it is due next. The patient is also interested in making sure his pneumonia vaccines are up-to-date. He did have a Pneumovax in January 2011 and has never had the Prevnar. We will have him check with his insurance on this. He also is interested in getting the new shingles shot and we will have him check with insurance on this.     Patient Active Problem List   Diagnosis Date Noted  . Cholelithiasis with chronic cholecystitis 01/09/2016  . Chest pain 11/29/2015  . Vitamin D deficiency 10/06/2015  . Hyperlipidemia 02/12/2013  . Tobacco use disorder 01/05/2013  . Back pain, acute 12/13/2012  . Elevated blood pressure 12/04/2012  . H/O asbestosis 12/04/2012  . Erectile dysfunction 12/04/2012  . Neuropathy of both upper extremities 12/04/2012  . Degenerative arthritis of  cervical spine 12/04/2012  . BPH (benign prostatic hyperplasia) 12/04/2012  . GERD (gastroesophageal reflux disease) 06/03/2012  . Diabetes mellitus type 2, controlled (HCC) 06/03/2012  . Generalized anxiety disorder 06/03/2012   Outpatient Encounter Prescriptions as of 07/12/2016  Medication Sig  . albuterol (PROVENTIL HFA;VENTOLIN HFA) 108 (90 BASE) MCG/ACT inhaler Inhale 2 puffs into the lungs every 6 (six) hours as needed for wheezing. (Patient taking differently: Inhale 2 puffs into the lungs every 6 (six) hours as needed for wheezing or shortness of breath. )  . aspirin 81 MG tablet Take 1 tablet (81 mg total) by mouth daily.  . cycloSPORINE (RESTASIS) 0.05 % ophthalmic emulsion Place 1 drop into both eyes 2 (two) times daily.   . fenofibrate 160 MG tablet TAKE 1 TABLET (160 MG TOTAL) BY MOUTH DAILY.  . JARDIANCE 25 MG TABS tablet TAKE 1 TABLET BY MOUTH EVERY DAY  . lisinopril (PRINIVIL,ZESTRIL) 5 MG tablet TAKE 1 TABLET (5 MG TOTAL) BY MOUTH DAILY.  . metFORMIN (GLUCOPHAGE) 500 MG tablet TAKE 2 TABLETS (1,000 MG TOTAL) BY MOUTH 2 (TWO) TIMES DAILY.  . Suvorexant (BELSOMRA) 15 MG TABS Take 1 tablet by mouth at bedtime as needed.  . Vitamin D, Ergocalciferol, (DRISDOL) 50000 units CAPS capsule TAKE ONE CAPSULE EVERY SEVEN DAYS  . [DISCONTINUED] cephALEXin (KEFLEX) 500 MG capsule Take 1 capsule (500 mg total) by mouth 4 (four) times daily.     No facility-administered encounter medications on file as of 07/12/2016.      Review of Systems  Constitutional: Negative.   HENT: Negative.   Eyes: Negative.   Respiratory: Negative.   Cardiovascular: Negative.   Gastrointestinal: Negative.   Endocrine: Negative.   Genitourinary: Negative.   Musculoskeletal: Negative.   Skin: Negative.   Allergic/Immunologic: Negative.   Neurological: Negative.   Hematological: Negative.   Psychiatric/Behavioral: Negative.        Objective:   Physical Exam  Constitutional: He is oriented to person,  place, and time. He appears well-developed and well-nourished. No distress.  The patient is alert and has a positive demeanor  HENT:  Head: Normocephalic and atraumatic.  Right Ear: External ear normal.  Left Ear: External ear normal.  Mouth/Throat: Oropharynx is clear and moist. No oropharyngeal exudate.  Minimal nasal congestion  Eyes: Conjunctivae and EOM are normal. Pupils are equal, round, and reactive to light. Right eye exhibits no discharge. Left eye exhibits no discharge. No scleral icterus.  Neck: Normal range of motion. Neck supple. No thyromegaly present.  No bruits thyromegaly or anterior cervical adenopathy  Cardiovascular: Normal rate, regular rhythm and intact distal pulses.   No murmur heard. The heart is regular at 72/m  Pulmonary/Chest: Effort normal and breath sounds normal. No respiratory distress. He has no wheezes. He has no rales. He exhibits no tenderness.  Clear anteriorly and posteriorly and no axillary adenopathy  Abdominal: Soft. Bowel sounds are normal. He exhibits no mass. There is no tenderness. There is no rebound and no guarding.  No abdominal tenderness masses or bruits or organ enlargement  Genitourinary: Rectum normal and penis normal.  Genitourinary Comments: The prostate is enlarged but soft and smooth. There were no rectal masses palpated. The external genitalia were within normal limits. No inguinal hernias were palpated.  Musculoskeletal: He exhibits no edema or tenderness.  The patient has limited range of motion of both shoulders with abduction.  Lymphadenopathy:    He has no cervical adenopathy.  Neurological: He is alert and oriented to person, place, and time. He has normal reflexes. No cranial nerve deficit.  Skin: Skin is warm and dry. No rash noted.  Psychiatric: He has a normal mood and affect. His behavior is normal. Judgment and thought content normal.  Nursing note and vitals reviewed.   BP 105/61 (BP Location: Right Arm)   Pulse 74    Temp 97.5 F (36.4 C) (Oral)   Ht 5' 11" (1.803 m)   Wt 238 lb (108 kg)   BMI 33.19 kg/m        Assessment & Plan:  1. Annual physical exam -We will arrange for the patient to have a screening CT of the lungs because of his long history of smoking. He also has a history of COPD. - BMP8+EGFR - CBC with Differential/Platelet - Hepatic function panel - VITAMIN D 25 Hydroxy (Vit-D Deficiency, Fractures) - Lipid panel - PSA, total and free - Bayer DCA Hb A1c Waived - Urinalysis, Complete - Microalbumin / creatinine urine ratio  2. Uncontrolled type 2 diabetes mellitus with hyperglycemia, without long-term current use of insulin (HCC) -Continue current treatment pending results of lab work continue with lifestyle changes - CBC with Differential/Platelet - Bayer DCA Hb A1c Waived - Microalbumin / creatinine urine ratio  3. Pure hypercholesterolemia -Continue current treatment pending results of lab work along with aggressive therapeutic lifestyle changes - CBC with Differential/Platelet - Lipid panel  4. Gastroesophageal reflux disease, esophagitis presence not specified -  No complaints with this today. The patient will continue with weight loss efforts. - CBC with Differential/Platelet  5. Essential hypertension, benign -The blood pressure is good today and he will continue with current treatment - BMP8+EGFR - CBC with Differential/Platelet - Hepatic function panel  6. Vitamin D deficiency -Continue with current treatment pending results of lab work - CBC with Differential/Platelet - VITAMIN D 25 Hydroxy (Vit-D Deficiency, Fractures)  7. Encounter for hepatitis C screening test for low risk patient - Hepatitis C antibody  8. Sleep apnea, unspecified type - Ambulatory referral to Pulmonology  9. Other emphysema ([place]) -Screening CT of the chest because of history of cigarette smoking  No orders of the defined types were placed in this encounter.  Patient  Instructions  Continue current medications. Continue good therapeutic lifestyle changes which include good diet and exercise. Fall precautions discussed with patient. If an FOBT was given today- please return it to our front desk. If you are over 36 years old - you may need Prevnar 38 or the adult Pneumonia vaccine.  **Flu shots are available--- please call and schedule a FLU-CLINIC appointment**  After your visit with [place] today you will receive a survey in the mail or online from [employer] regarding your care with [place]. Please take a moment to fill this out. Your feedback is very important to [place] as you can help [place] better understand your patient needs as well as improve your experience and satisfaction. WE CARE ABOUT YOU!!!   Please check with your insurance regarding the Prevnar vaccine Please check with your insurance regarding the new shingles shot, shingrex This summer drink plenty of fluids and stay well hydrated We will check with Dr. May God regarding the time and need for your next colonoscopy Continue to monitor blood sugars regularly  Arrie Senate MD

## 2016-07-12 NOTE — Patient Instructions (Addendum)
Continue current medications. Continue good therapeutic lifestyle changes which include good diet and exercise. Fall precautions discussed with patient. If an FOBT was given today- please return it to our front desk. If you are over 61 years old - you may need Prevnar 13 or the adult Pneumonia vaccine.  **Flu shots are available--- please call and schedule a FLU-CLINIC appointment**  After your visit with us today you will receive a survey in the mail or online from [employer] regarding your care with us. Please take a moment to fill this out. Your feedback is very important to us as you can help us better understand your patient needs as well as improve your experience and satisfaction. WE CARE ABOUT YOU!!!   Please check with your insurance regarding the Prevnar vaccine Please check with your insurance regarding the new shingles shot, shingrex This summer drink plenty of fluids and stay well hydrated We will check with Dr. May God regarding the time and need for your next colonoscopy Continue to monitor blood sugars regularly

## 2016-07-13 LAB — CBC WITH DIFFERENTIAL/PLATELET
BASOS ABS: 0.1 10*3/uL (ref 0.0–0.2)
BASOS: 1 %
EOS (ABSOLUTE): 0.2 10*3/uL (ref 0.0–0.4)
Eos: 4 %
Hematocrit: 44.1 % (ref 37.5–51.0)
Hemoglobin: 15.1 g/dL (ref 13.0–17.7)
IMMATURE GRANS (ABS): 0 10*3/uL (ref 0.0–0.1)
IMMATURE GRANULOCYTES: 0 %
LYMPHS: 38 %
Lymphocytes Absolute: 2.4 10*3/uL (ref 0.7–3.1)
MCH: 30.6 pg (ref 26.6–33.0)
MCHC: 34.2 g/dL (ref 31.5–35.7)
MCV: 90 fL (ref 79–97)
Monocytes Absolute: 0.4 10*3/uL (ref 0.1–0.9)
Monocytes: 6 %
NEUTROS PCT: 51 %
Neutrophils Absolute: 3.3 10*3/uL (ref 1.4–7.0)
Platelets: 247 10*3/uL (ref 150–379)
RBC: 4.93 x10E6/uL (ref 4.14–5.80)
RDW: 13.6 % (ref 12.3–15.4)
WBC: 6.3 10*3/uL (ref 3.4–10.8)

## 2016-07-13 LAB — HEPATIC FUNCTION PANEL
ALK PHOS: 48 IU/L (ref 39–117)
ALT: 26 IU/L (ref 0–44)
AST: 20 IU/L (ref 0–40)
Albumin: 5 g/dL — ABNORMAL HIGH (ref 3.6–4.8)
Bilirubin Total: 0.5 mg/dL (ref 0.0–1.2)
Bilirubin, Direct: 0.13 mg/dL (ref 0.00–0.40)
TOTAL PROTEIN: 7.4 g/dL (ref 6.0–8.5)

## 2016-07-13 LAB — PSA, TOTAL AND FREE
PSA FREE PCT: 33.3 %
PSA FREE: 0.1 ng/mL
Prostate Specific Ag, Serum: 0.3 ng/mL (ref 0.0–4.0)

## 2016-07-13 LAB — BMP8+EGFR
BUN/Creatinine Ratio: 19 (ref 10–24)
BUN: 19 mg/dL (ref 8–27)
CALCIUM: 9.1 mg/dL (ref 8.6–10.2)
CO2: 19 mmol/L (ref 18–29)
Chloride: 98 mmol/L (ref 96–106)
Creatinine, Ser: 0.98 mg/dL (ref 0.76–1.27)
GFR calc Af Amer: 96 mL/min/{1.73_m2} (ref >59)
GFR, EST NON AFRICAN AMERICAN: 83 mL/min/{1.73_m2} (ref >59)
Glucose: 113 mg/dL — ABNORMAL HIGH (ref 65–99)
POTASSIUM: 4.7 mmol/L (ref 3.5–5.2)
Sodium: 133 mmol/L — ABNORMAL LOW (ref 134–144)

## 2016-07-13 LAB — MICROALBUMIN / CREATININE URINE RATIO: Creatinine, Urine: 10.8 mg/dL

## 2016-07-13 LAB — LIPID PANEL
CHOLESTEROL TOTAL: 173 mg/dL (ref 100–199)
Chol/HDL Ratio: 4.4 ratio (ref 0.0–5.0)
HDL: 39 mg/dL — ABNORMAL LOW (ref >39)
LDL Calculated: 97 mg/dL (ref 0–99)
Triglycerides: 184 mg/dL — ABNORMAL HIGH (ref 0–149)
VLDL CHOLESTEROL CAL: 37 mg/dL (ref 5–40)

## 2016-07-13 LAB — HEPATITIS C ANTIBODY: Hep C Virus Ab: <0.1 s/co ratio (ref 0.0–0.9)

## 2016-07-13 LAB — VITAMIN D 25 HYDROXY (VIT D DEFICIENCY, FRACTURES): VIT D 25 HYDROXY: 37 ng/mL (ref 30.0–100.0)

## 2016-07-16 ENCOUNTER — Telehealth: Payer: Self-pay | Admitting: Family Medicine

## 2016-07-16 NOTE — Telephone Encounter (Signed)
Patient aware of results and verbalizes understanding.  

## 2016-08-09 ENCOUNTER — Inpatient Hospital Stay: Admission: RE | Admit: 2016-08-09 | Payer: BLUE CROSS/BLUE SHIELD | Source: Ambulatory Visit

## 2016-08-09 ENCOUNTER — Other Ambulatory Visit: Payer: Self-pay | Admitting: Family Medicine

## 2016-08-09 ENCOUNTER — Ambulatory Visit
Admission: RE | Admit: 2016-08-09 | Discharge: 2016-08-09 | Disposition: A | Discharge status: Home / Self Care | Payer: No Typology Code available for payment source | Source: Ambulatory Visit | Attending: Family Medicine | Admitting: Family Medicine

## 2016-08-09 ENCOUNTER — Ambulatory Visit (INDEPENDENT_AMBULATORY_CARE_PROVIDER_SITE_OTHER): Payer: BLUE CROSS/BLUE SHIELD | Admitting: Pulmonary Disease

## 2016-08-09 ENCOUNTER — Encounter: Payer: Self-pay | Admitting: Pulmonary Disease

## 2016-08-09 DIAGNOSIS — G4733 Obstructive sleep apnea (adult) (pediatric): Secondary | ICD-10-CM

## 2016-08-09 DIAGNOSIS — Z87891 Personal history of nicotine dependence: Secondary | ICD-10-CM | POA: Diagnosis not present

## 2016-08-09 DIAGNOSIS — Z136 Encounter for screening for cardiovascular disorders: Secondary | ICD-10-CM | POA: Diagnosis not present

## 2016-08-09 NOTE — Patient Instructions (Signed)
   It was a pleasure taking care of you today!  You are diagnosed with Obstructive Sleep Apnea or OSA.   We will order you an autoCPAP  machine.  We will ask for a mask fitting session. We will ask for a 1 month download. Please call the office if you do NOT receive your machine in the next 1-2 weeks.   Please make sure you use your CPAP device everytime you sleep.  We will monitor the usage of your machine per your insurance requirement.  Your insurance company may take the machine from you if you are not using it regularly.   Please clean the mask, tubings, filter, water reservoir with soapy water every week.  Please use distilled water for the water reservoir.   Please call the office or your machine provider (DME company) if you are having issues with the device.   Return to clinic in 6-8 weeks with  NP.

## 2016-08-09 NOTE — Assessment & Plan Note (Signed)
Patient was diagnosed with sleep apnea at the turn of the century. He had hypersomnia, snoring, gasping, witnessed apneas, choking, unrefreshed sleep. He was very symptomatic. He had a lab study which showed he had severe sleep apnea. He was started on CPAP machine. He is significantly improved with CPAP machine. Feels better with it, more energy, less sleepiness.  He is currently on his third machine which is 61 years old. The last year or so, the machine has started to be malfunctioning oh. It does not deliver enough pressure, it stops occasionally. He is getting to be symptomatic again.  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 He gets sleepy, tired, snoring, has witnessed apneas, gasping, choking, despite using his 61 year old malfunctioning machine. Hypersomnia affects his functionality.  He is very compliant and loves his CPAP machine.  ESS 10  Plan :  We extensively discussed the diagnosis, pathophysiology, and treatment options for Obstructive Sleep Apnea (OSA).   We discussed treatment options for OSA including CPAP, BiPaP, as well as surgical options and oral devices.   We will start patient on auto CPAP, 5-15 centimeters water. If he is required to have a sleep study based on  insurance, he will most likely need a lab study. If the [facility], he would rather have it done in a different health system.   Patient was instructed to call the office if he/she has not received the PAP device in 1-2 weeks.  Patient was instructed to have mask, tubings, filter, reservoir cleaned at least once a week with soapy water.  Patient was instructed to call the office if he/she is having issues with the PAP device.    I advised patient to obtain sufficient amount of sleep --  7 to 8 hours at least in a 24 hr period.  Patient was advised to follow good sleep hygiene.  Patient was advised NOT to engage in activities requiring  concentration and/or vigilance if he/she is and  sleepy.  Patient is NOT to drive if he/she is sleepy.

## 2016-08-09 NOTE — Progress Notes (Signed)
Subjective:    Patient ID: Scott [name], male    DOB: 1955/08/11, 61 y.o.   MRN: [id number]  HPI   This is the case of Scott [name], 61 y.o. Male, who was referred by Dr. Rudi Heap  in consultation regarding OSA  As you very well know, patient has a 30 PY smoking history (quit in 2016), not been diagnosed with asthma or copd. He was exposed to asbestos from 61 yo until 61 yo working at [employer].   Patient was diagnosed with sleep apnea at the turn of the century. He had hypersomnia, snoring, gasping, witnessed apneas, choking, unrefreshed sleep. He was very symptomatic. He had a lab study which showed he had severe sleep apnea. He was started on CPAP machine. He is significantly improved with CPAP machine. Feels better with it, more energy, less sleepiness.  He is currently on his third machine which is 61 years old. The last year or so, the machine has started to be malfunctioning oh. It does not deliver enough pressure, it stops occasionally. He is getting to be symptomatic again.  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 He gets sleepy, tired, snoring, has witnessed apneas, gasping, choking, despite using his 61 year old malfunctioning machine. Hypersomnia affects his functionality.  He is very compliant and loves his CPAP machine.  ESS 10. He denies abnormal behavior and sleep.   Review of Systems  Constitutional: Negative.  Negative for fever and unexpected weight change.  HENT: Positive for dental problem. Negative for congestion, ear pain, nosebleeds, postnasal drip, rhinorrhea, sinus pressure, sneezing, sore throat and trouble swallowing.   Eyes: Positive for redness and itching.  Respiratory: Positive for shortness of breath. Negative for cough, chest tightness and wheezing.   Cardiovascular: Negative.  Negative for palpitations and leg swelling.  Gastrointestinal: Negative.  Negative for nausea and vomiting.  Endocrine:  Negative.   Genitourinary: Negative.  Negative for dysuria.  Musculoskeletal: Negative.  Negative for joint swelling.  Skin: Negative.  Negative for rash.  Allergic/Immunologic: Positive for environmental allergies. Negative for food allergies and immunocompromised state.  Neurological: Negative.  Negative for headaches.  Hematological: Bruises/bleeds easily.  Psychiatric/Behavioral: Negative.  Negative for dysphoric mood. The patient is not nervous/anxious.    Past Medical History:  Diagnosis Date  . Anxiety   . Arthritis   . Asbestosis (HCC)   . Diabetes mellitus without complication (HCC)   . Dyslipidemia   . GERD (gastroesophageal reflux disease)   . Numbness    right side of tongue since 2016 surgery  . Sleep apnea    uses cpap  . Tobacco abuse    (-) CA or DVT  Family History  Problem Relation Age of Onset  . Stroke Father        Died age 5  . CAD Father   . CAD Brother 43       CABG     Past Surgical History:  Procedure Laterality Date  . CHOLECYSTECTOMY N/A 01/10/2016   Procedure: LAPAROSCOPIC CHOLECYSTECTOMY;  Surgeon: Darnell Level, MD;  Location: [facility]S;  Service: General;  Laterality: N/A;  . FOOT MASS EXCISION Right 35 yrs ago  . right knuckle replaced  11/30/2014  . saliva gland removed  04/2014   dr Clovis Riley gore  . VASECTOMY      Social History   Social History  . Marital status: Married    Spouse name: N/A  . Number of children: 2  . Years  of education: N/A   Occupational History  . Plumber Self Employed   Social History Main Topics  . Smoking status: Former Smoker    Packs/day: 0.00    Types: Cigarettes    Quit date: 01/17/2015  . Smokeless tobacco: Never Used     Comment: He has patches.  . Alcohol use Yes     Comment: social  . Drug use: No  . Sexual activity: Not on file   Other Topics Concern  . Not on file   Social History Narrative   Lives at home with wife.    [place]   Allergies  Allergen  Reactions  . Augmentin [Amoxicillin-Pot Clavulanate] Itching and Nausea And Vomiting  . Codeine Itching and Nausea And Vomiting  . Penicillins Hives and Itching    Has patient had a PCN reaction causing immediate rash, facial/tongue/throat swelling, SOB or lightheadedness with hypotension: No Has patient had a PCN reaction causing severe rash involving mucus membranes or skin necrosis: No Has patient had a PCN reaction that required hospitalization No Has patient had a PCN reaction occurring within the last 10 years: No If all of the above answers are "NO", then may proceed with Cephalosporin use.   . Atorvastatin     Myalgias and fatigue  . Oxycontin [Oxycodone Hcl] Hives and Itching     Outpatient Medications Prior to Visit  Medication Sig Dispense Refill  . albuterol (PROVENTIL HFA;VENTOLIN HFA) 108 (90 Base) MCG/ACT inhaler Inhale 2 puffs into the lungs every 6 (six) hours as needed for wheezing. 1 Inhaler 3  . aspirin 81 MG tablet Take 1 tablet (81 mg total) by mouth daily. 30 tablet   . cycloSPORINE (RESTASIS) 0.05 % ophthalmic emulsion Place 1 drop into both eyes 2 (two) times daily.     . empagliflozin (JARDIANCE) 25 MG TABS tablet Take 25 mg by mouth daily. 30 tablet 6  . fenofibrate 160 MG tablet Take 1 tablet (160 mg total) by mouth daily. 90 tablet 3  . lisinopril (PRINIVIL,ZESTRIL) 5 MG tablet Take 1 tablet (5 mg total) by mouth daily. 90 tablet 3  . metFORMIN (GLUCOPHAGE) 500 MG tablet TAKE 2 TABLETS (1,000 MG TOTAL) BY MOUTH 2 (TWO) TIMES DAILY. 360 tablet 3  . Suvorexant (BELSOMRA) 15 MG TABS Take 1 tablet by mouth at bedtime as needed. 30 tablet 3  . Vitamin D, Ergocalciferol, (DRISDOL) 50000 units CAPS capsule TAKE ONE CAPSULE EVERY SEVEN DAYS 12 capsule 3   No facility-administered medications prior to visit.    No orders of the defined types were placed in this encounter.        Objective:   Physical Exam  Vitals:  Vitals:   08/09/16 1527  BP: 118/64    Pulse: 79  SpO2: 96%  Weight: 239 lb 9.6 oz (108.7 kg)  Height: 5\' 11"  (1.803 m)    Constitutional/General:  Pleasant, well-nourished, well-developed, not in any distress,  Comfortably seating.  Well kempt  Body mass index is 33.42 kg/m. Wt Readings from Last 3 Encounters:  08/09/16 239 lb 9.6 oz (108.7 kg)  07/12/16 238 lb (108 kg)  03/14/16 248 lb (112.5 kg)    Neck circumference:   HEENT: Pupils equal and reactive to light and accommodation. Anicteric sclerae. Normal nasal mucosa.   No oral  lesions,  mouth clear,  oropharynx clear, no postnasal drip. (-) Oral thrush. No dental caries.  Airway - Mallampati class III  Neck: No masses. Midline trachea. No  JVD, (-) LAD. (-) bruits appreciated.  Respiratory/Chest: Grossly normal chest. (-) deformity. (-) Accessory muscle use.  Symmetric expansion. (-) Tenderness on palpation.  Resonant on percussion.  Diminished BS on both lower lung zones. (-) wheezing, crackles, rhonchi (-) egophony  Cardiovascular: Regular rate and  rhythm, heart sounds normal, no murmur or gallops, no peripheral edema  Gastrointestinal:  Normal bowel sounds. Soft, non-tender. No hepatosplenomegaly.  (-) masses.   Musculoskeletal:  Normal muscle tone. Normal gait.   Extremities: Grossly normal. (-) clubbing, cyanosis.  (-) edema  Skin: (-) rash,lesions seen.   Neurological/Psychiatric : alert, oriented to time, place, person. Normal mood and affect          Assessment & Plan:  OSA (obstructive sleep apnea) Patient was diagnosed with sleep apnea at the turn of the century. He had hypersomnia, snoring, gasping, witnessed apneas, choking, unrefreshed sleep. He was very symptomatic. He had a lab study which showed he had severe sleep apnea. He was started on CPAP machine. He is significantly improved with CPAP machine. Feels better with it, more energy, less sleepiness.  He is currently on his third machine which is 61 years old. The last  year or so, the machine has started to be malfunctioning oh. It does not deliver enough pressure, it stops occasionally. He is getting to be symptomatic again.  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 He gets sleepy, tired, snoring, has witnessed apneas, gasping, choking, despite using his 61 year old malfunctioning machine. Hypersomnia affects his functionality.  He is very compliant and loves his CPAP machine.  ESS 10  Plan :  We extensively discussed the diagnosis, pathophysiology, and treatment options for Obstructive Sleep Apnea (OSA).   We discussed treatment options for OSA including CPAP, BiPaP, as well as surgical options and oral devices.   We will start patient on auto CPAP, 5-15 centimeters water. If he is required to have a sleep study based on  insurance, he will most likely need a lab study. If the lab study cannot be done sooner at [facility], he would rather have it done in a different health system.   Patient was instructed to call the office if he/she has not received the PAP device in 1-2 weeks.  Patient was instructed to have mask, tubings, filter, reservoir cleaned at least once a week with soapy water.  Patient was instructed to call the office if he/she is having issues with the PAP device.    I advised patient to obtain sufficient amount of sleep --  7 to 8 hours at least in a 24 hr period.  Patient was advised to follow good sleep hygiene.  Patient was advised NOT to engage in activities requiring concentration and/or vigilance if he/she is and  sleepy.  Patient is NOT to drive if he/she is sleepy.         Thank you very much for letting me participate in this patient's care. Please do not hesitate to give me a call if you have any questions or concerns regarding the treatment plan.   Patient will follow up with [place] in 8 weeks or so.     Pollie Meyer, MD 08/09/2016   4:10 PM Pulmonary and Critical Care  Medicine [facility] Pager: [phone] Office: [phone], Fax: [phone]

## 2016-08-09 NOTE — Addendum Note (Signed)
Addended by: Garfield CorneaMABRY, JASMINE L on: 08/09/2016 04:18 PM   Modules accepted: Orders

## 2016-08-10 ENCOUNTER — Other Ambulatory Visit: Payer: Self-pay | Admitting: *Deleted

## 2016-08-10 MED ORDER — SUVOREXANT 15 MG PO TABS: 1.0000 | ORAL_TABLET | Freq: Every evening | ORAL | 3 refills | Status: DC | PRN

## 2016-08-16 ENCOUNTER — Telehealth: Payer: Self-pay | Admitting: Pulmonary Disease

## 2016-08-16 DIAGNOSIS — G4733 Obstructive sleep apnea (adult) (pediatric): Secondary | ICD-10-CM | POA: Diagnosis not present

## 2016-08-16 NOTE — Telephone Encounter (Signed)
Order placed for pressure change to CPAP Pt aware. Nothing further needed.

## 2016-08-16 NOTE — Telephone Encounter (Signed)
Order DME increase CPAP to 7 cwp    Dx OSA

## 2016-08-16 NOTE — Telephone Encounter (Signed)
lmtcb x1 for pt. We need to speak to the pt about this matter.

## 2016-08-16 NOTE — Telephone Encounter (Signed)
Pt states that he saw Dr Christene Slatesde Dios [facility].  Pt picked up new machine today  States that pressure starting at 5cm is too little and needs this increased.  [facility] told the patient that he may need about 7cm starting out.  Please advise Dr Maple HudsonYoung if you are okay with increasing CPAP pressure. Thanks.

## 2016-08-16 NOTE — Telephone Encounter (Signed)
Pt returned phone call, pt contact # [phone].Marland [email or web address]

## 2016-08-22 DIAGNOSIS — H40033 Anatomical narrow angle, bilateral: Secondary | ICD-10-CM | POA: Diagnosis not present

## 2016-08-22 DIAGNOSIS — E119 Type 2 diabetes mellitus without complications: Secondary | ICD-10-CM | POA: Diagnosis not present

## 2016-08-22 LAB — HM DIABETES EYE EXAM

## 2016-09-13 ENCOUNTER — Ambulatory Visit (INDEPENDENT_AMBULATORY_CARE_PROVIDER_SITE_OTHER): Payer: BLUE CROSS/BLUE SHIELD | Admitting: Physician Assistant

## 2016-09-13 ENCOUNTER — Encounter: Payer: Self-pay | Admitting: Physician Assistant

## 2016-09-13 VITALS — BP 102/63 | HR 88 | Temp 97.4°F | Ht 71.0 in | Wt 236.0 lb

## 2016-09-13 DIAGNOSIS — J029 Acute pharyngitis, unspecified: Secondary | ICD-10-CM | POA: Diagnosis not present

## 2016-09-13 MED ORDER — PREDNISONE 10 MG (21) PO TBPK: ORAL_TABLET | ORAL | 0 refills | Status: DC

## 2016-09-13 MED ORDER — AZITHROMYCIN 250 MG PO TABS: ORAL_TABLET | ORAL | 1 refills | Status: DC

## 2016-09-13 NOTE — Progress Notes (Signed)
Subjective:     Patient ID: Scott [name], male   DOB: 03-14-1955, 61 y.o.   MRN: [id number]  HPI Pt with S/T and sinus congestion On 3rd day of Zpack and feeling better  Review of Systems  Constitutional: Positive for activity change, appetite change and fatigue. Negative for fever.  HENT: Positive for congestion, postnasal drip and sore throat. Negative for rhinorrhea, sinus pain, sinus pressure and sneezing.   Respiratory: Negative.   Cardiovascular: Negative.        Objective:   Physical Exam  Constitutional: He appears well-developed and well-nourished.  HENT:  Right Ear: External ear normal.  Left Ear: External ear normal.  Mouth/Throat: No oropharyngeal exudate.  + erythema but no exudate noted  Neck: Neck supple.  Cardiovascular: Normal rate, regular rhythm and normal heart sounds.   Pulmonary/Chest: Effort normal and breath sounds normal.  Lymphadenopathy:    He has no cervical adenopathy.  Nursing note and vitals reviewed.      Assessment:     1. Acute pharyngitis, unspecified etiology        Plan:     Fluids Rest Finish Zpack OTC meds for sx F/U prn He is getting ready to travel [place] country so Rx for Zpack and Sterapred pack done today

## 2016-09-13 NOTE — Patient Instructions (Signed)

## 2016-10-01 ENCOUNTER — Ambulatory Visit: Payer: BLUE CROSS/BLUE SHIELD | Admitting: Acute Care

## 2016-11-15 ENCOUNTER — Encounter: Payer: Self-pay | Admitting: Acute Care

## 2016-11-15 ENCOUNTER — Ambulatory Visit (INDEPENDENT_AMBULATORY_CARE_PROVIDER_SITE_OTHER): Payer: BLUE CROSS/BLUE SHIELD | Admitting: Acute Care

## 2016-11-15 VITALS — BP 124/80 | HR 78

## 2016-11-15 DIAGNOSIS — G4733 Obstructive sleep apnea (adult) (pediatric): Secondary | ICD-10-CM

## 2016-11-15 NOTE — Patient Instructions (Addendum)
It is good to see you today. We will send in a prescription to  increase pressure to Auto Set 8-15. Continue on CPAP at bedtime. You appear to be benefiting from the treatment Goal is to wear for at least 6 hours each night for maximal clinical benefit. Continue to work on weight loss, as the link between excess weight  and sleep apnea is well established.  Do not drive if sleepy. Remember to clean mask, tubing, filter, and reservoir once weekly with soapy water.  Follow up with Maralyn SagoSarah NP  In 3 months  or before as needed.  Please contact office for sooner follow up if symptoms do not improve or worsen or seek emergency care

## 2016-11-15 NOTE — Assessment & Plan Note (Signed)
Patient is benefiting from therapy Feels he needs a higher pressure as the machine is ramping up Plan We will send in a prescription to  increase pressure to Auto Set 8-15. Continue on CPAP at bedtime. You appear to be benefiting from the treatment Goal is to wear for at least 6 hours each night for maximal clinical benefit. Continue to work on weight loss, as the link between excess weight  and sleep apnea is well established.  Do not drive if sleepy. Remember to clean mask, tubing, filter, and reservoir once weekly with soapy water.  Follow up with Maralyn SagoSarah NP  In 3 months  or before as needed.  Please contact office for sooner follow up if symptoms do not improve or worsen or seek emergency care

## 2016-11-15 NOTE — Progress Notes (Signed)
History of Present Illness Scott [name] is a 61 y.o. male former smoker with OSA. He was followed by Dr Christene Slates.  30 PY smoking history (quit in 2016), not been diagnosed with asthma or copd. He was exposed to asbestos from 61 yo until 61 yo working at [employer].   Patient was diagnosed with sleep apnea at the turn of the century. He had hypersomnia, snoring, gasping, witnessed apneas, choking, unrefreshed sleep. He was very symptomatic. He had a lab study which showed he had severe sleep apnea. He was started on CPAP machine. He is significantly improved with CPAP machine. Feels better with it, more energy, less sleepiness.  He is currently on his third machine which is 60 years old. The last year or so, the machine has started to be malfunctioning oh. It does not deliver enough pressure, it stops occasionally. He is getting to be symptomatic again.  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 He gets sleepy, tired, snoring, has witnessed apneas, gasping, choking, despite using his 61 year old malfunctioning machine. Hypersomnia affects his functionality.  He is very compliant and loves his CPAP machine.   11/15/2016 8 week CPAP follow up: Pt. Presents for follow up. He states his original settings of Auto Set 7-15 are not adequate. He feels he is not getting enough air as the machine is ramping up. He is compliant with use 100% of the time. He is even using it for naps.He states he has no daytime sleepiness.He is better focused. He has no complaints. He travels with his machine.He denies fever, chest pain, orthopnea of hemoptysis.   Test Results: Auto set 7 cm H2O-15 cm H2O Download 10/16/2016-11/14/2016 Usage days 30 of 30 or 100% Greater than 4 hours 30 days or 100% Average usage hours 9 hours and 34 minutes Median pressure 7.8 cm H2O AHI 1.4    CBC Latest Ref Rng & Units 07/12/2016 03/14/2016 01/05/2016  WBC 3.4 - 10.8 x10E3/uL 6.3 8.0 7.2    Hemoglobin 13.0 - 17.7 g/dL 16.1 09.6 04.5  Hematocrit 37.5 - 51.0 % 44.1 44.4 44.9  Platelets 150 - 379 x10E3/uL 247 258 234    BMP Latest Ref Rng & Units 07/12/2016 01/05/2016 11/29/2015  Glucose 65 - 99 mg/dL 409(W) 119(J) 478(G)  BUN 8 - 27 mg/dL 19 19 95(A)  Creatinine 0.76 - 1.27 mg/dL 2.13 0.86 5.78  BUN/Creat Ratio 10 - 24 19 - -  Sodium 134 - 144 mmol/L 133(L) 137 137  Potassium 3.5 - 5.2 mmol/L 4.7 4.2 3.9  Chloride 96 - 106 mmol/L 98 107 108  CO2 18 - 29 mmol/L 19 22 21(L)  Calcium 8.6 - 10.2 mg/dL 9.1 9.6 8.9    Past medical hx Past Medical History:  Diagnosis Date  . Anxiety   . Arthritis   . Asbestosis (HCC)   . Diabetes mellitus without complication (HCC)   . Dyslipidemia   . GERD (gastroesophageal reflux disease)   . Numbness    right side of tongue since 2016 surgery  . Sleep apnea    uses cpap  . Tobacco abuse      Social History  Substance Use Topics  . Smoking status: Former Smoker    Packs/day: 0.00    Types: Cigarettes    Quit date: 01/17/2015  . Smokeless tobacco: Never Used     Comment: He has patches.  . Alcohol use Yes     Comment: social    Mr.Rosevear  reports that he quit smoking about 21 months ago. His smoking use included Cigarettes. He smoked 0.00 packs per day. He has never used smokeless tobacco. He reports that he drinks alcohol. He reports that he does not use drugs.  Tobacco Cessation: Former smoker quit 2016  Past surgical hx, Family hx, Social hx all reviewed.  Current Outpatient Prescriptions on File Prior to Visit  Medication Sig  . albuterol (PROVENTIL HFA;VENTOLIN HFA) 108 (90 Base) MCG/ACT inhaler Inhale 2 puffs into the lungs every 6 (six) hours as needed for wheezing.  Marland Kitchen. aspirin 81 MG tablet Take 1 tablet (81 mg total) by mouth daily.  . cycloSPORINE (RESTASIS) 0.05 % ophthalmic emulsion Place 1 drop into both eyes 2 (two) times daily.   . empagliflozin (JARDIANCE) 25 MG TABS tablet Take 25 mg by mouth daily.  .  fenofibrate 160 MG tablet Take 1 tablet (160 mg total) by mouth daily.  Marland Kitchen. lisinopril (PRINIVIL,ZESTRIL) 5 MG tablet Take 1 tablet (5 mg total) by mouth daily.  . metFORMIN (GLUCOPHAGE) 500 MG tablet TAKE 2 TABLETS (1,000 MG TOTAL) BY MOUTH 2 (TWO) TIMES DAILY.  . Suvorexant (BELSOMRA) 15 MG TABS Take 1 tablet by mouth at bedtime as needed.  . Vitamin D, Ergocalciferol, (DRISDOL) 50000 units CAPS capsule TAKE ONE CAPSULE EVERY SEVEN DAYS   No current facility-administered medications on file prior to visit.      Allergies  Allergen Reactions  . Augmentin [Amoxicillin-Pot Clavulanate] Itching and Nausea And Vomiting  . Codeine Itching and Nausea And Vomiting  . Penicillins Hives and Itching    Has patient had a PCN reaction causing immediate rash, facial/tongue/throat swelling, SOB or lightheadedness with hypotension: No Has patient had a PCN reaction causing severe rash involving mucus membranes or skin necrosis: No Has patient had a PCN reaction that required hospitalization No Has patient had a PCN reaction occurring within the last 10 years: No If all of the above answers are "NO", then may proceed with Cephalosporin use.   . Atorvastatin     Myalgias and fatigue  . Oxycontin [Oxycodone Hcl] Hives and Itching    Review Of Systems:  Constitutional:   No  weight loss, night sweats,  Fevers, chills, fatigue, or  lassitude.  HEENT:   No headaches,  Difficulty swallowing,  Tooth/dental problems, or  Sore throat,                No sneezing, itching, ear ache, nasal congestion, post nasal drip,   CV:  No chest pain,  Orthopnea, PND, swelling in lower extremities, anasarca, dizziness, palpitations, syncope.   GI  No heartburn, indigestion, abdominal pain, nausea, vomiting, diarrhea, change in bowel habits, loss of appetite, bloody stools.   Resp: No shortness of breath with exertion or at rest.  No excess mucus, no productive cough,  No non-productive cough,  No coughing up of blood.   No change in color of mucus.  No wheezing.  No chest wall deformity  Skin: no rash or lesions.  GU: no dysuria, change in color of urine, no urgency or frequency.  No flank pain, no hematuria   MS:  No joint pain or swelling.  No decreased range of motion.  No back pain.  Psych:  No change in mood or affect. No depression or anxiety.  No memory loss.   Vital Signs BP 124/80 (BP Location: Left Arm, Cuff Size: Normal)   Pulse 78   SpO2 94%    Physical Exam:  General- No distress,  A&Ox3, pleasant ENT: No sinus tenderness, TM clear, pale nasal mucosa, no oral exudate,no post nasal drip, no LAN Cardiac: S1, S2, regular rate and rhythm, no murmur Chest: No wheeze/ rales/ dullness; no accessory muscle use, no nasal flaring, no sternal retractions Abd.: Soft Non-tender, nondistended, protuberant Ext: No clubbing cyanosis, edema Neuro:  normal strength Skin: No rashes, warm and dry Psych: normal mood and behavior   Assessment/Plan  OSA (obstructive sleep apnea) Patient is benefiting from therapy Feels he needs a higher pressure as the machine is ramping up Plan We will send in a prescription to  increase pressure to Auto Set 8-15. Continue on CPAP at bedtime. You appear to be benefiting from the treatment Goal is to wear for at least 6 hours each night for maximal clinical benefit. Continue to work on weight loss, as the link between excess weight  and sleep apnea is well established.  Do not drive if sleepy. Remember to clean mask, tubing, filter, and reservoir once weekly with soapy water.  Follow up with Maralyn Sago NP  In 3 months  or before as needed.  Please contact office for sooner follow up if symptoms do not improve or worsen or seek emergency care       Bevelyn Ngo, NP 11/15/2016  11:27 PM

## 2016-11-16 NOTE — Progress Notes (Signed)
I have reviewed and agree with assessment/plan.  Panagiota Perfetti, MD [facility] 11/16/2016, 11:48 AM Pager:  [phone]  

## 2016-11-19 ENCOUNTER — Ambulatory Visit: Payer: BLUE CROSS/BLUE SHIELD | Admitting: Family Medicine

## 2017-01-16 ENCOUNTER — Encounter: Payer: Self-pay | Admitting: Family Medicine

## 2017-01-16 ENCOUNTER — Ambulatory Visit: Payer: BLUE CROSS/BLUE SHIELD | Admitting: Family Medicine

## 2017-01-16 VITALS — BP 115/66 | HR 74 | Temp 97.0°F | Ht 71.0 in | Wt 235.0 lb

## 2017-01-16 DIAGNOSIS — J410 Simple chronic bronchitis: Secondary | ICD-10-CM | POA: Diagnosis not present

## 2017-01-16 DIAGNOSIS — R9389 Abnormal findings on diagnostic imaging of other specified body structures: Secondary | ICD-10-CM

## 2017-01-16 DIAGNOSIS — Z23 Encounter for immunization: Secondary | ICD-10-CM

## 2017-01-16 DIAGNOSIS — K219 Gastro-esophageal reflux disease without esophagitis: Secondary | ICD-10-CM

## 2017-01-16 DIAGNOSIS — E1165 Type 2 diabetes mellitus with hyperglycemia: Secondary | ICD-10-CM

## 2017-01-16 DIAGNOSIS — K760 Fatty (change of) liver, not elsewhere classified: Secondary | ICD-10-CM | POA: Diagnosis not present

## 2017-01-16 DIAGNOSIS — G4733 Obstructive sleep apnea (adult) (pediatric): Secondary | ICD-10-CM

## 2017-01-16 DIAGNOSIS — J61 Pneumoconiosis due to asbestos and other mineral fibers: Secondary | ICD-10-CM

## 2017-01-16 DIAGNOSIS — I10 Essential (primary) hypertension: Secondary | ICD-10-CM

## 2017-01-16 DIAGNOSIS — E7800 Pure hypercholesterolemia, unspecified: Secondary | ICD-10-CM | POA: Diagnosis not present

## 2017-01-16 DIAGNOSIS — E559 Vitamin D deficiency, unspecified: Secondary | ICD-10-CM

## 2017-01-16 LAB — BAYER DCA HB A1C WAIVED: HB A1C (BAYER DCA - WAIVED): 8.1 % — ABNORMAL HIGH (ref <7.0)

## 2017-01-16 NOTE — Progress Notes (Signed)
Subjective:    Patient ID: Scott [name], male    DOB: 1955/11/07, 61 y.o.   MRN: [id number]  HPI Pt here for follow up and management of chronic medical problems which includes hyperlipidemia and diabetes. He is taking medication regularly.  Patient today is complaining of some neck and muscle pain.  He is requesting refills on his medicines.  He will get lab work today and will be given an FOBT to return.  He will also get his flu shot today.  He had a CT scan done for lung cancer screening in May of this year.  They recommend continual annual screening low-dose chest CT without contrast in 12 months.  He does have aortic atherosclerosis and three-vessel coronary artery disease.  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  The patient is going to be following someone it [facility] for his asbestosis.  He will take a copy of this report on the CT scan with him to that visit and he understands that he should have another repeat CT scan in May of next year.  He denies any chest pain or shortness of breath.  He does have some problems with swallowing but has seen the gastroenterologist for this and has been reassured after swallowing test that everything was okay.  He does have a firm bowel movement in the morning and a loose bowel movement after that but this is a routine for him.  He denies any blood in the stool or black tarry bowel movements.  He is due a colonoscopy next year and the gastroenterologist is aware that.  He is passing his water without problems.  He is somewhat concerned about the salivary gland surgery that he had and the specialist that did this is no longer present.  We talked about this and he will call and arrange to see another ear nose and throat specialist that he is seen in the past, Dr. Janace Hoard.  I think he needs reassurance about this.  He does see the ophthalmologist every 6 months because of cataracts and he wants to go see an ophthalmologist and  we made some recommendations to him for these people.  He says that his blood sugars at home run between 101 30.    Patient Active Problem List   Diagnosis Date Noted  . OSA (obstructive sleep apnea) 08/09/2016  . Cholelithiasis with chronic cholecystitis 01/09/2016  . Chest pain 11/29/2015  . Vitamin D deficiency 10/06/2015  . Hyperlipidemia 02/12/2013  . Tobacco use disorder 01/05/2013  . Back pain, acute 12/13/2012  . Elevated blood pressure 12/04/2012  . H/O asbestosis 12/04/2012  . Erectile dysfunction 12/04/2012  . Neuropathy of both upper extremities 12/04/2012  . Degenerative arthritis of cervical spine 12/04/2012  . BPH (benign prostatic hyperplasia) 12/04/2012  . GERD (gastroesophageal reflux disease) 06/03/2012  . Diabetes mellitus type 2, controlled ([place]) 06/03/2012  . Generalized anxiety disorder 06/03/2012   Outpatient Encounter Medications as of 01/16/2017  Medication Sig  . albuterol (PROVENTIL HFA;VENTOLIN HFA) 108 (90 Base) MCG/ACT inhaler Inhale 2 puffs into the lungs every 6 (six) hours as needed for wheezing.  Marland Kitchen aspirin 81 MG tablet Take 1 tablet (81 mg total) by mouth daily.  . cycloSPORINE (RESTASIS) 0.05 % ophthalmic emulsion Place 1 drop into both eyes 2 (two) times daily.   . empagliflozin (JARDIANCE) 25 MG TABS tablet Take 25 mg by mouth daily.  . fenofibrate 160 MG tablet Take 1 tablet (160 mg total) by mouth  daily.  . lisinopril (PRINIVIL,ZESTRIL) 5 MG tablet Take 1 tablet (5 mg total) by mouth daily.  . metFORMIN (GLUCOPHAGE) 500 MG tablet TAKE 2 TABLETS (1,000 MG TOTAL) BY MOUTH 2 (TWO) TIMES DAILY.  Marland Kitchen Vitamin D, Ergocalciferol, (DRISDOL) 50000 units CAPS capsule TAKE ONE CAPSULE EVERY SEVEN DAYS  . [DISCONTINUED] Suvorexant (BELSOMRA) 15 MG TABS Take 1 tablet by mouth at bedtime as needed.   No facility-administered encounter medications on file as of 01/16/2017.      Review of Systems  Constitutional: Negative.   HENT: Negative.   Eyes:  Negative.   Respiratory: Negative.   Cardiovascular: Negative.   Gastrointestinal: Negative.   Endocrine: Negative.   Genitourinary: Negative.   Musculoskeletal: Negative.   Skin: Negative.        Neck tissue pain - worse at night   Allergic/Immunologic: Negative.   Neurological: Negative.   Hematological: Negative.   Psychiatric/Behavioral: Negative.        Objective:   Physical Exam  Constitutional: He is oriented to person, place, and time. He appears well-developed and well-nourished. No distress.  The patient is pleasant and alert and in good spirits and continues to stay off of his cigarettes and is watching his diet more closely and working aggressively on good health care  HENT:  Head: Normocephalic and atraumatic.  Right Ear: External ear normal.  Left Ear: External ear normal.  Nose: Nose normal.  Mouth/Throat: Oropharynx is clear and moist. No oropharyngeal exudate.  Eyes: Conjunctivae and EOM are normal. Pupils are equal, round, and reactive to light. Right eye exhibits no discharge. Left eye exhibits no discharge. No scleral icterus.  Neck: Normal range of motion. Neck supple. No thyromegaly present.  Slight deformity right neck secondary to salivary gland surgery. No thyromegaly or adenopathy palpable  Cardiovascular: Normal rate, regular rhythm, normal heart sounds and intact distal pulses.  No murmur heard. Heart is regular at 72/min  Pulmonary/Chest: Effort normal and breath sounds normal. No respiratory distress. He has no wheezes. He has no rales. He exhibits no tenderness.  Abdominal: Soft. Bowel sounds are normal. He exhibits no mass. There is no tenderness. There is no rebound and no guarding.  No abdominal tenderness masses bruits or inguinal adenopathy  Musculoskeletal: Normal range of motion. He exhibits no edema.  Lymphadenopathy:    He has no cervical adenopathy.  Neurological: He is alert and oriented to person, place, and time. He has normal  reflexes. No cranial nerve deficit.  Skin: Skin is warm and dry. No rash noted.  Psychiatric: He has a normal mood and affect. His behavior is normal. Judgment and thought content normal.  Nursing note and vitals reviewed.  BP 115/66 (BP Location: Right Arm)   Pulse 74   Temp (!) 97 F (36.1 C) (Oral)   Ht 5' 11"  (1.803 m)   Wt 235 lb (106.6 kg)   BMI 32.78 kg/m         Assessment & Plan:  1. Uncontrolled type 2 diabetes mellitus with hyperglycemia, without long-term current use of insulin ([place]) -New current treatment pending results of lab work - CBC with Differential/Platelet - [facility] Hb A1c Waived  2. Gastroesophageal reflux disease, esophagitis presence not specified -Continue to watch foods that are being eaten so that you do not get choked easily and chew foods well before swallowing - CBC with Differential/Platelet - Hepatic function panel  3. Pure hypercholesterolemia -Continue aggressive therapeutic lifestyle changes and fenofibrate as well as good blood sugar control -  Lipid panel - CBC with Differential/Platelet  4. Essential hypertension, benign -Blood pressure is good today and continue current treatment - BMP8+EGFR - CBC with Differential/Platelet - Hepatic function panel  5. Vitamin D deficiency -Continue current treatment pending results of lab work - CBC with Differential/Platelet - VITAMIN D 25 Hydroxy (Vit-D Deficiency, Fractures)  6. Asbestosis [facility]) -Get annual chest CTs as recommended and take a copy of the CT scan report from May with you to see the pulmonologist at [facility]  7. Abnormal chest CT -Follow-up annually  8. OSA (obstructive sleep apnea) -Continue with CPAP  9. Simple chronic bronchitis (HCC) -Continue to avoid irritating environments and refrain from smoking  10. Hepatic steatosis -Lose weight through diet and exercise  No orders of the defined types were placed in this  encounter.  Patient Instructions  Continue current medications. Continue good therapeutic lifestyle changes which include good diet and exercise. Fall precautions discussed with patient. If an FOBT was given today- please return it to our front desk. If you are over 59 years old - you may need Prevnar 86 or the adult Pneumonia vaccine.  **Flu shots are available--- please call and schedule a FLU-CLINIC appointment**  After your visit with [place] today you will receive a survey in the mail or online from [employer] regarding your care with [place]. Please take a moment to fill this out. Your feedback is very important to [place] as you can help [place] better understand your patient needs as well as improve your experience and satisfaction. WE CARE ABOUT YOU!!!   Please call the ear nose and throat specialist and make an appointment to follow-up with the surgery that has been done previously for the salivary gland Make sure that you get a repeat CT scan sometime next spring Make sure that you get a colonoscopy next year Continue to work on on weight with exercise and diet These call [facility] eye physicians in [place] on [address]. and get an appointment for good eye exam and tell them that we referred you and that you have diabetes.  Dr. Renford Dills or Everrett Coombe MD

## 2017-01-16 NOTE — Patient Instructions (Addendum)
Continue current medications. Continue good therapeutic lifestyle changes which include good diet and exercise. Fall precautions discussed with patient. If an FOBT was given today- please return it to our front desk. If you are over 61 years old - you may need Prevnar 13 or the adult Pneumonia vaccine.  **Flu shots are available--- please call and schedule a FLU-CLINIC appointment**  After your visit with us today you will receive a survey in the mail or online from [employer] regarding your care with us. Please take a moment to fill this out. Your feedback is very important to us as you can help us better understand your patient needs as well as improve your experience and satisfaction. WE CARE ABOUT YOU!!!   Please call the ear nose and throat specialist and make an appointment to follow-up with the surgery that has been done previously for the salivary gland Make sure that you get a repeat CT scan sometime next spring Make sure that you get a colonoscopy next year Continue to work on on weight with exercise and diet These call [facility] physicians in [place] on [address] and get an appointment for good eye exam and tell them that we referred you and that you have diabetes.  Dr. Leonia CoronaGrevin or Fannie KneeGigangack

## 2017-01-17 LAB — CBC WITH DIFFERENTIAL/PLATELET
BASOS ABS: 0.1 10*3/uL (ref 0.0–0.2)
Basos: 1 %
EOS (ABSOLUTE): 0.2 10*3/uL (ref 0.0–0.4)
Eos: 3 %
Hematocrit: 45.4 % (ref 37.5–51.0)
Hemoglobin: 15.3 g/dL (ref 13.0–17.7)
IMMATURE GRANS (ABS): 0 10*3/uL (ref 0.0–0.1)
IMMATURE GRANULOCYTES: 1 %
LYMPHS: 38 %
Lymphocytes Absolute: 2.2 10*3/uL (ref 0.7–3.1)
MCH: 30.8 pg (ref 26.6–33.0)
MCHC: 33.7 g/dL (ref 31.5–35.7)
MCV: 92 fL (ref 79–97)
MONOS ABS: 0.4 10*3/uL (ref 0.1–0.9)
Monocytes: 7 %
NEUTROS PCT: 50 %
Neutrophils Absolute: 2.9 10*3/uL (ref 1.4–7.0)
PLATELETS: 239 10*3/uL (ref 150–379)
RBC: 4.96 x10E6/uL (ref 4.14–5.80)
RDW: 13.7 % (ref 12.3–15.4)
WBC: 5.7 10*3/uL (ref 3.4–10.8)

## 2017-01-17 LAB — LIPID PANEL
CHOL/HDL RATIO: 3.8 ratio (ref 0.0–5.0)
Cholesterol, Total: 146 mg/dL (ref 100–199)
HDL: 38 mg/dL — ABNORMAL LOW (ref >39)
LDL Calculated: 71 mg/dL (ref 0–99)
Triglycerides: 184 mg/dL — ABNORMAL HIGH (ref 0–149)
VLDL Cholesterol Cal: 37 mg/dL (ref 5–40)

## 2017-01-17 LAB — HEPATIC FUNCTION PANEL
ALT: 27 IU/L (ref 0–44)
AST: 20 IU/L (ref 0–40)
Albumin: 4.8 g/dL (ref 3.6–4.8)
Alkaline Phosphatase: 49 IU/L (ref 39–117)
BILIRUBIN TOTAL: 0.5 mg/dL (ref 0.0–1.2)
Bilirubin, Direct: 0.18 mg/dL (ref 0.00–0.40)
Total Protein: 7.3 g/dL (ref 6.0–8.5)

## 2017-01-17 LAB — BMP8+EGFR
BUN / CREAT RATIO: 16 (ref 10–24)
BUN: 17 mg/dL (ref 8–27)
CO2: 20 mmol/L (ref 20–29)
CREATININE: 1.08 mg/dL (ref 0.76–1.27)
Calcium: 9.3 mg/dL (ref 8.6–10.2)
Chloride: 103 mmol/L (ref 96–106)
GFR, EST AFRICAN AMERICAN: 85 mL/min/{1.73_m2} (ref >59)
GFR, EST NON AFRICAN AMERICAN: 74 mL/min/{1.73_m2} (ref >59)
Glucose: 113 mg/dL — ABNORMAL HIGH (ref 65–99)
Potassium: 4.5 mmol/L (ref 3.5–5.2)
SODIUM: 137 mmol/L (ref 134–144)

## 2017-01-17 LAB — VITAMIN D 25 HYDROXY (VIT D DEFICIENCY, FRACTURES): VIT D 25 HYDROXY: 39.4 ng/mL (ref 30.0–100.0)

## 2017-01-18 ENCOUNTER — Other Ambulatory Visit: Payer: Self-pay | Admitting: *Deleted

## 2017-01-18 MED ORDER — ROSUVASTATIN CALCIUM 5 MG PO TABS: 5.0000 mg | ORAL_TABLET | Freq: Every day | ORAL | 1 refills | Status: DC

## 2017-02-07 DIAGNOSIS — K210 Gastro-esophageal reflux disease with esophagitis: Secondary | ICD-10-CM | POA: Diagnosis not present

## 2017-02-07 DIAGNOSIS — R109 Unspecified abdominal pain: Secondary | ICD-10-CM | POA: Diagnosis not present

## 2017-02-17 ENCOUNTER — Other Ambulatory Visit: Payer: Self-pay | Admitting: Family Medicine

## 2017-04-18 ENCOUNTER — Other Ambulatory Visit: Payer: Self-pay | Admitting: Family Medicine

## 2017-04-29 ENCOUNTER — Telehealth: Payer: Self-pay | Admitting: Family Medicine

## 2017-04-29 NOTE — Telephone Encounter (Signed)
Please address

## 2017-05-01 NOTE — Telephone Encounter (Signed)
Pt got the med figured out and filled

## 2017-05-27 ENCOUNTER — Other Ambulatory Visit: Payer: Self-pay | Admitting: Family Medicine

## 2017-05-27 NOTE — Telephone Encounter (Signed)
Next OV 06/14/17

## 2017-06-06 ENCOUNTER — Other Ambulatory Visit: Payer: BLUE CROSS/BLUE SHIELD

## 2017-06-06 DIAGNOSIS — E7800 Pure hypercholesterolemia, unspecified: Secondary | ICD-10-CM | POA: Diagnosis not present

## 2017-06-06 DIAGNOSIS — E559 Vitamin D deficiency, unspecified: Secondary | ICD-10-CM | POA: Diagnosis not present

## 2017-06-06 DIAGNOSIS — E1165 Type 2 diabetes mellitus with hyperglycemia: Secondary | ICD-10-CM | POA: Diagnosis not present

## 2017-06-06 DIAGNOSIS — Z1211 Encounter for screening for malignant neoplasm of colon: Secondary | ICD-10-CM

## 2017-06-06 DIAGNOSIS — I10 Essential (primary) hypertension: Secondary | ICD-10-CM | POA: Diagnosis not present

## 2017-06-06 DIAGNOSIS — K219 Gastro-esophageal reflux disease without esophagitis: Secondary | ICD-10-CM

## 2017-06-06 LAB — BAYER DCA HB A1C WAIVED: HB A1C (BAYER DCA - WAIVED): 6.8 % (ref <7.0)

## 2017-06-06 NOTE — Addendum Note (Signed)
Addended by: Magdalene RiverBULLINS, Signora Zucco H on: 06/06/2017 02:03 PM   Modules accepted: Orders

## 2017-06-07 LAB — BMP8+EGFR
BUN/Creatinine Ratio: 21 (ref 10–24)
BUN: 23 mg/dL (ref 8–27)
CO2: 22 mmol/L (ref 20–29)
Calcium: 9.1 mg/dL (ref 8.6–10.2)
Chloride: 103 mmol/L (ref 96–106)
Creatinine, Ser: 1.07 mg/dL (ref 0.76–1.27)
GFR calc Af Amer: 86 mL/min/{1.73_m2} (ref >59)
GFR, EST NON AFRICAN AMERICAN: 74 mL/min/{1.73_m2} (ref >59)
Glucose: 145 mg/dL — ABNORMAL HIGH (ref 65–99)
POTASSIUM: 4.9 mmol/L (ref 3.5–5.2)
SODIUM: 138 mmol/L (ref 134–144)

## 2017-06-07 LAB — CBC WITH DIFFERENTIAL/PLATELET
BASOS: 1 %
Basophils Absolute: 0 10*3/uL (ref 0.0–0.2)
EOS (ABSOLUTE): 0.3 10*3/uL (ref 0.0–0.4)
Eos: 5 %
Hematocrit: 43.3 % (ref 37.5–51.0)
Hemoglobin: 15 g/dL (ref 13.0–17.7)
Immature Grans (Abs): 0 10*3/uL (ref 0.0–0.1)
Immature Granulocytes: 0 %
LYMPHS ABS: 2.1 10*3/uL (ref 0.7–3.1)
Lymphs: 32 %
MCH: 30.9 pg (ref 26.6–33.0)
MCHC: 34.6 g/dL (ref 31.5–35.7)
MCV: 89 fL (ref 79–97)
MONOS ABS: 0.7 10*3/uL (ref 0.1–0.9)
Monocytes: 10 %
NEUTROS PCT: 52 %
Neutrophils Absolute: 3.5 10*3/uL (ref 1.4–7.0)
PLATELETS: 242 10*3/uL (ref 150–379)
RBC: 4.86 x10E6/uL (ref 4.14–5.80)
RDW: 13.9 % (ref 12.3–15.4)
WBC: 6.6 10*3/uL (ref 3.4–10.8)

## 2017-06-07 LAB — HEPATIC FUNCTION PANEL
ALT: 19 IU/L (ref 0–44)
AST: 15 IU/L (ref 0–40)
Albumin: 4.5 g/dL (ref 3.6–4.8)
Alkaline Phosphatase: 48 IU/L (ref 39–117)
BILIRUBIN TOTAL: 0.3 mg/dL (ref 0.0–1.2)
Bilirubin, Direct: 0.13 mg/dL (ref 0.00–0.40)
Total Protein: 7 g/dL (ref 6.0–8.5)

## 2017-06-07 LAB — LIPID PANEL
Chol/HDL Ratio: 4.1 ratio (ref 0.0–5.0)
Cholesterol, Total: 144 mg/dL (ref 100–199)
HDL: 35 mg/dL — ABNORMAL LOW (ref >39)
LDL Calculated: 76 mg/dL (ref 0–99)
Triglycerides: 164 mg/dL — ABNORMAL HIGH (ref 0–149)
VLDL Cholesterol Cal: 33 mg/dL (ref 5–40)

## 2017-06-07 LAB — FECAL OCCULT BLOOD, IMMUNOCHEMICAL: FECAL OCCULT BLD: NEGATIVE

## 2017-06-07 LAB — VITAMIN D 25 HYDROXY (VIT D DEFICIENCY, FRACTURES): VIT D 25 HYDROXY: 39.7 ng/mL (ref 30.0–100.0)

## 2017-06-14 ENCOUNTER — Ambulatory Visit: Payer: BLUE CROSS/BLUE SHIELD | Admitting: Family Medicine

## 2017-06-14 ENCOUNTER — Encounter: Payer: Self-pay | Admitting: Family Medicine

## 2017-06-14 VITALS — BP 123/74 | HR 82 | Temp 97.2°F | Ht 71.0 in | Wt 227.0 lb

## 2017-06-14 DIAGNOSIS — F5101 Primary insomnia: Secondary | ICD-10-CM | POA: Insufficient documentation

## 2017-06-14 DIAGNOSIS — I10 Essential (primary) hypertension: Secondary | ICD-10-CM | POA: Diagnosis not present

## 2017-06-14 DIAGNOSIS — K219 Gastro-esophageal reflux disease without esophagitis: Secondary | ICD-10-CM | POA: Diagnosis not present

## 2017-06-14 DIAGNOSIS — E1165 Type 2 diabetes mellitus with hyperglycemia: Secondary | ICD-10-CM

## 2017-06-14 DIAGNOSIS — E7800 Pure hypercholesterolemia, unspecified: Secondary | ICD-10-CM | POA: Diagnosis not present

## 2017-06-14 DIAGNOSIS — E559 Vitamin D deficiency, unspecified: Secondary | ICD-10-CM | POA: Diagnosis not present

## 2017-06-14 MED ORDER — LISINOPRIL 5 MG PO TABS: 5.0000 mg | ORAL_TABLET | Freq: Every day | ORAL | 3 refills | Status: DC

## 2017-06-14 MED ORDER — TRAZODONE HCL 50 MG PO TABS: 25.0000 mg | ORAL_TABLET | Freq: Every evening | ORAL | 0 refills | Status: DC | PRN

## 2017-06-14 MED ORDER — FENOFIBRATE 160 MG PO TABS: 160.0000 mg | ORAL_TABLET | Freq: Every day | ORAL | 3 refills | Status: DC

## 2017-06-14 MED ORDER — METFORMIN HCL 500 MG PO TABS: ORAL_TABLET | ORAL | 3 refills | Status: DC

## 2017-06-14 MED ORDER — EMPAGLIFLOZIN 25 MG PO TABS: 25.0000 mg | ORAL_TABLET | Freq: Every day | ORAL | 3 refills | Status: DC

## 2017-06-14 NOTE — Progress Notes (Signed)
Subjective:    Patient ID: Scott [name], male    DOB: Jun 01, 1955, 62 y.o.   MRN: [id number]  HPI Pt here for follow up and management of chronic medical problems which includes diabetes, hyperlipidemia and hypertension. He is taking medication regularly.  She is still having issues with insomnia and was unable to take Belsomra.  He has had lab work done and we will review this with him during the visit today.  The hemoglobin A1c was much improved and is now 6.8%.  4 months ago it was 8.1%.  The blood sugar was elevated at 145.  The creatinine and all of the electrolytes were within normal limits.  The CBC was within normal limits with a good hemoglobin at 15 and a normal white blood cell count and an adequate platelet count.  Cholesterol numbers did have an elevated triglyceride which was 164 but improved from previously when it was 184.  Good cholesterol remains low at 35 the LDL C cholesterol is excellent at 76.  The vitamin D level is stable at 39.7.  All liver function tests are normal.  Patient is feeling well and positive about himself.  He just has trouble sleeping and part of the sleeping issue is getting up at nighttime to go to the bathroom.  He is walking more and exercising more and I think this is the key to his better blood sugar control and triglyceride control and he was encouraged to keep doing this.  He denies any chest pain pressure or tightness.  He denies any trouble with shortness of breath.  His bowels are moving regularly and he is due to get a colonoscopy this fall and already has this on his schedule as that will be his 10-year anniversary for this.  He is passing his water well other than having to get up at nighttime to go to the bathroom.   Patient Active Problem List   Diagnosis Date Noted  . OSA (obstructive sleep apnea) 08/09/2016  . Cholelithiasis with chronic cholecystitis 01/09/2016  . Chest pain 11/29/2015  . Vitamin D deficiency 10/06/2015  . Hyperlipidemia  02/12/2013  . Tobacco use disorder 01/05/2013  . Back pain, acute 12/13/2012  . Elevated blood pressure 12/04/2012  . H/O asbestosis 12/04/2012  . Erectile dysfunction 12/04/2012  . Neuropathy of both upper extremities 12/04/2012  . Degenerative arthritis of cervical spine 12/04/2012  . BPH (benign prostatic hyperplasia) 12/04/2012  . GERD (gastroesophageal reflux disease) 06/03/2012  . Diabetes mellitus type 2, controlled (HCC) 06/03/2012  . Generalized anxiety disorder 06/03/2012   Outpatient Encounter Medications as of 06/14/2017  Medication Sig  . albuterol (PROVENTIL HFA;VENTOLIN HFA) 108 (90 Base) MCG/ACT inhaler Inhale 2 puffs into the lungs every 6 (six) hours as needed for wheezing.  Marland Kitchen aspirin 81 MG tablet Take 1 tablet (81 mg total) by mouth daily.  . cycloSPORINE (RESTASIS) 0.05 % ophthalmic emulsion Place 1 drop into both eyes 2 (two) times daily.   . fenofibrate 160 MG tablet Take 1 tablet (160 mg total) by mouth daily.  Marland Kitchen JARDIANCE 25 MG TABS tablet TAKE 1 TABLET BY MOUTH EVERY DAY  . lisinopril (PRINIVIL,ZESTRIL) 5 MG tablet Take 1 tablet (5 mg total) by mouth daily.  . metFORMIN (GLUCOPHAGE) 500 MG tablet TAKE 2 TABLETS (1,000 MG TOTAL) BY MOUTH 2 (TWO) TIMES DAILY.  Marland Kitchen Vitamin D, Ergocalciferol, (DRISDOL) 50000 units CAPS capsule TAKE ONE CAPSULE EVERY SEVEN DAYS  . [DISCONTINUED] rosuvastatin (CRESTOR) 5 MG tablet Take 1 tablet (5 mg  total) daily by mouth. As directed   No facility-administered encounter medications on file as of 06/14/2017.       Review of Systems  Constitutional: Negative.        Sleep issues  HENT: Negative.   Eyes: Negative.   Respiratory: Negative.   Cardiovascular: Negative.   Gastrointestinal: Negative.   Endocrine: Negative.   Genitourinary: Negative.   Musculoskeletal: Negative.   Skin: Negative.   Allergic/Immunologic: Negative.   Neurological: Negative.   Hematological: Negative.   Psychiatric/Behavioral: Negative.          Objective:   Physical Exam  Constitutional: He is oriented to person, place, and time. He appears well-developed and well-nourished. No distress.  The patient is pleasant and alert and positive  HENT:  Head: Normocephalic and atraumatic.  Right Ear: External ear normal.  Left Ear: External ear normal.  Mouth/Throat: Oropharynx is clear and moist. No oropharyngeal exudate.  Slight turbinate congestion left nostril  Eyes: Pupils are equal, round, and reactive to light. Conjunctivae and EOM are normal. Right eye exhibits no discharge. Left eye exhibits no discharge. No scleral icterus.  Neck: Normal range of motion. Neck supple. No thyromegaly present.  No anterior cervical adenopathy or bruits  Cardiovascular: Normal rate, regular rhythm, normal heart sounds and intact distal pulses.  No murmur heard. Heart is regular at 72/min  Pulmonary/Chest: Effort normal and breath sounds normal. No respiratory distress. He has no wheezes. He has no rales. He exhibits no tenderness.  Clear anteriorly and posteriorly and no axillary adenopathy and no chest wall masses  Abdominal: Soft. Bowel sounds are normal. He exhibits no mass. There is no tenderness. There is no rebound and no guarding.  Abdominal obesity present but improving and weight is down 4 pounds since last visit with patient's walking and better diet habits.  No liver or spleen enlargement.  No epigastric tenderness.  No inguinal adenopathy and no bruits or masses.  Musculoskeletal: Normal range of motion. He exhibits no edema.  Lymphadenopathy:    He has no cervical adenopathy.  Neurological: He is alert and oriented to person, place, and time. He has normal reflexes. No cranial nerve deficit.  Skin: Skin is warm and dry. Rash noted.  Dry skin on both feet with nail fungus being present.  Psychiatric: He has a normal mood and affect. His behavior is normal. Judgment and thought content normal.  Nursing note and vitals reviewed.   BP  123/74 (BP Location: Right Arm)   Pulse 82   Temp (!) 97.2 F (36.2 C) (Oral)   Ht 5\' 11"  (1.803 m)   Wt 227 lb (103 kg)   BMI 31.66 kg/m        Assessment & Plan:  1. Gastroesophageal reflux disease, esophagitis presence not specified -Continue with current treatment and diet of avoidance  2. Uncontrolled type 2 diabetes mellitus with hyperglycemia, without long-term current use of insulin (HCC) -The blood sugar is now under better control with an excellent A1c primarily due to patient continuing to watch his diet and more exercise and walking.  The patient was congratulated on his efforts.  3. Pure hypercholesterolemia -Cholesterol numbers are improved and triglyceride is improved but still elevated.  He will continue with his current diet efforts and exercise efforts.  4. Essential hypertension, benign -Blood pressure is good today and he will continue with current treatment  5. Vitamin D deficiency -Continue with vitamin D replacement which is 50,000 units once weekly  6. Primary insomnia -Trial of  trazodone 50 mg 1 at bedtime -Patient will get back in touch with [place] if this does not work  No orders of the defined types were placed in this encounter.  Patient Instructions                       Medicare Annual Wellness Visit  [place] and the medical providers at [facility] strive to bring you the best medical care.  In doing so we not only want to address your current medical conditions and concerns but also to detect new conditions early and prevent illness, disease and health-related problems.    Medicare offers a yearly Wellness Visit which allows our clinical staff to assess your need for preventative services including immunizations, lifestyle education, counseling to decrease risk of preventable diseases and screening for fall risk and other medical concerns.    This visit is provided free of charge (no copay) for all Medicare recipients.  The clinical pharmacists at [facility] have begun to conduct these Wellness Visits which will also include a thorough review of all your medications.    As you primary medical provider recommend that you make an appointment for your Annual Wellness Visit if you have not done so already this year.  You may set up this appointment before you leave today or you may call back [phone]) and schedule an appointment.  Please make sure when you call that you mention that you are scheduling your Annual Wellness Visit with the clinical pharmacist so that the appointment may be made for the proper length of time.    Continue current medications. Continue good therapeutic lifestyle changes which include good diet and exercise. Fall precautions discussed with patient. If an FOBT was given today- please return it to our front desk. If you are over 67 years old - you may need Prevnar 13 or the adult Pneumonia vaccine.  **Flu shots are available--- please call and schedule a FLU-CLINIC appointment**  After your visit with [place] today you will receive a survey in the mail or online from [employer] regarding your care with [place]. Please take a moment to fill this out. Your feedback is very important to [place] as you can help [place] better understand your patient needs as well as improve your experience and satisfaction. WE CARE ABOUT YOU!!!  Get colonoscopy as planned Continue with exercise regimen Try trazodone to see if this helps to sleep at nighttime and get back in touch with [place] about this if it does not help. Try to drink less fluids in the evening Try support hose and the should be put on the first thing with arising in the morning just to see if this helps with voiding more during the day and less at nighttime Continue to watch sodium intake   Nyra Capes MD

## 2017-06-14 NOTE — Patient Instructions (Addendum)
Medicare Annual Wellness Visit  [place] and the medical providers at [facility] strive to bring you the best medical care.  In doing so we not only want to address your current medical conditions and concerns but also to detect new conditions early and prevent illness, disease and health-related problems.    Medicare offers a yearly Wellness Visit which allows our clinical staff to assess your need for preventative services including immunizations, lifestyle education, counseling to decrease risk of preventable diseases and screening for fall risk and other medical concerns.    This visit is provided free of charge (no copay) for all Medicare recipients. The clinical pharmacists at [facility] have begun to conduct these Wellness Visits which will also include a thorough review of all your medications.    As you primary medical provider recommend that you make an appointment for your Annual Wellness Visit if you have not done so already this year.  You may set up this appointment before you leave today or you may call back [phone] and schedule an appointment.  Please make sure when you call that you mention that you are scheduling your Annual Wellness Visit with the clinical pharmacist so that the appointment may be made for the proper length of time.    Continue current medications. Continue good therapeutic lifestyle changes which include good diet and exercise. Fall precautions discussed with patient. If an FOBT was given today- please return it to our front desk. If you are over 62 years old - you may need Prevnar 13 or the adult Pneumonia vaccine.  **Flu shots are available--- please call and schedule a FLU-CLINIC appointment**  After your visit with us today you will receive a survey in the mail or online from [employer] regarding your care with us. Please take a moment to fill this out. Your feedback is very  important to us as you can help us better understand your patient needs as well as improve your experience and satisfaction. WE CARE ABOUT YOU!!!  Get colonoscopy as planned Continue with exercise regimen Try trazodone to see if this helps to sleep at nighttime and get back in touch with us about this if it does not help. Try to drink less fluids in the evening Try support hose and the should be put on the first thing with arising in the morning just to see if this helps with voiding more during the day and less at nighttime Continue to watch sodium intake

## 2017-06-20 ENCOUNTER — Telehealth: Payer: Self-pay | Admitting: Family Medicine

## 2017-06-20 ENCOUNTER — Other Ambulatory Visit: Payer: Self-pay | Admitting: Family Medicine

## 2017-06-20 NOTE — Telephone Encounter (Signed)
It is OK to refill trazodone for three months with one refill

## 2017-06-20 NOTE — Telephone Encounter (Signed)
Per Dr Moore okay tChristell Constanto refill Trazadone 50mg  #90 with 1 refill RX sent to pharmacy per pt request

## 2017-06-20 NOTE — Telephone Encounter (Signed)
Please review and advise.

## 2017-06-20 NOTE — Telephone Encounter (Signed)
RX sent to pharmacy  

## 2017-07-29 IMAGING — CT CT ABD-PELV W/ CM
3 of 5 series · 12 of 36 positions shown, 18 images · IV contrast (READICAT/WATER & [ID] ISOVUE 300)
Comparison: 12/13/2012 CT abdomen/ pelvis. 05/11/2014 abdominal
sonogram.

CLINICAL DATA: Right abdominal and right groin pain for 5 days.

EXAM:
CT ABDOMEN AND PELVIS WITH CONTRAST
TECHNIQUE: Multidetector CT imaging of the abdomen and pelvis was performed
using the standard protocol following bolus administration of
intravenous contrast.
CONTRAST:  125mL [id number] IOPAMIDOL ([id number]) INJECTION 61%

[Series 3: abd/pelvis with · axial · 0.90mm/px · z∈[-416,-106]mm · 6 of 88 slices shown, 11 images]
[im 13/88  soft-tissue]
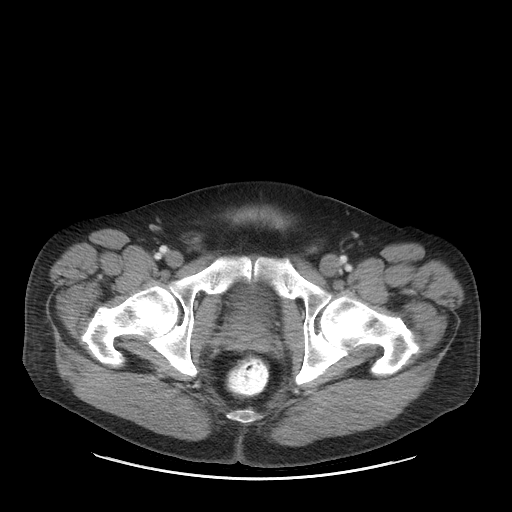
[im 13/88  bone]
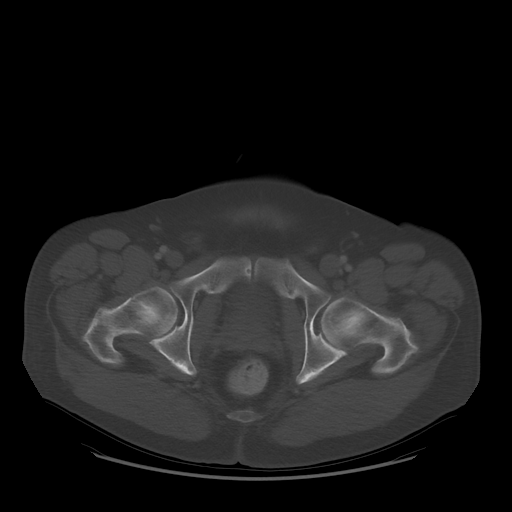
[im 25/88  soft-tissue]
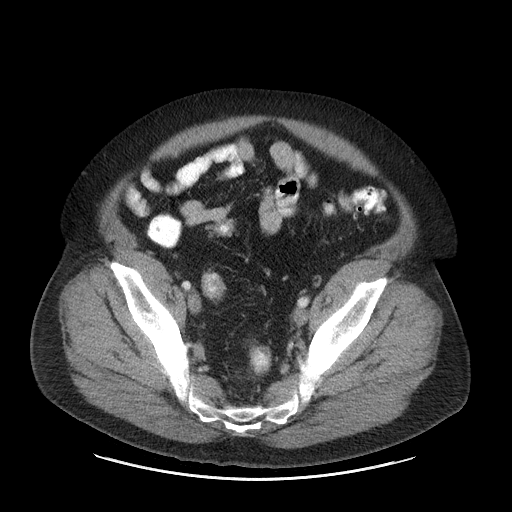
[im 38/88  soft-tissue]
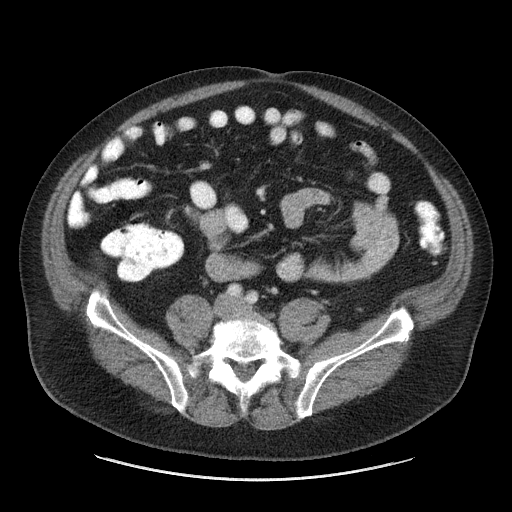
[im 38/88  lung]
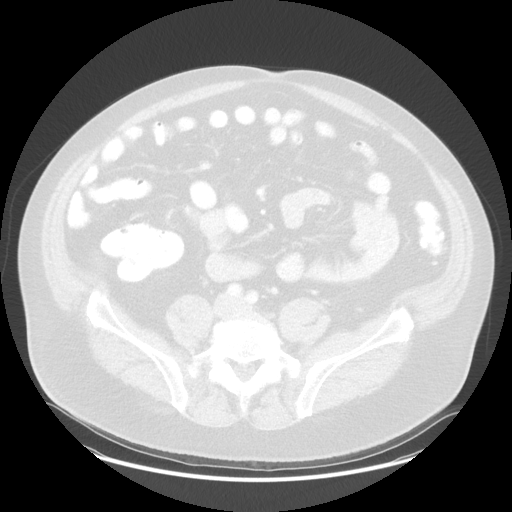
[im 50/88  soft-tissue]
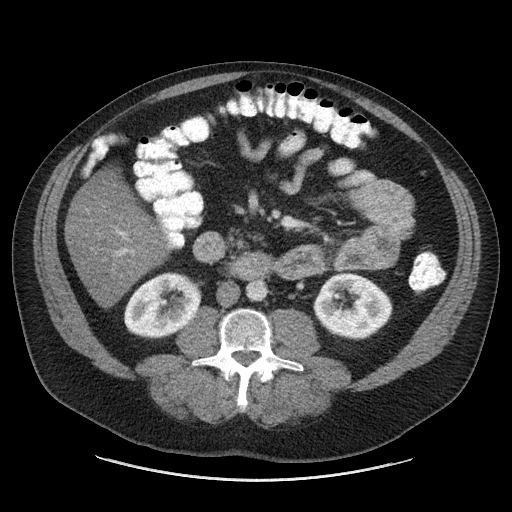
[im 50/88  lung]
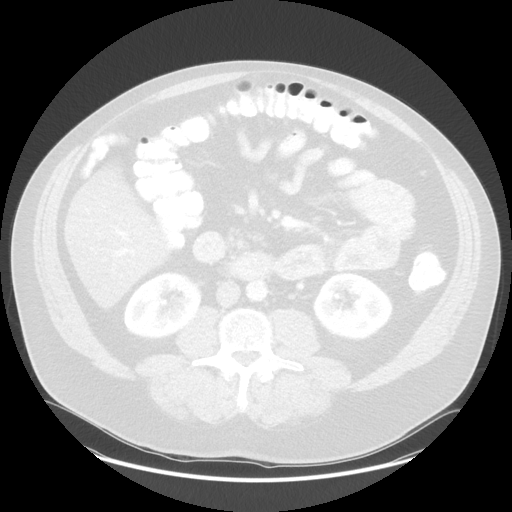
[im 63/88  soft-tissue]
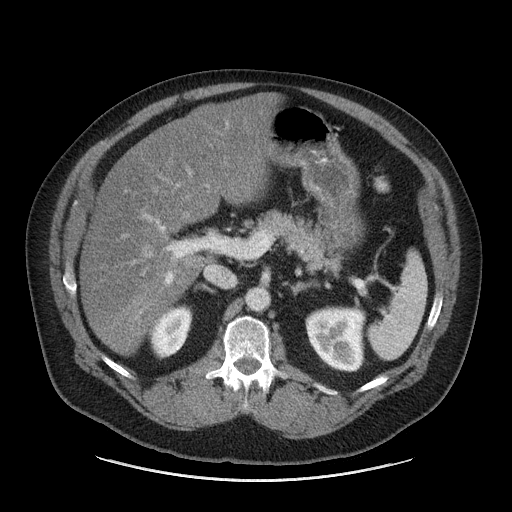
[im 63/88  lung]
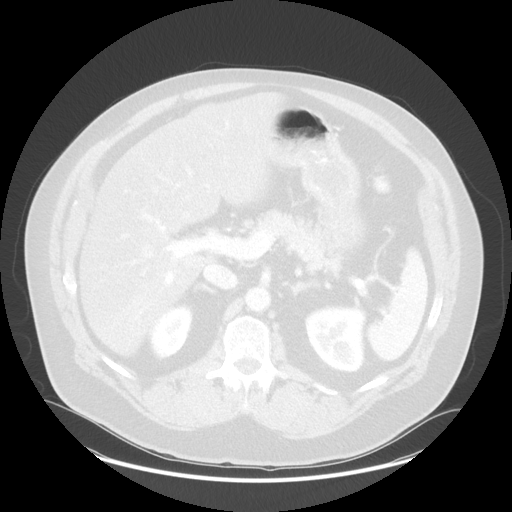
[im 75/88  soft-tissue]
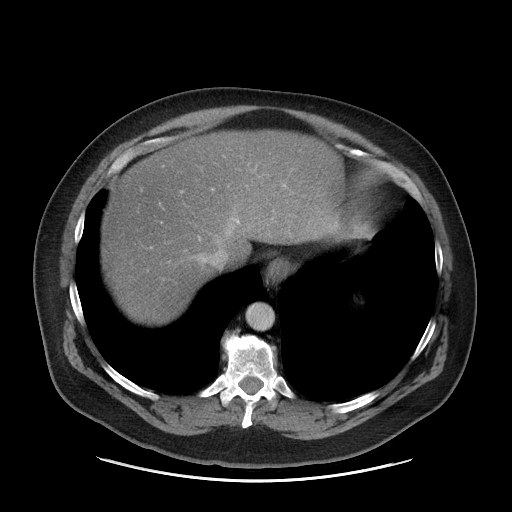
[im 75/88  lung]
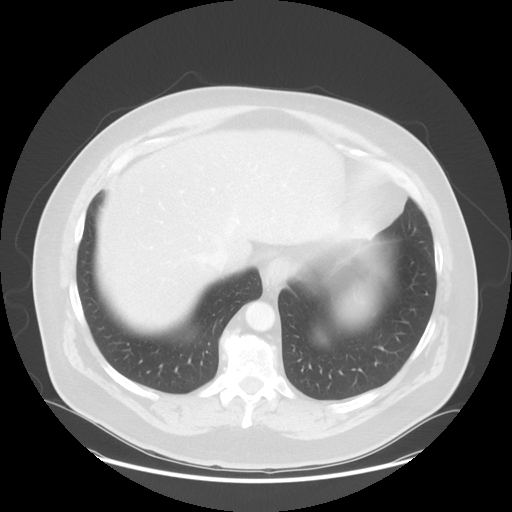

[Series 601: coronal body · coronal · 0.90mm/px · 1 of 150 slices shown, 2 images]
[im 50/150  soft-tissue]
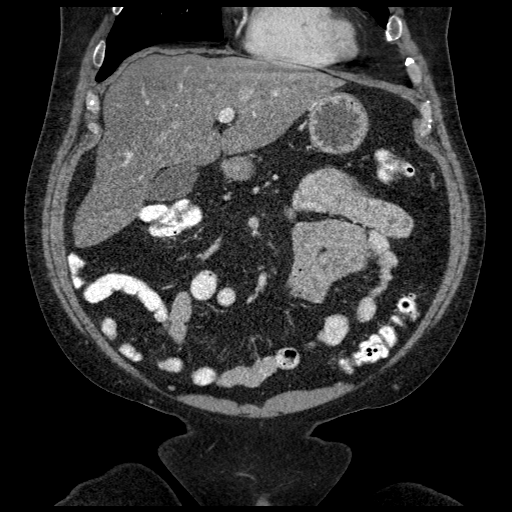
[im 50/150  bone]
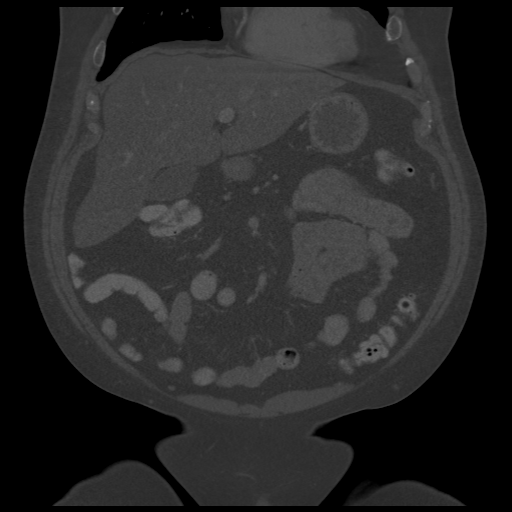

[Series 602: sagittal body · sagittal · 0.90mm/px · 5 of 177 slices shown]
[im 21/177  soft-tissue]
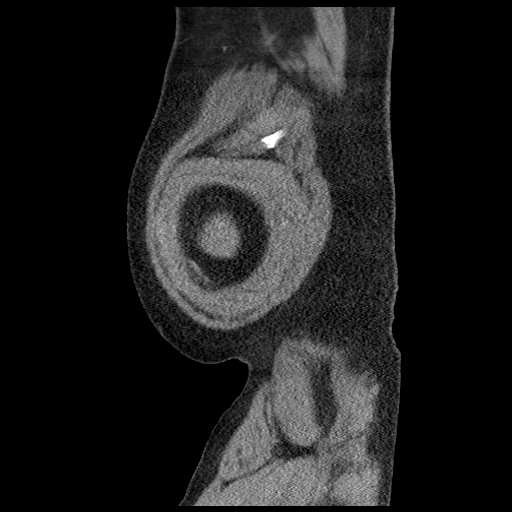
[im 42/177  soft-tissue]
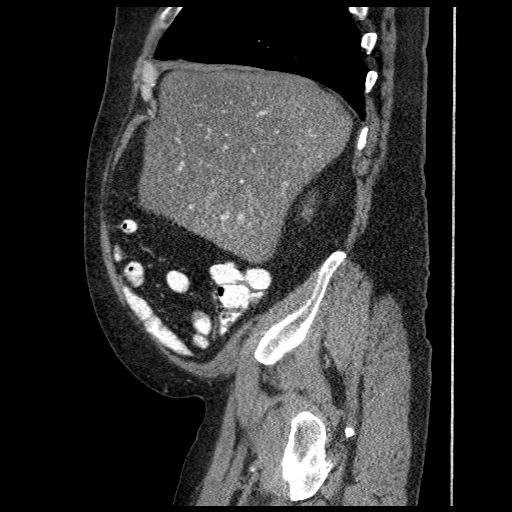
[im 63/177  soft-tissue]
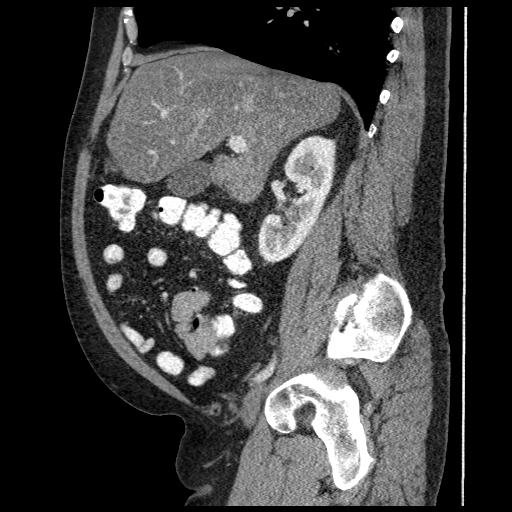
[im 83/177  soft-tissue]
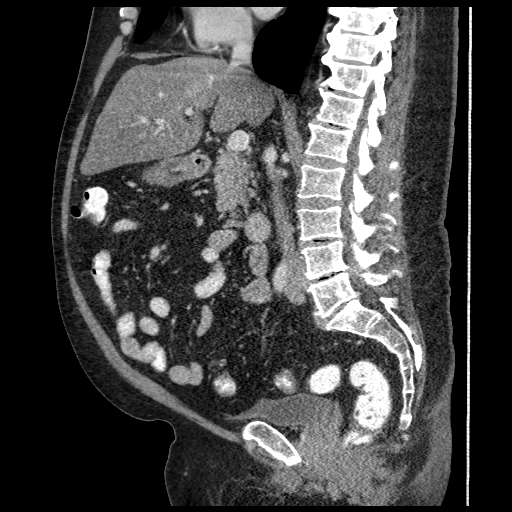
[im 104/177  soft-tissue]
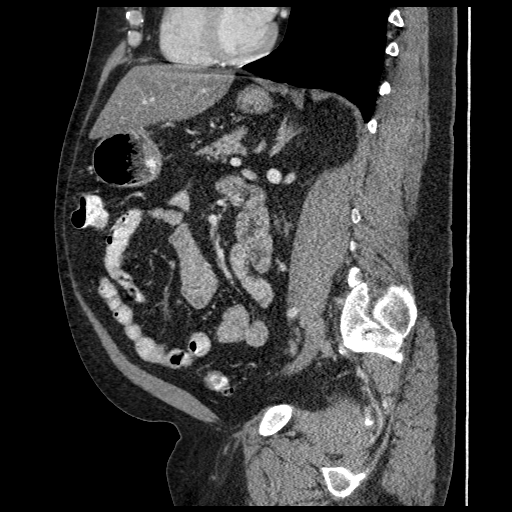

[12 of 36 positions shown; findings below may reference images not displayed]

FINDINGS: Lower chest: No significant pulmonary nodules or acute consolidative
airspace disease.

Hepatobiliary: Severe diffuse hepatic steatosis. No liver mass.
There are layering calcified gallstones measuring up to 9 mm in the
nondistended gallbladder, with no gallbladder wall thickening or
pericholecystic fluid. No biliary ductal dilatation.

Pancreas: Normal, with no mass or duct dilation.

Spleen: Normal size. No mass.

Adrenals/Urinary Tract: Normal adrenals. Normal kidneys with no
hydronephrosis and no renal mass. Normal bladder.

Stomach/Bowel: Grossly normal stomach. Normal caliber small bowel
with no small bowel wall thickening. Normal appendix. Mild to
moderate sigmoid diverticulosis, with no large bowel wall thickening
or pericolonic fat stranding. Oral contrast progresses to the distal
rectum.

Vascular/Lymphatic: Atherosclerotic nonaneurysmal abdominal aorta.
Patent portal, splenic, hepatic and renal veins. No pathologically
enlarged lymph nodes in the abdomen or pelvis.

Reproductive: Normal size prostate.

Other: No pneumoperitoneum, ascites or focal fluid collection.

Musculoskeletal: No aggressive appearing focal osseous lesions.
Moderate degenerative changes in the visualized thoracolumbar spine.
No evidence of an inguinal or ventral abdominal wall hernia.
IMPRESSION: 1. Cholelithiasis, with no evidence of acute cholecystitis.
2. Mild to moderate sigmoid diverticulosis, with no evidence of
acute diverticulitis.
3. No evidence of bowel obstruction or acute bowel inflammation.
Normal appendix.
4. Severe diffuse hepatic steatosis.

## 2017-08-05 ENCOUNTER — Other Ambulatory Visit: Payer: Self-pay | Admitting: Family Medicine

## 2017-08-06 NOTE — Telephone Encounter (Signed)
Last Vit D 06/06/17  39.7

## 2017-08-22 ENCOUNTER — Other Ambulatory Visit: Payer: Self-pay | Admitting: *Deleted

## 2017-08-22 DIAGNOSIS — R9389 Abnormal findings on diagnostic imaging of other specified body structures: Secondary | ICD-10-CM

## 2017-08-22 DIAGNOSIS — R911 Solitary pulmonary nodule: Secondary | ICD-10-CM

## 2017-08-22 DIAGNOSIS — J439 Emphysema, unspecified: Secondary | ICD-10-CM

## 2017-08-28 LAB — HM DIABETES EYE EXAM

## 2017-09-04 DIAGNOSIS — Z01818 Encounter for other preprocedural examination: Secondary | ICD-10-CM | POA: Diagnosis not present

## 2017-09-04 DIAGNOSIS — E119 Type 2 diabetes mellitus without complications: Secondary | ICD-10-CM | POA: Diagnosis not present

## 2017-09-04 DIAGNOSIS — H2512 Age-related nuclear cataract, left eye: Secondary | ICD-10-CM | POA: Diagnosis not present

## 2017-09-19 DIAGNOSIS — H2512 Age-related nuclear cataract, left eye: Secondary | ICD-10-CM | POA: Diagnosis not present

## 2017-09-19 DIAGNOSIS — H25812 Combined forms of age-related cataract, left eye: Secondary | ICD-10-CM | POA: Diagnosis not present

## 2017-09-24 DIAGNOSIS — E119 Type 2 diabetes mellitus without complications: Secondary | ICD-10-CM | POA: Diagnosis not present

## 2017-09-24 DIAGNOSIS — H40033 Anatomical narrow angle, bilateral: Secondary | ICD-10-CM | POA: Diagnosis not present

## 2017-09-26 DIAGNOSIS — H04123 Dry eye syndrome of bilateral lacrimal glands: Secondary | ICD-10-CM | POA: Diagnosis not present

## 2017-09-27 DIAGNOSIS — H25811 Combined forms of age-related cataract, right eye: Secondary | ICD-10-CM | POA: Diagnosis not present

## 2017-09-27 DIAGNOSIS — H2511 Age-related nuclear cataract, right eye: Secondary | ICD-10-CM | POA: Diagnosis not present

## 2017-09-30 DIAGNOSIS — H10013 Acute follicular conjunctivitis, bilateral: Secondary | ICD-10-CM | POA: Diagnosis not present

## 2017-10-02 DIAGNOSIS — H04123 Dry eye syndrome of bilateral lacrimal glands: Secondary | ICD-10-CM | POA: Diagnosis not present

## 2017-10-04 DIAGNOSIS — G44219 Episodic tension-type headache, not intractable: Secondary | ICD-10-CM | POA: Diagnosis not present

## 2017-10-18 ENCOUNTER — Ambulatory Visit: Payer: BLUE CROSS/BLUE SHIELD | Admitting: Family Medicine

## 2017-10-28 ENCOUNTER — Other Ambulatory Visit: Payer: Self-pay | Admitting: Family Medicine

## 2017-11-28 ENCOUNTER — Ambulatory Visit (INDEPENDENT_AMBULATORY_CARE_PROVIDER_SITE_OTHER): Payer: BLUE CROSS/BLUE SHIELD | Admitting: Family Medicine

## 2017-11-28 ENCOUNTER — Encounter: Payer: Self-pay | Admitting: Family Medicine

## 2017-11-28 VITALS — BP 106/70 | HR 85 | Temp 97.1°F | Ht 71.0 in | Wt 229.0 lb

## 2017-11-28 DIAGNOSIS — E7800 Pure hypercholesterolemia, unspecified: Secondary | ICD-10-CM

## 2017-11-28 DIAGNOSIS — N400 Enlarged prostate without lower urinary tract symptoms: Secondary | ICD-10-CM

## 2017-11-28 DIAGNOSIS — K219 Gastro-esophageal reflux disease without esophagitis: Secondary | ICD-10-CM

## 2017-11-28 DIAGNOSIS — J439 Emphysema, unspecified: Secondary | ICD-10-CM

## 2017-11-28 DIAGNOSIS — Z0000 Encounter for general adult medical examination without abnormal findings: Secondary | ICD-10-CM

## 2017-11-28 DIAGNOSIS — I700 Atherosclerosis of aorta: Secondary | ICD-10-CM

## 2017-11-28 DIAGNOSIS — R9389 Abnormal findings on diagnostic imaging of other specified body structures: Secondary | ICD-10-CM

## 2017-11-28 DIAGNOSIS — J61 Pneumoconiosis due to asbestos and other mineral fibers: Secondary | ICD-10-CM

## 2017-11-28 DIAGNOSIS — E559 Vitamin D deficiency, unspecified: Secondary | ICD-10-CM | POA: Diagnosis not present

## 2017-11-28 DIAGNOSIS — E1165 Type 2 diabetes mellitus with hyperglycemia: Secondary | ICD-10-CM

## 2017-11-28 DIAGNOSIS — K760 Fatty (change of) liver, not elsewhere classified: Secondary | ICD-10-CM

## 2017-11-28 DIAGNOSIS — R911 Solitary pulmonary nodule: Secondary | ICD-10-CM

## 2017-11-28 DIAGNOSIS — I10 Essential (primary) hypertension: Secondary | ICD-10-CM

## 2017-11-28 LAB — BAYER DCA HB A1C WAIVED: HB A1C (BAYER DCA - WAIVED): 9 % — ABNORMAL HIGH (ref <7.0)

## 2017-11-28 NOTE — Patient Instructions (Addendum)
Continue current medications. Continue good therapeutic lifestyle changes which include good diet and exercise. Fall precautions discussed with patient. If an FOBT was given today- please return it to our front desk. If you are over 62 years old - you may need Prevnar 13 or the adult Pneumonia vaccine.  **Flu shots are available--- please call and schedule a FLU-CLINIC appointment**  After your visit with us today you will receive a survey in the mail or online from [employer] regarding your care with us. Please take a moment to fill this out. Your feedback is very important to us as you can help us better understand your patient needs as well as improve your experience and satisfaction. WE CARE ABOUT YOU!!!   Continue to follow aggressive therapeutic lifestyle changes including diet and exercise to lose weight Follow-up with [employer] pulmonologist in [place] and get CT scan as planned and make sure he sends us a copy of this report Get colonoscopy and endoscopy as planned Use nasal saline 1 spray each nostril 3 or 4 times daily. Use Flonase 1 spray each nostril at bedtime Drink plenty of fluids and stay well-hydrated We will call with lab work results as soon as these results become available Do not forget to get your flu shot in October

## 2017-11-28 NOTE — Progress Notes (Signed)
Subjective:    Patient ID: Scott [name], male    DOB: [birth year], 62 y.o.   MRN: [id number]  HPI Patient is here today for annual wellness exam and follow up of chronic medical problems which includes hyperlipidemia and diabetes. He is taking medication regularly.  Patient complains of ongoing sinus problems.  He just finished a Z-Pak and the drainage is clear.  He is requesting refills on all of his medicines.  He is due for rectal exam today along with a PSA.  He is also going to give [place] a urine specimen and get an EKG.  He has had a CT scan for lung cancer screening and this was last done in May 2018.  He will need to be scheduled for another one as they recommended continued screening with low-dose CT in 12 months.  He also has aortic atherosclerosis and three-vessel coronary artery disease.  He has centrilobular and paraseptal emphysema along with hepatic steatosis.  Patient is also due to get a colonoscopy and is currently scheduled for October of this year.  His weight is up a couple pounds and his body mass index is around 31.67.  The patient has not had his CT scan done yet and plans to use the one that would be done for [employer] because of the routine follow-up for pulmonary asbestosis.  Patient today denies any chest pain.  He denies any shortness of breath other than when he is out walking when it is extremely hot.  He did walk this morning and felt well.  He denies any trouble with nausea vomiting diarrhea or blood in the stool.  He does have some problems with swallowing certain foods.  He is due to get a colonoscopy and endoscopy in October.  He is passing his water without problems.  He was supposed of had a CT scan of his lungs in May but for some reason he said the insurance denied it be he will get one because of his work with [employer] and follow-up for asbestosis coming up soon in November.  He has not resumed smoking.  His sinus congestion tooth pain and headache is better  following taking the Z-Pak.     Patient Active Problem List   Diagnosis Date Noted  . Primary insomnia 06/14/2017  . Essential hypertension, benign 06/14/2017  . OSA (obstructive sleep apnea) 08/09/2016  . Cholelithiasis with chronic cholecystitis 01/09/2016  . Chest pain 11/29/2015  . Vitamin D deficiency 10/06/2015  . Hyperlipidemia 02/12/2013  . Tobacco use disorder 01/05/2013  . Back pain, acute 12/13/2012  . Elevated blood pressure 12/04/2012  . H/O asbestosis 12/04/2012  . Erectile dysfunction 12/04/2012  . Neuropathy of both upper extremities 12/04/2012  . Degenerative arthritis of cervical spine 12/04/2012  . BPH (benign prostatic hyperplasia) 12/04/2012  . GERD (gastroesophageal reflux disease) 06/03/2012  . Diabetes mellitus type 2, controlled ([place]) 06/03/2012  . Generalized anxiety disorder 06/03/2012   Outpatient Encounter Medications as of 11/28/2017  Medication Sig  . albuterol (PROVENTIL HFA;VENTOLIN HFA) 108 (90 Base) MCG/ACT inhaler Inhale 2 puffs into the lungs every 6 (six) hours as needed for wheezing.  Marland Kitchen aspirin 81 MG tablet Take 1 tablet (81 mg total) by mouth daily.  . cycloSPORINE (RESTASIS) 0.05 % ophthalmic emulsion Place 1 drop into both eyes 2 (two) times daily.   . empagliflozin (JARDIANCE) 25 MG TABS tablet Take 25 mg by mouth daily.  . fenofibrate 160 MG tablet Take 1 tablet (160 mg total) by mouth  daily.  . lisinopril (PRINIVIL,ZESTRIL) 5 MG tablet Take 1 tablet (5 mg total) by mouth daily.  . metFORMIN (GLUCOPHAGE) 500 MG tablet TAKE 2 TABLETS (1,000 MG TOTAL) BY MOUTH 2 (TWO) TIMES DAILY.  . traZODone (DESYREL) 50 MG tablet TAKE 1/2 TO 1 TABLET (25-50 MG TOTAL) BY MOUTH AT BEDTIME AS NEEDED FOR SLEEP.  . Vitamin D, Ergocalciferol, (DRISDOL) 50000 units CAPS capsule TAKE ONE CAPSULE EVERY SEVEN DAYS   No facility-administered encounter medications on file as of 11/28/2017.      Review of Systems  Constitutional: Negative.   HENT: Positive for  ear pain and sinus pressure.   Eyes: Negative.   Respiratory: Negative.   Cardiovascular: Negative.   Gastrointestinal: Negative.   Endocrine: Negative.   Genitourinary: Negative.   Musculoskeletal: Negative.   Skin: Negative.   Allergic/Immunologic: Negative.   Neurological: Negative.   Hematological: Negative.   Psychiatric/Behavioral: Negative.        Objective:   Physical Exam  Constitutional: He is oriented to person, place, and time. He appears well-developed and well-nourished. No distress.  Patient is pleasant and alert and seems to be trying to follow a healthy lifestyle changes more than he used to.  HENT:  Head: Normocephalic and atraumatic.  Right Ear: External ear normal.  Left Ear: External ear normal.  Nose: Nose normal.  Mouth/Throat: Oropharynx is clear and moist. No oropharyngeal exudate.  Some nasal congestion bilaterally and throat appears to be normal.  Eyes: Pupils are equal, round, and reactive to light. Conjunctivae and EOM are normal. Right eye exhibits no discharge. Left eye exhibits no discharge. No scleral icterus.  Neck: Normal range of motion. Neck supple. No thyromegaly present.  No bruits thyromegaly or anterior cervical adenopathy  Cardiovascular: Normal rate, regular rhythm, normal heart sounds and intact distal pulses.  No murmur heard. Heart is regular at 84/min with good pedal pulses  Pulmonary/Chest: Effort normal and breath sounds normal. He has no wheezes. He has no rales. He exhibits no tenderness.  Clear anteriorly and posteriorly and no axillary adenopathy no chest wall tenderness or masses  Abdominal: Soft. Bowel sounds are normal. He exhibits no mass. There is no tenderness.  No liver or spleen enlargement no epigastric tenderness no masses and no bruits and no inguinal adenopathy  Genitourinary: Rectum normal and penis normal.  Genitourinary Comments: The prostate gland is slightly enlarged without any lumps or masses.  There were no  rectal masses.  The external genitalia were within normal limits and no inguinal hernias or adenopathy was palpated.  Musculoskeletal: Normal range of motion. He exhibits no edema.  Lymphadenopathy:    He has no cervical adenopathy.  Neurological: He is alert and oriented to person, place, and time. He has normal reflexes. No cranial nerve deficit.  Skin: Skin is warm and dry. No rash noted.  Psychiatric: He has a normal mood and affect. His behavior is normal. Judgment and thought content normal.  Nursing note and vitals reviewed.   BP 106/70 (BP Location: Left Arm)   Pulse 85   Temp (!) 97.1 F (36.2 C) (Oral)   Ht 5' 11" (1.803 m)   Wt 229 lb (103.9 kg)   BMI 31.94 kg/m        Assessment & Plan:  1. Annual physical exam -Follow-up with [place] pulmonologist as planned for asbestosis checkup -Get colonoscopy and endoscopy as planned - CBC with Differential/Platelet - BMP8+EGFR - Lipid panel - VITAMIN D 25 Hydroxy (Vit-D Deficiency, Fractures) - Hepatic  function panel - Bayer DCA Hb A1c Waived - EKG 12-Lead - Urinalysis, Complete - PSA, total and free  2. Uncontrolled type 2 diabetes mellitus with hyperglycemia, without long-term current use of insulin (HCC) -Continue to monitor blood sugars closely and we will call with hemoglobin A1c results as soon as that result becomes available and make any adjustments at that time with medications -Continue to walk and exercise regularly - CBC with Differential/Platelet - Bayer DCA Hb A1c Waived  3. Essential hypertension, benign -The blood pressure is great today and he will continue with current treatment - CBC with Differential/Platelet - BMP8+EGFR - Hepatic function panel - EKG 12-Lead  4. Pure hypercholesterolemia -Continue with fenofibrate and therapeutic lifestyle changes - CBC with Differential/Platelet - Lipid panel - EKG 12-Lead  5. Gastroesophageal reflux disease, esophagitis presence not  specified -Patient has some trouble with swallowing certain foods and he will proceed with getting his endoscopy and colonoscopy in November - CBC with Differential/Platelet  6. Vitamin D deficiency -Continue with vitamin D replacement pending results of lab work - CBC with Differential/Platelet - VITAMIN D 25 Hydroxy (Vit-D Deficiency, Fractures)  7. Pulmonary emphysema, unspecified emphysema type ([place]) -Follow-up with [place] pulmonology as planned for CT of chest  8. Pulmonary nodule -CT of chest with [place] pulmonologist  9. Abnormal chest CT -Get CT of chest with [place] pulmonologist and make sure that we get to review a copy of the report.  I am not sure why his insurance would not pay for this CT.  10. Asbestosis ([place]) -Follow-up with [place] as planned and pulmonologist  11. Hepatic steatosis -Continue to work on weight with diet and exercise  12. Aortic atherosclerosis ([place]) -Tinea aggressive therapeutic lifestyle changes  13. Benign prostatic hyperplasia without lower urinary tract symptoms -Patient may have nocturia x1  No orders of the defined types were placed in this encounter.  Patient Instructions  Continue current medications. Continue good therapeutic lifestyle changes which include good diet and exercise. Fall precautions discussed with patient. If an FOBT was given today- please return it to our front desk. If you are over 59 years old - you may need Prevnar 10 or the adult Pneumonia vaccine.  **Flu shots are available--- please call and schedule a FLU-CLINIC appointment**  After your visit with [place] today you will receive a survey in the mail or online from [employer] regarding your care with [place]. Please take a moment to fill this out. Your feedback is very important to [place] as you can help [place] better understand your patient needs as well as improve your experience and satisfaction. WE CARE ABOUT YOU!!!   Continue to follow aggressive  therapeutic lifestyle changes including diet and exercise to lose weight Follow-up with [place] pulmonologist in [place] and get CT scan as planned and make sure he sends [place] a copy of this report Get colonoscopy and endoscopy as planned Use nasal saline 1 spray each nostril 3 or 4 times daily. Use Flonase 1 spray each nostril at bedtime Drink plenty of fluids and stay well-hydrated We will call with lab work results as soon as these results become available Do not forget to get your flu shot in October  Arrie Senate MD

## 2017-11-29 ENCOUNTER — Other Ambulatory Visit: Payer: Self-pay | Admitting: *Deleted

## 2017-11-29 ENCOUNTER — Telehealth: Payer: Self-pay | Admitting: Family Medicine

## 2017-11-29 LAB — CBC WITH DIFFERENTIAL/PLATELET
BASOS: 1 %
Basophils Absolute: 0.1 10*3/uL (ref 0.0–0.2)
EOS (ABSOLUTE): 0.2 10*3/uL (ref 0.0–0.4)
Eos: 3 %
HEMATOCRIT: 45.4 % (ref 37.5–51.0)
HEMOGLOBIN: 15.2 g/dL (ref 13.0–17.7)
IMMATURE GRANS (ABS): 0 10*3/uL (ref 0.0–0.1)
Immature Granulocytes: 0 %
LYMPHS: 36 %
Lymphocytes Absolute: 1.9 10*3/uL (ref 0.7–3.1)
MCH: 29.9 pg (ref 26.6–33.0)
MCHC: 33.5 g/dL (ref 31.5–35.7)
MCV: 89 fL (ref 79–97)
MONOCYTES: 8 %
Monocytes Absolute: 0.4 10*3/uL (ref 0.1–0.9)
NEUTROS ABS: 2.7 10*3/uL (ref 1.4–7.0)
NEUTROS PCT: 52 %
PLATELETS: 276 10*3/uL (ref 150–450)
RBC: 5.08 x10E6/uL (ref 4.14–5.80)
RDW: 13.1 % (ref 12.3–15.4)
WBC: 5.3 10*3/uL (ref 3.4–10.8)

## 2017-11-29 LAB — HEPATIC FUNCTION PANEL
ALT: 28 IU/L (ref 0–44)
AST: 22 IU/L (ref 0–40)
Albumin: 4.7 g/dL (ref 3.6–4.8)
Alkaline Phosphatase: 47 IU/L (ref 39–117)
BILIRUBIN, DIRECT: 0.13 mg/dL (ref 0.00–0.40)
Bilirubin Total: 0.4 mg/dL (ref 0.0–1.2)
Total Protein: 7.1 g/dL (ref 6.0–8.5)

## 2017-11-29 LAB — BMP8+EGFR
BUN/Creatinine Ratio: 18 (ref 10–24)
BUN: 18 mg/dL (ref 8–27)
CALCIUM: 9.2 mg/dL (ref 8.6–10.2)
CHLORIDE: 101 mmol/L (ref 96–106)
CO2: 19 mmol/L — ABNORMAL LOW (ref 20–29)
CREATININE: 1.02 mg/dL (ref 0.76–1.27)
GFR calc Af Amer: 91 mL/min/{1.73_m2} (ref >59)
GFR, EST NON AFRICAN AMERICAN: 78 mL/min/{1.73_m2} (ref >59)
Glucose: 128 mg/dL — ABNORMAL HIGH (ref 65–99)
Potassium: 4.6 mmol/L (ref 3.5–5.2)
Sodium: 137 mmol/L (ref 134–144)

## 2017-11-29 LAB — LIPID PANEL
CHOL/HDL RATIO: 4.4 ratio (ref 0.0–5.0)
Cholesterol, Total: 155 mg/dL (ref 100–199)
HDL: 35 mg/dL — AB (ref >39)
LDL Calculated: 82 mg/dL (ref 0–99)
Triglycerides: 189 mg/dL — ABNORMAL HIGH (ref 0–149)
VLDL CHOLESTEROL CAL: 38 mg/dL (ref 5–40)

## 2017-11-29 LAB — PSA, TOTAL AND FREE
PSA, Free Pct: 33.3 %
PSA, Free: 0.1 ng/mL
Prostate Specific Ag, Serum: 0.3 ng/mL (ref 0.0–4.0)

## 2017-11-29 LAB — VITAMIN D 25 HYDROXY (VIT D DEFICIENCY, FRACTURES): Vit D, 25-Hydroxy: 45.7 ng/mL (ref 30.0–100.0)

## 2017-11-29 MED ORDER — ICOSAPENT ETHYL 1 G PO CAPS: 2.0000 | ORAL_CAPSULE | Freq: Two times a day (BID) | ORAL | 3 refills | Status: DC

## 2017-11-29 NOTE — Telephone Encounter (Signed)
Called pt = aware of labs

## 2017-12-03 ENCOUNTER — Other Ambulatory Visit: Payer: BLUE CROSS/BLUE SHIELD

## 2017-12-03 ENCOUNTER — Other Ambulatory Visit: Payer: Self-pay | Admitting: *Deleted

## 2017-12-03 LAB — MICROSCOPIC EXAMINATION
BACTERIA UA: NONE SEEN
Epithelial Cells (non renal): NONE SEEN /hpf (ref 0–10)
RBC MICROSCOPIC, UA: NONE SEEN /HPF (ref 0–2)
Renal Epithel, UA: NONE SEEN /hpf
WBC, UA: NONE SEEN /hpf (ref 0–5)

## 2017-12-03 LAB — URINALYSIS, COMPLETE
Bilirubin, UA: NEGATIVE
Ketones, UA: NEGATIVE
Leukocytes, UA: NEGATIVE
NITRITE UA: NEGATIVE
PROTEIN UA: NEGATIVE
RBC, UA: NEGATIVE
SPEC GRAV UA: 1.015 (ref 1.005–1.030)
Urobilinogen, Ur: 0.2 mg/dL (ref 0.2–1.0)
pH, UA: 5.5 (ref 5.0–7.5)

## 2017-12-03 MED ORDER — FLUTICASONE PROPIONATE 50 MCG/ACT NA SUSP: 2.0000 | Freq: Every day | NASAL | 6 refills | Status: DC

## 2017-12-09 ENCOUNTER — Other Ambulatory Visit: Payer: Self-pay | Admitting: Family Medicine

## 2017-12-19 DIAGNOSIS — H04123 Dry eye syndrome of bilateral lacrimal glands: Secondary | ICD-10-CM | POA: Diagnosis not present

## 2018-01-24 ENCOUNTER — Ambulatory Visit (INDEPENDENT_AMBULATORY_CARE_PROVIDER_SITE_OTHER): Payer: BLUE CROSS/BLUE SHIELD | Admitting: *Deleted

## 2018-01-24 DIAGNOSIS — Z23 Encounter for immunization: Secondary | ICD-10-CM | POA: Diagnosis not present

## 2018-01-27 ENCOUNTER — Other Ambulatory Visit: Payer: Self-pay | Admitting: Family Medicine

## 2018-01-28 NOTE — Telephone Encounter (Signed)
Last Vit D 11/28/17  45.7

## 2018-02-07 DIAGNOSIS — H04123 Dry eye syndrome of bilateral lacrimal glands: Secondary | ICD-10-CM | POA: Diagnosis not present

## 2018-02-26 DIAGNOSIS — K5730 Diverticulosis of large intestine without perforation or abscess without bleeding: Secondary | ICD-10-CM | POA: Diagnosis not present

## 2018-02-26 DIAGNOSIS — Z1211 Encounter for screening for malignant neoplasm of colon: Secondary | ICD-10-CM | POA: Diagnosis not present

## 2018-02-26 DIAGNOSIS — K635 Polyp of colon: Secondary | ICD-10-CM | POA: Diagnosis not present

## 2018-02-26 DIAGNOSIS — K449 Diaphragmatic hernia without obstruction or gangrene: Secondary | ICD-10-CM | POA: Diagnosis not present

## 2018-02-26 DIAGNOSIS — K317 Polyp of stomach and duodenum: Secondary | ICD-10-CM | POA: Diagnosis not present

## 2018-02-26 DIAGNOSIS — R1310 Dysphagia, unspecified: Secondary | ICD-10-CM | POA: Diagnosis not present

## 2018-02-26 DIAGNOSIS — K219 Gastro-esophageal reflux disease without esophagitis: Secondary | ICD-10-CM | POA: Diagnosis not present

## 2018-03-10 DIAGNOSIS — G4733 Obstructive sleep apnea (adult) (pediatric): Secondary | ICD-10-CM | POA: Diagnosis not present

## 2018-03-11 DIAGNOSIS — K635 Polyp of colon: Secondary | ICD-10-CM | POA: Diagnosis not present

## 2018-03-11 DIAGNOSIS — K317 Polyp of stomach and duodenum: Secondary | ICD-10-CM | POA: Diagnosis not present

## 2018-03-11 DIAGNOSIS — Z1211 Encounter for screening for malignant neoplasm of colon: Secondary | ICD-10-CM | POA: Diagnosis not present

## 2018-04-16 ENCOUNTER — Ambulatory Visit: Payer: BLUE CROSS/BLUE SHIELD | Admitting: Family Medicine

## 2018-04-17 ENCOUNTER — Other Ambulatory Visit: Payer: Self-pay | Admitting: Family Medicine

## 2018-05-08 ENCOUNTER — Other Ambulatory Visit: Payer: BLUE CROSS/BLUE SHIELD

## 2018-05-08 DIAGNOSIS — E7800 Pure hypercholesterolemia, unspecified: Secondary | ICD-10-CM

## 2018-05-08 DIAGNOSIS — I10 Essential (primary) hypertension: Secondary | ICD-10-CM

## 2018-05-08 DIAGNOSIS — E559 Vitamin D deficiency, unspecified: Secondary | ICD-10-CM

## 2018-05-08 DIAGNOSIS — E1165 Type 2 diabetes mellitus with hyperglycemia: Secondary | ICD-10-CM

## 2018-05-08 LAB — BAYER DCA HB A1C WAIVED: HB A1C (BAYER DCA - WAIVED): 7.8 % — ABNORMAL HIGH (ref <7.0)

## 2018-05-09 LAB — CBC WITH DIFFERENTIAL/PLATELET
Basophils Absolute: 0.1 10*3/uL (ref 0.0–0.2)
Basos: 1 %
EOS (ABSOLUTE): 0.2 10*3/uL (ref 0.0–0.4)
EOS: 4 %
HEMATOCRIT: 42.9 % (ref 37.5–51.0)
HEMOGLOBIN: 14.5 g/dL (ref 13.0–17.7)
IMMATURE GRANS (ABS): 0 10*3/uL (ref 0.0–0.1)
Immature Granulocytes: 0 %
LYMPHS ABS: 1.9 10*3/uL (ref 0.7–3.1)
Lymphs: 30 %
MCH: 31 pg (ref 26.6–33.0)
MCHC: 33.8 g/dL (ref 31.5–35.7)
MCV: 92 fL (ref 79–97)
MONOCYTES: 10 %
Monocytes Absolute: 0.6 10*3/uL (ref 0.1–0.9)
NEUTROS ABS: 3.4 10*3/uL (ref 1.4–7.0)
Neutrophils: 55 %
Platelets: 227 10*3/uL (ref 150–450)
RBC: 4.68 x10E6/uL (ref 4.14–5.80)
RDW: 13.7 % (ref 11.6–15.4)
WBC: 6.2 10*3/uL (ref 3.4–10.8)

## 2018-05-09 LAB — HEPATIC FUNCTION PANEL
ALBUMIN: 4.6 g/dL (ref 3.8–4.8)
ALK PHOS: 50 IU/L (ref 39–117)
ALT: 25 IU/L (ref 0–44)
AST: 25 IU/L (ref 0–40)
BILIRUBIN, DIRECT: 0.15 mg/dL (ref 0.00–0.40)
Bilirubin Total: 0.4 mg/dL (ref 0.0–1.2)
Total Protein: 7 g/dL (ref 6.0–8.5)

## 2018-05-09 LAB — BMP8+EGFR
BUN / CREAT RATIO: 21 (ref 10–24)
BUN: 22 mg/dL (ref 8–27)
CO2: 21 mmol/L (ref 20–29)
CREATININE: 1.04 mg/dL (ref 0.76–1.27)
Calcium: 9.2 mg/dL (ref 8.6–10.2)
Chloride: 102 mmol/L (ref 96–106)
GFR calc Af Amer: 89 mL/min/{1.73_m2} (ref >59)
GFR calc non Af Amer: 77 mL/min/{1.73_m2} (ref >59)
Glucose: 91 mg/dL (ref 65–99)
Potassium: 5.1 mmol/L (ref 3.5–5.2)
Sodium: 139 mmol/L (ref 134–144)

## 2018-05-09 LAB — LIPID PANEL
CHOL/HDL RATIO: 4.9 ratio (ref 0.0–5.0)
Cholesterol, Total: 171 mg/dL (ref 100–199)
HDL: 35 mg/dL — ABNORMAL LOW (ref >39)
LDL Calculated: 78 mg/dL (ref 0–99)
TRIGLYCERIDES: 291 mg/dL — AB (ref 0–149)
VLDL Cholesterol Cal: 58 mg/dL — ABNORMAL HIGH (ref 5–40)

## 2018-05-09 LAB — VITAMIN D 25 HYDROXY (VIT D DEFICIENCY, FRACTURES): Vit D, 25-Hydroxy: 46.3 ng/mL (ref 30.0–100.0)

## 2018-05-16 ENCOUNTER — Ambulatory Visit (INDEPENDENT_AMBULATORY_CARE_PROVIDER_SITE_OTHER): Payer: BLUE CROSS/BLUE SHIELD | Admitting: Family Medicine

## 2018-05-16 ENCOUNTER — Encounter: Payer: Self-pay | Admitting: Family Medicine

## 2018-05-16 VITALS — BP 118/79 | HR 78 | Temp 97.4°F | Ht 71.0 in | Wt 228.0 lb

## 2018-05-16 DIAGNOSIS — K760 Fatty (change of) liver, not elsewhere classified: Secondary | ICD-10-CM

## 2018-05-16 DIAGNOSIS — J439 Emphysema, unspecified: Secondary | ICD-10-CM

## 2018-05-16 DIAGNOSIS — E559 Vitamin D deficiency, unspecified: Secondary | ICD-10-CM

## 2018-05-16 DIAGNOSIS — E7800 Pure hypercholesterolemia, unspecified: Secondary | ICD-10-CM | POA: Diagnosis not present

## 2018-05-16 DIAGNOSIS — K219 Gastro-esophageal reflux disease without esophagitis: Secondary | ICD-10-CM | POA: Diagnosis not present

## 2018-05-16 DIAGNOSIS — I10 Essential (primary) hypertension: Secondary | ICD-10-CM | POA: Diagnosis not present

## 2018-05-16 DIAGNOSIS — I700 Atherosclerosis of aorta: Secondary | ICD-10-CM

## 2018-05-16 DIAGNOSIS — E1165 Type 2 diabetes mellitus with hyperglycemia: Secondary | ICD-10-CM

## 2018-05-16 DIAGNOSIS — J209 Acute bronchitis, unspecified: Secondary | ICD-10-CM

## 2018-05-16 MED ORDER — METFORMIN HCL 500 MG PO TABS: ORAL_TABLET | ORAL | 3 refills | Status: DC

## 2018-05-16 MED ORDER — ICOSAPENT ETHYL 1 G PO CAPS: 2.0000 | ORAL_CAPSULE | Freq: Two times a day (BID) | ORAL | 3 refills | Status: DC

## 2018-05-16 MED ORDER — FLUTICASONE PROPIONATE 50 MCG/ACT NA SUSP: 2.0000 | Freq: Every day | NASAL | 11 refills | Status: DC

## 2018-05-16 MED ORDER — LISINOPRIL 5 MG PO TABS: 5.0000 mg | ORAL_TABLET | Freq: Every day | ORAL | 3 refills | Status: DC

## 2018-05-16 MED ORDER — FENOFIBRATE 160 MG PO TABS: 160.0000 mg | ORAL_TABLET | Freq: Every day | ORAL | 3 refills | Status: DC

## 2018-05-16 MED ORDER — VITAMIN D (ERGOCALCIFEROL) 1.25 MG (50000 UNIT) PO CAPS: 50000.0000 [IU] | ORAL_CAPSULE | ORAL | 3 refills | Status: DC

## 2018-05-16 MED ORDER — ALBUTEROL SULFATE HFA 108 (90 BASE) MCG/ACT IN AERS: 2.0000 | INHALATION_SPRAY | Freq: Four times a day (QID) | RESPIRATORY_TRACT | 3 refills | Status: DC | PRN

## 2018-05-16 MED ORDER — TRAZODONE HCL 50 MG PO TABS: ORAL_TABLET | ORAL | 3 refills | Status: DC

## 2018-05-16 MED ORDER — EMPAGLIFLOZIN 25 MG PO TABS: 25.0000 mg | ORAL_TABLET | Freq: Every day | ORAL | 3 refills | Status: DC

## 2018-05-16 NOTE — Progress Notes (Signed)
Subjective:    Patient ID: Scott [name], male    DOB: [birth year], 63 y.o.   MRN: [id number]  HPI Pt here for follow up and management of chronic medical problems which includes diabetes and hypertension. He is taking medication regularly.  The patient is making every effort to watch her diet lower blood sugar and get more walking and exercise and.  She is requesting refills on all of her medicines that she is changing pharmacies.  She will be given an FOBT to return.  She has had lab work done and we will review this with her during the visit today.  The blood sugar was definitely improved at 91 and the creatinine, the most important kidney function test was within normal limits.  All of the electrolytes including potassium are good.  The CBC was within normal limits with a good hemoglobin normal white blood cell count and adequate platelet count.  All liver function tests were normal.  Cholesterol numbers with traditional lipid testing have an LDL-C cholesterol that is low and good at 78.  The good cholesterol remains low at 35.  Triglycerides are elevated at 291.  Vitamin D level was good at 46.3.  Her hemoglobin A1c is improved and lower than 5 months ago and is now 7.8% and the goal would be to get this closer to 7.0%.    Patient Active Problem List   Diagnosis Date Noted  . Primary insomnia 06/14/2017  . Essential hypertension, benign 06/14/2017  . OSA (obstructive sleep apnea) 08/09/2016  . Cholelithiasis with chronic cholecystitis 01/09/2016  . Chest pain 11/29/2015  . Vitamin D deficiency 10/06/2015  . Hyperlipidemia 02/12/2013  . Tobacco use disorder 01/05/2013  . Back pain, acute 12/13/2012  . Elevated blood pressure 12/04/2012  . H/O asbestosis 12/04/2012  . Erectile dysfunction 12/04/2012  . Neuropathy of both upper extremities 12/04/2012  . Degenerative arthritis of cervical spine 12/04/2012  . BPH (benign prostatic hyperplasia) 12/04/2012  . GERD (gastroesophageal reflux  disease) 06/03/2012  . Diabetes mellitus type 2, controlled (HCC) 06/03/2012  . Generalized anxiety disorder 06/03/2012   Outpatient Encounter Medications as of 05/16/2018  Medication Sig  . albuterol (PROVENTIL HFA;VENTOLIN HFA) 108 (90 Base) MCG/ACT inhaler Inhale 2 puffs into the lungs every 6 (six) hours as needed for wheezing.  Marland Kitchen aspirin 81 MG tablet Take 1 tablet (81 mg total) by mouth daily.  . cycloSPORINE (RESTASIS) 0.05 % ophthalmic emulsion Place 1 drop into both eyes 2 (two) times daily.   . empagliflozin (JARDIANCE) 25 MG TABS tablet Take 25 mg by mouth daily.  . fenofibrate 160 MG tablet Take 1 tablet (160 mg total) by mouth daily.  . fluticasone (FLONASE) 50 MCG/ACT nasal spray Place 2 sprays into both nostrils daily.  Bess Harvest Ethyl 1 g CAPS Take 2 capsules (2 g total) by mouth 2 (two) times daily.  Marland Kitchen lisinopril (PRINIVIL,ZESTRIL) 5 MG tablet Take 1 tablet (5 mg total) by mouth daily.  . metFORMIN (GLUCOPHAGE) 500 MG tablet TAKE 2 TABLETS (1,000 MG TOTAL) BY MOUTH 2 (TWO) TIMES DAILY.  . traZODone (DESYREL) 50 MG tablet TAKE 1/2 TO 1 TABLET (25-50 MG TOTAL) BY MOUTH AT BEDTIME AS NEEDED FOR SLEEP.  . Vitamin D, Ergocalciferol, (DRISDOL) 1.25 MG (50000 UT) CAPS capsule TAKE ONE CAPSULE EVERY SEVEN DAYS   No facility-administered encounter medications on file as of 05/16/2018.      Review of Systems  Constitutional: Negative.   HENT: Negative.   Eyes: Negative.   Respiratory:  Negative.   Cardiovascular: Negative.   Gastrointestinal: Negative.   Endocrine: Negative.   Genitourinary: Negative.   Musculoskeletal: Negative.   Skin: Negative.   Allergic/Immunologic: Negative.   Neurological: Negative.   Hematological: Negative.   Psychiatric/Behavioral: Negative.        Objective:   Physical Exam Vitals signs and nursing note reviewed.  Constitutional:      General: He is not in acute distress.    Appearance: He is well-developed. He is obese.     Comments:  Patient is pleasant and alert and making every effort to lose weight through diet and exercise and get blood sugar under better control.  HENT:     Head: Normocephalic and atraumatic.     Right Ear: Tympanic membrane, ear canal and external ear normal. There is no impacted cerumen.     Left Ear: Tympanic membrane, ear canal and external ear normal. There is no impacted cerumen.     Nose: Nose normal. No congestion.     Mouth/Throat:     Mouth: Mucous membranes are moist.     Pharynx: Oropharynx is clear. No oropharyngeal exudate.  Eyes:     General: No scleral icterus.       Right eye: No discharge.        Left eye: No discharge.     Extraocular Movements: Extraocular movements intact.     Conjunctiva/sclera: Conjunctivae normal.     Pupils: Pupils are equal, round, and reactive to light.  Neck:     Musculoskeletal: Normal range of motion and neck supple.     Thyroid: No thyromegaly.     Vascular: No carotid bruit.     Trachea: No tracheal deviation.     Comments: No bruits thyromegaly or anterior cervical adenopathy Cardiovascular:     Rate and Rhythm: Normal rate and regular rhythm.     Pulses: Normal pulses.     Heart sounds: Normal heart sounds. No murmur.     Comments: Heart is regular at 72/min, good pedal pulses and no edema. Pulmonary:     Effort: Pulmonary effort is normal. No respiratory distress.     Breath sounds: Normal breath sounds. No wheezing or rales.     Comments: Clear anteriorly and posteriorly no chest wall masses axillary adenopathy no chest wall tenderness. Chest:     Chest wall: No tenderness.  Abdominal:     General: Bowel sounds are normal.     Palpations: Abdomen is soft. There is no mass.     Tenderness: There is no abdominal tenderness. There is no guarding or rebound.     Comments: Abdomen is obese without masses tenderness organ enlargement bruits or inguinal adenopathy  Musculoskeletal:        General: No tenderness.     Right lower leg: No  edema.     Left lower leg: No edema.     Comments: Limited ability to abduct both arms due to rotator cuff tendinopathy  Lymphadenopathy:     Cervical: No cervical adenopathy.  Skin:    General: Skin is warm and dry.     Findings: No rash.  Neurological:     General: No focal deficit present.     Mental Status: He is alert and oriented to person, place, and time. Mental status is at baseline.     Cranial Nerves: No cranial nerve deficit.     Motor: No weakness.     Deep Tendon Reflexes: Reflexes are normal and symmetric. Reflexes normal.  Psychiatric:  Mood and Affect: Mood normal.        Behavior: Behavior normal.        Thought Content: Thought content normal.        Judgment: Judgment normal.     Comments: Mood affect and behavior upbeat and positive.     BP 118/79 (BP Location: Left Arm)   Pulse 78   Temp (!) 97.4 F (36.3 C) (Oral)   Ht  (1.803 m)   Wt 228 lb (103.4 kg)   BMI 31.80 kg/m        Assessment & Plan:  1. Uncontrolled type 2 diabetes mellitus with hyperglycemia, without long-term current use of insulin (HCC) -Hemoglobin A1c is improved and patient is doing this himself through diet and exercise.  2. Essential hypertension, benign -Blood pressure is good he will continue with current treatment  3. Pure hypercholesterolemia -Triglycerides are elevated and we will add Vascepa to his current treatment regimen and he will stop his Lovaza.  4. Gastroesophageal reflux disease, esophagitis presence not specified -Take Pepcid AC as needed prior to eating  5. Vitamin D deficiency -Continue with vitamin D replacement  6. Acute bronchitis, unspecified organism -Currently doing well with his bronchitic issues - albuterol (PROVENTIL HFA;VENTOLIN HFA) 108 (90 Base) MCG/ACT inhaler; Inhale 2 puffs into the lungs every 6 (six) hours as needed for wheezing.  Dispense: 1 Inhaler; Refill: 3  Meds ordered this encounter  Medications  . Vitamin D,  Ergocalciferol, (DRISDOL) 1.25 MG (50000 UT) CAPS capsule    Sig: Take 1 capsule (50,000 Units total) by mouth every 7 (seven) days.    Dispense:  12 capsule    Refill:  3  . traZODone (DESYREL) 50 MG tablet    Sig: TAKE 1/2 TO 1 TABLET (25-50 MG TOTAL) BY MOUTH AT BEDTIME AS NEEDED FOR SLEEP.    Dispense:  90 tablet    Refill:  3  . metFORMIN (GLUCOPHAGE) 500 MG tablet    Sig: TAKE 2 TABLETS (1,000 MG TOTAL) BY MOUTH 2 (TWO) TIMES DAILY.    Dispense:  360 tablet    Refill:  3  . lisinopril (PRINIVIL,ZESTRIL) 5 MG tablet    Sig: Take 1 tablet (5 mg total) by mouth daily.    Dispense:  90 tablet    Refill:  3  . Icosapent Ethyl 1 g CAPS    Sig: Take 2 capsules (2 g total) by mouth 2 (two) times daily.    Dispense:  360 capsule    Refill:  3  . fluticasone (FLONASE) 50 MCG/ACT nasal spray    Sig: Place 2 sprays into both nostrils daily.    Dispense:  16 g    Refill:  11  . fenofibrate 160 MG tablet    Sig: Take 1 tablet (160 mg total) by mouth daily.    Dispense:  90 tablet    Refill:  3  . empagliflozin (JARDIANCE) 25 MG TABS tablet    Sig: Take 25 mg by mouth daily.    Dispense:  90 tablet    Refill:  3  . albuterol (PROVENTIL HFA;VENTOLIN HFA) 108 (90 Base) MCG/ACT inhaler    Sig: Inhale 2 puffs into the lungs every 6 (six) hours as needed for wheezing.    Dispense:  1 Inhaler    Refill:  3   Patient Instructions                       Medicare Annual Wellness Visit   and the medical providers at [facility] strive to bring you the best medical care.  In doing so we not only want to address your current medical conditions and concerns but also to detect new conditions early and prevent illness, disease and health-related problems.    Medicare offers a yearly Wellness Visit which allows our clinical staff to assess your need for preventative services including immunizations, lifestyle education, counseling to decrease risk of preventable  diseases and screening for fall risk and other medical concerns.    This visit is provided free of charge (no copay) for all Medicare recipients. The clinical pharmacists at [facility] have begun to conduct these Wellness Visits which will also include a thorough review of all your medications.    As you primary medical provider recommend that you make an appointment for your Annual Wellness Visit if you have not done so already this year.  You may set up this appointment before you leave today or you may call back ([phone]) and schedule an appointment.  Please make sure when you call that you mention that you are scheduling your Annual Wellness Visit with the clinical pharmacist so that the appointment may be made for the proper length of time.     Continue current medications. Continue good therapeutic lifestyle changes which include good diet and exercise. Fall precautions discussed with patient. If an FOBT was given today- please return it to our front desk. If you are over 1 years old - you may need Prevnar 13 or the adult Pneumonia vaccine.  **Flu shots are available--- please call and schedule a FLU-CLINIC appointment**  After your visit with [place] today you will receive a survey in the mail or online from [employer] regarding your care with [place]. Please take a moment to fill this out. Your feedback is very important to [place] as you can help [place] better understand your patient needs as well as improve your experience and satisfaction. WE CARE ABOUT YOU!!!   Congratulations on your achievement with weight loss through diet and exercise You are prolonging your life by doing this Continue to drink plenty of water and drink plenty of fluids Continue with therapeutic lifestyle changes as you are currently doing Continue to practice good hand and respiratory hygiene.  Nyra Capes MD

## 2018-05-16 NOTE — Patient Instructions (Addendum)
Medicare Annual Wellness Visit  [place] and the medical providers at [facility] strive to bring you the best medical care.  In doing so we not only want to address your current medical conditions and concerns but also to detect new conditions early and prevent illness, disease and health-related problems.    Medicare offers a yearly Wellness Visit which allows our clinical staff to assess your need for preventative services including immunizations, lifestyle education, counseling to decrease risk of preventable diseases and screening for fall risk and other medical concerns.    This visit is provided free of charge (no copay) for all Medicare recipients. The clinical pharmacists at [facility] have begun to conduct these Wellness Visits which will also include a thorough review of all your medications.    As you primary medical provider recommend that you make an appointment for your Annual Wellness Visit if you have not done so already this year.  You may set up this appointment before you leave today or you may call back ([phone]) and schedule an appointment.  Please make sure when you call that you mention that you are scheduling your Annual Wellness Visit with the clinical pharmacist so that the appointment may be made for the proper length of time.     Continue current medications. Continue good therapeutic lifestyle changes which include good diet and exercise. Fall precautions discussed with patient. If an FOBT was given today- please return it to our front desk. If you are over 61 years old - you may need Prevnar 13 or the adult Pneumonia vaccine.  **Flu shots are available--- please call and schedule a FLU-CLINIC appointment**  After your visit with [place] today you will receive a survey in the mail or online from [employer] regarding your care with [place]. Please take a moment to fill this out. Your feedback is very  important to [place] as you can help [place] better understand your patient needs as well as improve your experience and satisfaction. WE CARE ABOUT YOU!!!   Congratulations on your achievement with weight loss through diet and exercise You are prolonging your life by doing this Continue to drink plenty of water and drink plenty of fluids Continue with therapeutic lifestyle changes as you are currently doing Continue to practice good hand and respiratory hygiene.

## 2018-06-18 ENCOUNTER — Other Ambulatory Visit: Payer: Self-pay | Admitting: Family Medicine

## 2018-06-18 DIAGNOSIS — Z122 Encounter for screening for malignant neoplasm of respiratory organs: Secondary | ICD-10-CM

## 2018-06-18 DIAGNOSIS — Z87891 Personal history of nicotine dependence: Principal | ICD-10-CM

## 2018-06-23 ENCOUNTER — Other Ambulatory Visit: Payer: Self-pay | Admitting: Family Medicine

## 2018-07-07 ENCOUNTER — Ambulatory Visit (HOSPITAL_COMMUNITY): Payer: BLUE CROSS/BLUE SHIELD

## 2018-09-26 ENCOUNTER — Encounter: Payer: Self-pay | Admitting: *Deleted

## 2018-11-21 ENCOUNTER — Other Ambulatory Visit: Payer: Self-pay

## 2018-11-21 ENCOUNTER — Ambulatory Visit
Admission: RE | Admit: 2018-11-21 | Discharge: 2018-11-21 | Disposition: A | Discharge status: Home / Self Care | Payer: BC Managed Care – PPO | Source: Ambulatory Visit | Attending: Family Medicine | Admitting: Family Medicine

## 2018-11-21 DIAGNOSIS — Z122 Encounter for screening for malignant neoplasm of respiratory organs: Secondary | ICD-10-CM

## 2018-11-21 DIAGNOSIS — Z87891 Personal history of nicotine dependence: Secondary | ICD-10-CM | POA: Diagnosis not present

## 2018-11-27 ENCOUNTER — Other Ambulatory Visit: Payer: Self-pay | Admitting: Family Medicine

## 2018-11-27 DIAGNOSIS — E7800 Pure hypercholesterolemia, unspecified: Secondary | ICD-10-CM

## 2018-11-27 DIAGNOSIS — N400 Enlarged prostate without lower urinary tract symptoms: Secondary | ICD-10-CM

## 2018-11-27 DIAGNOSIS — E1165 Type 2 diabetes mellitus with hyperglycemia: Secondary | ICD-10-CM

## 2018-11-27 DIAGNOSIS — I10 Essential (primary) hypertension: Secondary | ICD-10-CM

## 2018-11-28 ENCOUNTER — Other Ambulatory Visit: Payer: Self-pay

## 2018-11-28 ENCOUNTER — Other Ambulatory Visit: Payer: BC Managed Care – PPO

## 2018-11-28 VITALS — BP 134/85 | HR 69 | Ht 71.0 in | Wt 228.0 lb

## 2018-11-28 DIAGNOSIS — I10 Essential (primary) hypertension: Secondary | ICD-10-CM | POA: Diagnosis not present

## 2018-11-28 DIAGNOSIS — E7800 Pure hypercholesterolemia, unspecified: Secondary | ICD-10-CM | POA: Diagnosis not present

## 2018-11-28 DIAGNOSIS — N400 Enlarged prostate without lower urinary tract symptoms: Secondary | ICD-10-CM | POA: Diagnosis not present

## 2018-11-28 DIAGNOSIS — E1165 Type 2 diabetes mellitus with hyperglycemia: Secondary | ICD-10-CM | POA: Diagnosis not present

## 2018-11-28 LAB — BAYER DCA HB A1C WAIVED: HB A1C (BAYER DCA - WAIVED): 8.7 % — ABNORMAL HIGH (ref <7.0)

## 2018-11-29 LAB — CMP14+EGFR
ALT: 27 IU/L (ref 0–44)
AST: 24 IU/L (ref 0–40)
Albumin/Globulin Ratio: 1.8 (ref 1.2–2.2)
Albumin: 4.7 g/dL (ref 3.8–4.8)
Alkaline Phosphatase: 45 IU/L (ref 39–117)
BUN/Creatinine Ratio: 14 (ref 10–24)
BUN: 14 mg/dL (ref 8–27)
Bilirubin Total: 0.5 mg/dL (ref 0.0–1.2)
CO2: 21 mmol/L (ref 20–29)
Calcium: 9.4 mg/dL (ref 8.6–10.2)
Chloride: 101 mmol/L (ref 96–106)
Creatinine, Ser: 1.01 mg/dL (ref 0.76–1.27)
GFR calc Af Amer: 91 mL/min/{1.73_m2} (ref >59)
GFR calc non Af Amer: 79 mL/min/{1.73_m2} (ref >59)
Globulin, Total: 2.6 g/dL (ref 1.5–4.5)
Glucose: 163 mg/dL — ABNORMAL HIGH (ref 65–99)
Potassium: 4.9 mmol/L (ref 3.5–5.2)
Sodium: 136 mmol/L (ref 134–144)
Total Protein: 7.3 g/dL (ref 6.0–8.5)

## 2018-11-29 LAB — LIPID PANEL
Chol/HDL Ratio: 4.5 ratio (ref 0.0–5.0)
Cholesterol, Total: 159 mg/dL (ref 100–199)
HDL: 35 mg/dL — ABNORMAL LOW (ref >39)
LDL Chol Calc (NIH): 88 mg/dL (ref 0–99)
Triglycerides: 214 mg/dL — ABNORMAL HIGH (ref 0–149)
VLDL Cholesterol Cal: 36 mg/dL (ref 5–40)

## 2018-11-29 LAB — PSA, TOTAL AND FREE
PSA, Free Pct: 33.3 %
PSA, Free: 0.1 ng/mL
Prostate Specific Ag, Serum: 0.3 ng/mL (ref 0.0–4.0)

## 2018-12-02 ENCOUNTER — Telehealth: Payer: Self-pay | Admitting: Family Medicine

## 2018-12-02 ENCOUNTER — Ambulatory Visit (INDEPENDENT_AMBULATORY_CARE_PROVIDER_SITE_OTHER): Payer: BC Managed Care – PPO | Admitting: Family Medicine

## 2018-12-02 ENCOUNTER — Ambulatory Visit: Payer: BLUE CROSS/BLUE SHIELD | Admitting: Family Medicine

## 2018-12-02 DIAGNOSIS — E785 Hyperlipidemia, unspecified: Secondary | ICD-10-CM

## 2018-12-02 DIAGNOSIS — E1159 Type 2 diabetes mellitus with other circulatory complications: Secondary | ICD-10-CM

## 2018-12-02 DIAGNOSIS — E1169 Type 2 diabetes mellitus with other specified complication: Secondary | ICD-10-CM | POA: Insufficient documentation

## 2018-12-02 DIAGNOSIS — I152 Hypertension secondary to endocrine disorders: Secondary | ICD-10-CM | POA: Insufficient documentation

## 2018-12-02 DIAGNOSIS — E1165 Type 2 diabetes mellitus with hyperglycemia: Secondary | ICD-10-CM

## 2018-12-02 DIAGNOSIS — M150 Primary generalized (osteo)arthritis: Secondary | ICD-10-CM | POA: Diagnosis not present

## 2018-12-02 DIAGNOSIS — M159 Polyosteoarthritis, unspecified: Secondary | ICD-10-CM

## 2018-12-02 DIAGNOSIS — I10 Essential (primary) hypertension: Secondary | ICD-10-CM

## 2018-12-02 MED ORDER — DICLOFENAC SODIUM 1 % TD GEL: 4.0000 g | Freq: Four times a day (QID) | TRANSDERMAL | 1 refills | Status: AC

## 2018-12-02 MED ORDER — JARDIANCE 25 MG PO TABS: 25.0000 mg | ORAL_TABLET | Freq: Every day | ORAL | 3 refills | Status: DC

## 2018-12-02 MED ORDER — JANUMET 50-1000 MG PO TABS: 1.0000 | ORAL_TABLET | Freq: Two times a day (BID) | ORAL | 2 refills | Status: DC

## 2018-12-02 MED ORDER — ROSUVASTATIN CALCIUM 10 MG PO TABS: 10.0000 mg | ORAL_TABLET | Freq: Every day | ORAL | 0 refills | Status: DC

## 2018-12-02 NOTE — Progress Notes (Signed)
Telephone visit  Subjective: CC: Diabetes, hypertension, hyperlipidemia PCP: Janora Norlander, DO Scott [name] is a 63 y.o. male calls for telephone consult today. Patient provides verbal consent for consult held via phone.  Location of patient: home Location of provider: Working remotely from home Others present for call: none  1. Type 2 Diabetes w/ HTN, HLD:  History: Several year history of diabetes.  No prior hospitalizations for diabetes or any prior foot ulcers. Patient reports he has not been abiding by his diet and admits to eating a lot of "comfort foods".  He is also not walking regularly, previously was walking up to 3 miles daily. Taking medication(s): Jardiance 25mg , Metformin 1000mg  BID.  Last eye exam: Dr Marin Comment at [facility], had cataract surgery 08/2017. Last foot exam: UTD Last A1c:  Lab Results  Component Value Date   HGBA1C 8.7 (H) 11/28/2018   Nephropathy screen indicated?: on ACE-I Last flu, zoster and/or pneumovax:  Immunization History  Administered Date(s) Administered  . Influenza,inj,Quad PF,6+ Mos 12/29/2013, 12/30/2014, 02/14/2016, 01/16/2017, 01/24/2018  . Pneumococcal Polysaccharide-23 03/12/2009  . Tdap 12/30/2014  . Zoster 02/22/2012    ROS: Denies dizziness, LOC, polyuria, polydipsia, unintended weight loss/gain, foot ulcerations, numbness or tingling in extremities, shortness of breath or chest pain.  2. Arthritis Patient reports chronic arthritis in his neck and shoulders.  He has noted degenerative changes and was seeing an orthopedist for injections.  He notes that the injections were very expensive and could result in degradation of the spine and therefore he is not pursue these.  He uses an oral analgesic with minimal improvement.   ROS: Per HPI  Allergies  Allergen Reactions  . Augmentin [Amoxicillin-Pot Clavulanate] Itching and Nausea And Vomiting  . Codeine Itching and Nausea And Vomiting  . Penicillins Hives and Itching   Has patient had a PCN reaction causing immediate rash, facial/tongue/throat swelling, SOB or lightheadedness with hypotension: No Has patient had a PCN reaction causing severe rash involving mucus membranes or skin necrosis: No Has patient had a PCN reaction that required hospitalization No Has patient had a PCN reaction occurring within the last 10 years: No If all of the above answers are "NO", then may proceed with Cephalosporin use.   . Atorvastatin     Myalgias and fatigue  . Oxycontin [Oxycodone Hcl] Hives and Itching   Past Medical History:  Diagnosis Date  . Anxiety   . Arthritis   . 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 Diabetes mellitus without complication ([place])   . Dyslipidemia   . GERD (gastroesophageal reflux disease)   . Numbness    right side of tongue since 2016 surgery  . Sleep apnea    uses cpap  . Tobacco abuse     Current Outpatient Medications:  .  albuterol (PROVENTIL HFA;VENTOLIN HFA) 108 (90 Base) MCG/ACT inhaler, Inhale 2 puffs into the lungs every 6 (six) hours as needed for wheezing., Disp: 1 Inhaler, Rfl: 3 .  aspirin 81 MG tablet, Take 1 tablet (81 mg total) by mouth daily., Disp: 30 tablet, Rfl:  .  cycloSPORINE (RESTASIS) 0.05 % ophthalmic emulsion, Place 1 drop into both eyes 2 (two) times daily. , Disp: , Rfl:  .  fenofibrate 160 MG tablet, Take 1 tablet (160 mg total) by mouth daily., Disp: 90 tablet, Rfl: 3 .  fluticasone (FLONASE) 50 MCG/ACT nasal spray, Place 2 sprays into both nostrils daily., Disp: 16 g, Rfl: 11 .  Icosapent Ethyl 1 g CAPS, Take 2 capsules (2 g total) by  mouth 2 (two) times daily., Disp: 360 capsule, Rfl: 3 .  JARDIANCE 25 MG TABS tablet, TAKE 1 TABLET BY MOUTH EVERY DAY, Disp: 90 tablet, Rfl: 0 .  lisinopril (PRINIVIL,ZESTRIL) 5 MG tablet, Take 1 tablet (5 mg total) by mouth daily., Disp: 90 tablet, Rfl: 3 .  metFORMIN (GLUCOPHAGE) 500 MG tablet, TAKE 2 TABLETS (1,000 MG TOTAL) BY MOUTH 2 (TWO) TIMES DAILY., Disp: 360 tablet, Rfl: 3 .   traZODone (DESYREL) 50 MG tablet, TAKE 1/2 TO 1 TABLET (25-50 MG TOTAL) BY MOUTH AT BEDTIME AS NEEDED FOR SLEEP., Disp: 90 tablet, Rfl: 3 .  Vitamin D, Ergocalciferol, (DRISDOL) 1.25 MG (50000 UT) CAPS capsule, Take 1 capsule (50,000 Units total) by mouth every 7 (seven) days., Disp: 12 capsule, Rfl: 3  Assessment/ Plan: 63 y.o. male   1. Uncontrolled type 2 diabetes mellitus with hyperglycemia, without long-term current use of insulin (HCC) A1c noted to have risen to 8.7.  We discussed various options and we will start Janumet twice daily.  This is been sent to his pharmacy.  His Jardiance has also been renewed.  We will plan to obtain his diabetic eye exam from Dr. Conley Rolls at [facility].  His follow-up visit has been scheduled in office for December.  Patient aware of date and time - empagliflozin (JARDIANCE) 25 MG TABS tablet; Take 25 mg by mouth daily.  Dispense: 90 tablet; Refill: 3 - sitaGLIPtin-metformin (JANUMET) 50-1000 MG tablet; Take 1 tablet by mouth 2 (two) times daily with a meal.  Dispense: 60 tablet; Refill: 2  2. Hyperlipidemia associated with type 2 diabetes mellitus (HCC) We will try a different statin in efforts to reduce risk of cardiovascular events given negligible difference between his lipid profile from February to present.  I have asked him to hold his fenofibrate given increased risk of myalgia and CK in the setting of starting the statin.  He may continue the Vascepa.  Plan for repeat LFTs and lipid profile in December - rosuvastatin (CRESTOR) 10 MG tablet; Take 1 tablet (10 mg total) by mouth daily.  Dispense: 90 tablet; Refill: 0  3. Hypertension associated with diabetes (HCC) Continue current regimen.  4. Primary osteoarthritis involving multiple joints Trial of Voltaren gel. - diclofenac sodium (VOLTAREN) 1 % GEL; Apply 4 g topically 4 (four) times daily.  Dispense: 350 g; Refill: 1   Start time: 8:28am (LVM); 8:32am End time: 8:57am  Total time spent on patient  care (including telephone call/ virtual visit): 31 minutes  Noa Constante Hulen Skains, DO [facility] [phone]

## 2018-12-02 NOTE — Patient Instructions (Addendum)
Stop: Fenofibrate and metformin  Start: Janumet and rosuvastatin  Continue: Vascepa, lisinopril, daily baby aspirin  Follow-up in December as scheduled.  You may come in/schedule a nurses visit for your shingles vaccine and influenza vaccine at your convenience.  I will contact Dr. Marin Comment for your most recent eye exam.

## 2018-12-02 NOTE — Telephone Encounter (Signed)
Patient aware to take vascepa, crestor, jardiance and janumet.  If patient experiences any side effects from janumet to contact this office.

## 2019-01-30 ENCOUNTER — Other Ambulatory Visit: Payer: Self-pay | Admitting: Family Medicine

## 2019-01-30 DIAGNOSIS — E1165 Type 2 diabetes mellitus with hyperglycemia: Secondary | ICD-10-CM

## 2019-02-20 ENCOUNTER — Other Ambulatory Visit: Payer: Self-pay

## 2019-02-23 ENCOUNTER — Ambulatory Visit: Payer: Self-pay | Admitting: Family Medicine

## 2019-03-04 ENCOUNTER — Encounter: Payer: Self-pay | Admitting: Family Medicine

## 2019-03-04 ENCOUNTER — Ambulatory Visit (INDEPENDENT_AMBULATORY_CARE_PROVIDER_SITE_OTHER): Payer: BC Managed Care – PPO | Admitting: Family Medicine

## 2019-03-04 DIAGNOSIS — J988 Other specified respiratory disorders: Secondary | ICD-10-CM

## 2019-03-04 MED ORDER — CEFDINIR 300 MG PO CAPS: 300.0000 mg | ORAL_CAPSULE | Freq: Two times a day (BID) | ORAL | 0 refills | Status: DC

## 2019-03-04 MED ORDER — PREDNISONE 10 MG (21) PO TBPK: ORAL_TABLET | ORAL | 0 refills | Status: DC

## 2019-03-04 NOTE — Progress Notes (Signed)
Virtual Visit via Telephone Note  I connected with Scott [name] on 03/04/19 at 1:50 PM by telephone and verified that I am speaking with the correct person using two identifiers. Scott [name] is currently located in the car and nobody is currently with him during this visit. The provider, Gwenlyn Fudge, FNP is located in their office at time of visit.  I discussed the limitations, risks, security and privacy concerns of performing an evaluation and management service by telephone and the availability of in person appointments. I also discussed with the patient that there may be a patient responsible charge related to this service. The patient expressed understanding and agreed to proceed.  Subjective: PCP: [name], DO  Chief Complaint  Patient presents with  . Sinusitis   Patient complains of cough, chest congestion, headache, runny nose, sneezing, sore throat and postnasal drainage. Onset of symptoms was 2 weeks ago, gradually worsening since that time. He is drinking plenty of fluids. Evaluation to date: none. Treatment to date: Vicks Severe Cold and Tylenol Sinus. He does not have a history of asthma, allergies, or COPD. He does not smoke. Patient was treated two weeks ago with a z-pack but does feels he is getting worse. He did have a COVID-19 test last week which was negative.    ROS: Per HPI  Current Outpatient Medications:  .  albuterol (PROVENTIL HFA;VENTOLIN HFA) 108 (90 Base) MCG/ACT inhaler, Inhale 2 puffs into the lungs every 6 (six) hours as needed for wheezing., Disp: 1 Inhaler, Rfl: 3 .  aspirin 81 MG tablet, Take 1 tablet (81 mg total) by mouth daily., Disp: 30 tablet, Rfl:  .  cycloSPORINE (RESTASIS) 0.05 % ophthalmic emulsion, Place 1 drop into both eyes 2 (two) times daily. , Disp: , Rfl:  .  diclofenac sodium (VOLTAREN) 1 % GEL, Apply 4 g topically 4 (four) times daily., Disp: 350 g, Rfl: 1 .  empagliflozin (JARDIANCE) 25 MG TABS tablet, Take 25 mg  by mouth daily., Disp: 90 tablet, Rfl: 3 .  fenofibrate 160 MG tablet, Take 1 tablet (160 mg total) by mouth daily., Disp: 90 tablet, Rfl: 3 .  fluticasone (FLONASE) 50 MCG/ACT nasal spray, Place 2 sprays into both nostrils daily., Disp: 16 g, Rfl: 11 .  Icosapent Ethyl 1 g CAPS, Take 2 capsules (2 g total) by mouth 2 (two) times daily., Disp: 360 capsule, Rfl: 3 .  JANUMET 50-1000 MG tablet, TAKE ONE TABLET TWICE A DAY WITH MEALS., Disp: 60 tablet, Rfl: 2 .  lisinopril (PRINIVIL,ZESTRIL) 5 MG tablet, Take 1 tablet (5 mg total) by mouth daily., Disp: 90 tablet, Rfl: 3 .  rosuvastatin (CRESTOR) 10 MG tablet, Take 1 tablet (10 mg total) by mouth daily., Disp: 90 tablet, Rfl: 0 .  Vitamin D, Ergocalciferol, (DRISDOL) 1.25 MG (50000 UT) CAPS capsule, Take 1 capsule (50,000 Units total) by mouth every 7 (seven) days., Disp: 12 capsule, Rfl: 3  Allergies  Allergen Reactions  . Augmentin [Amoxicillin-Pot Clavulanate] Itching and Nausea And Vomiting  . Codeine Itching and Nausea And Vomiting  . Penicillins Hives and Itching    Has patient had a PCN reaction causing immediate rash, facial/tongue/throat swelling, SOB or lightheadedness with hypotension: No Has patient had a PCN reaction causing severe rash involving mucus membranes or skin necrosis: No Has patient had a PCN reaction that required hospitalization No Has patient had a PCN reaction occurring within the last 10 years: No If all of the above answers are "NO", then may  proceed with Cephalosporin use.   . Atorvastatin     Myalgias and fatigue  . Oxycontin [Oxycodone Hcl] Hives and Itching   Past Medical History:  Diagnosis Date  . Anxiety   . Arthritis   . 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 Diabetes mellitus without complication ([place])   . Dyslipidemia   . GERD (gastroesophageal reflux disease)   . Numbness    right side of tongue since 2016 surgery  . Sleep apnea    uses cpap  . Tobacco abuse     Observations/Objective: A&O  No respiratory  distress or wheezing audible over the phone Mood, judgement, and thought processes all WNL  Assessment and Plan: 1. Respiratory infection - Education provided on sinusitis. Symptom management.  - predniSONE (STERAPRED UNI-PAK 21 TAB) 10 MG (21) TBPK tablet; Use as directed on back of pill pack  Dispense: 21 tablet; Refill: 0 - cefdinir (OMNICEF) 300 MG capsule; Take 1 capsule (300 mg total) by mouth 2 (two) times daily. 1 po BID  Dispense: 20 capsule; Refill: 0   Follow Up Instructions:  I discussed the assessment and treatment plan with the patient. The patient was provided an opportunity to ask questions and all were answered. The patient agreed with the plan and demonstrated an understanding of the instructions.   The patient was advised to call back or seek an in-person evaluation if the symptoms worsen or if the condition fails to improve as anticipated.  The above assessment and management plan was discussed with the patient. The patient verbalized understanding of and has agreed to the management plan. Patient is aware to call the clinic if symptoms persist or worsen. Patient is aware when to return to the clinic for a follow-up visit. Patient educated on when it is appropriate to go to the emergency department.   Time call ended: 1:58 PM  I provided 12 minutes of non-face-to-face time during this encounter.  Hendricks Limes, MSN, APRN, FNP-C [facility] 03/04/19

## 2019-03-05 ENCOUNTER — Other Ambulatory Visit: Payer: Self-pay | Admitting: Family Medicine

## 2019-03-05 DIAGNOSIS — E1169 Type 2 diabetes mellitus with other specified complication: Secondary | ICD-10-CM

## 2019-03-09 ENCOUNTER — Encounter: Payer: Self-pay | Admitting: Family Medicine

## 2019-03-09 ENCOUNTER — Other Ambulatory Visit: Payer: Self-pay

## 2019-03-09 ENCOUNTER — Ambulatory Visit: Payer: BC Managed Care – PPO | Admitting: Family Medicine

## 2019-03-09 VITALS — BP 123/79 | HR 79 | Temp 97.9°F | Ht 71.0 in | Wt 218.0 lb

## 2019-03-09 DIAGNOSIS — I152 Hypertension secondary to endocrine disorders: Secondary | ICD-10-CM

## 2019-03-09 DIAGNOSIS — E1159 Type 2 diabetes mellitus with other circulatory complications: Secondary | ICD-10-CM

## 2019-03-09 DIAGNOSIS — J9801 Acute bronchospasm: Secondary | ICD-10-CM | POA: Diagnosis not present

## 2019-03-09 DIAGNOSIS — E1165 Type 2 diabetes mellitus with hyperglycemia: Secondary | ICD-10-CM

## 2019-03-09 DIAGNOSIS — Z23 Encounter for immunization: Secondary | ICD-10-CM

## 2019-03-09 DIAGNOSIS — E785 Hyperlipidemia, unspecified: Secondary | ICD-10-CM

## 2019-03-09 DIAGNOSIS — E1169 Type 2 diabetes mellitus with other specified complication: Secondary | ICD-10-CM

## 2019-03-09 DIAGNOSIS — I10 Essential (primary) hypertension: Secondary | ICD-10-CM

## 2019-03-09 LAB — BAYER DCA HB A1C WAIVED: HB A1C (BAYER DCA - WAIVED): 8.5 % — ABNORMAL HIGH (ref <7.0)

## 2019-03-09 MED ORDER — ROSUVASTATIN CALCIUM 10 MG PO TABS: 10.0000 mg | ORAL_TABLET | Freq: Every day | ORAL | 0 refills | Status: DC

## 2019-03-09 MED ORDER — BUDESONIDE-FORMOTEROL FUMARATE 80-4.5 MCG/ACT IN AERO: 2.0000 | INHALATION_SPRAY | Freq: Two times a day (BID) | RESPIRATORY_TRACT | 3 refills | Status: DC

## 2019-03-09 NOTE — Progress Notes (Signed)
Subjective: CC: f/u DM2, HTN, HLD PCP: Janora Norlander, DO DPO:EUMPNT Seifer is a 63 y.o. male presenting to clinic today for:  1. Type 2 Diabetes w/ HTN, HLD; SOB:  Janumet was added to his regimen last visit due to uncontrolled blood sugar.  Patient reports he has been trying to watch what he eats but goes on to admit that he could do better as he still eats quite a bit of carbs (breads, pasta, rice, potatoes.  He tries to eat sugar free desserts and drinks diet colas.  Taking medication(s): Janumet 50-1000 twice daily and Jardiance 25 mg daily.  He is compliant with Crestor 10 mg daily and Vascepa.  Sometimes the Vascepa does cause some fish taste in his mouth and he does not always take the evening dose due to this.  He is compliant with lisinopril 5 mg daily.  Overall he feels better now that he is discontinued the Metformin.  He does admit to getting short of breath with certain physical activities like going up flights of stairs or running.  Otherwise denies any wheezing or shortness of breath.  He is currently being treated for sinus infection with Omnicef and prednisone.  He has albuterol on hand if needed but does not use this very often.  He is a former smoker that quit in 2016.  Last eye exam: due Last foot exam: UTD Last A1c:  Lab Results  Component Value Date   HGBA1C 8.7 (H) 11/28/2018   Nephropathy screen indicated?: on ACE-I Last flu, zoster and/or pneumovax:  Immunization History  Administered Date(s) Administered  . Influenza,inj,Quad PF,6+ Mos 12/29/2013, 12/30/2014, 02/14/2016, 01/16/2017, 01/24/2018  . Pneumococcal Polysaccharide-23 03/12/2009  . Tdap 12/30/2014  . Zoster 02/22/2012    ROS: Denies dizziness, LOC, polyuria, polydipsia, unintended weight loss/gain, foot ulcerations, numbness or tingling in extremities,  or chest pain.    ROS: Per HPI  Allergies  Allergen Reactions  . Augmentin [Amoxicillin-Pot Clavulanate] Itching and Nausea And  Vomiting  . Codeine Itching and Nausea And Vomiting  . Penicillins Hives and Itching    Has patient had a PCN reaction causing immediate rash, facial/tongue/throat swelling, SOB or lightheadedness with hypotension: No Has patient had a PCN reaction causing severe rash involving mucus membranes or skin necrosis: No Has patient had a PCN reaction that required hospitalization No Has patient had a PCN reaction occurring within the last 10 years: No If all of the above answers are "NO", then may proceed with Cephalosporin use.   . Atorvastatin     Myalgias and fatigue  . Oxycontin [Oxycodone Hcl] Hives and Itching   Past Medical History:  Diagnosis Date  . Anxiety   . Arthritis   . 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 Diabetes mellitus without complication ([place])   . Dyslipidemia   . GERD (gastroesophageal reflux disease)   . Numbness    right side of tongue since 2016 surgery  . Sleep apnea    uses cpap  . Tobacco abuse     Current Outpatient Medications:  .  albuterol (PROVENTIL HFA;VENTOLIN HFA) 108 (90 Base) MCG/ACT inhaler, Inhale 2 puffs into the lungs every 6 (six) hours as needed for wheezing., Disp: 1 Inhaler, Rfl: 3 .  aspirin 81 MG tablet, Take 1 tablet (81 mg total) by mouth daily., Disp: 30 tablet, Rfl:  .  cefdinir (OMNICEF) 300 MG capsule, Take 1 capsule (300 mg total) by mouth 2 (two) times daily. 1 po BID, Disp: 20 capsule, Rfl: 0 .  cycloSPORINE (RESTASIS) 0.05 % ophthalmic emulsion, Place 1 drop into both eyes 2 (two) times daily. , Disp: , Rfl:  .  diclofenac sodium (VOLTAREN) 1 % GEL, Apply 4 g topically 4 (four) times daily., Disp: 350 g, Rfl: 1 .  empagliflozin (JARDIANCE) 25 MG TABS tablet, Take 25 mg by mouth daily., Disp: 90 tablet, Rfl: 3 .  fenofibrate 160 MG tablet, Take 1 tablet (160 mg total) by mouth daily., Disp: 90 tablet, Rfl: 3 .  fluticasone (FLONASE) 50 MCG/ACT nasal spray, Place 2 sprays into both nostrils daily., Disp: 16 g, Rfl: 11 .  Icosapent Ethyl 1 g CAPS,  Take 2 capsules (2 g total) by mouth 2 (two) times daily., Disp: 360 capsule, Rfl: 3 .  JANUMET 50-1000 MG tablet, TAKE ONE TABLET TWICE A DAY WITH MEALS., Disp: 60 tablet, Rfl: 2 .  lisinopril (PRINIVIL,ZESTRIL) 5 MG tablet, Take 1 tablet (5 mg total) by mouth daily., Disp: 90 tablet, Rfl: 3 .  predniSONE (STERAPRED UNI-PAK 21 TAB) 10 MG (21) TBPK tablet, Use as directed on back of pill pack, Disp: 21 tablet, Rfl: 0 .  rosuvastatin (CRESTOR) 10 MG tablet, Take 1 tablet (10 mg total) by mouth daily., Disp: 90 tablet, Rfl: 0 .  Vitamin D, Ergocalciferol, (DRISDOL) 1.25 MG (50000 UT) CAPS capsule, Take 1 capsule (50,000 Units total) by mouth every 7 (seven) days., Disp: 12 capsule, Rfl: 3 Social History   Socioeconomic History  . Marital status: Married    Spouse name: Not on file  . Number of children: 2  . Years of education: Not on file  . Highest education level: Not on file  Occupational History  . Occupation: Programmer, systems: SELF EMPLOYED  Tobacco Use  . Smoking status: Former Smoker    Packs/day: 0.00    Types: Cigarettes    Quit date: 01/17/2015    Years since quitting: 4.1  . Smokeless tobacco: Never Used  . Tobacco comment: He has patches.  Substance and Sexual Activity  . Alcohol use: Yes    Comment: social  . Drug use: No  . Sexual activity: Not on file  Other Topics Concern  . Not on file  Social History Narrative   Lives at home with wife.    Social Determinants of Health   Financial Resource Strain:   . Difficulty of Paying Living Expenses: Not on file  Food Insecurity:   . Worried About Charity fundraiser in the Last Year: Not on file  . Ran Out of Food in the Last Year: Not on file  Transportation Needs:   . Lack of Transportation (Medical): Not on file  . Lack of Transportation (Non-Medical): Not on file  Physical Activity:   . Days of Exercise per Week: Not on file  . Minutes of Exercise per Session: Not on file  Stress:   . Feeling of Stress :  Not on file  Social Connections:   . Frequency of Communication with Friends and Family: Not on file  . Frequency of Social Gatherings with Friends and Family: Not on file  . Attends Religious Services: Not on file  . Active Member of Clubs or Organizations: Not on file  . Attends Archivist Meetings: Not on file  . Marital Status: Not on file  Intimate Partner Violence:   . Fear of Current or Ex-Partner: Not on file  . Emotionally Abused: Not on file  . Physically Abused: Not on file  . Sexually Abused: Not  on file   Family History  Problem Relation Age of Onset  . Stroke Father        Died age 43  . CAD Father   . CAD Brother 76       CABG    Objective: Office vital signs reviewed. BP 123/79   Pulse 79   Temp 97.9 F (36.6 C) (Temporal)   Ht _0  (1.803 m)   Wt 218 lb (98.9 kg)   SpO2 97%   BMI 30.40 kg/m   Physical Examination:  General: Awake, alert, obese, well appearing, No acute distress HEENT: Normal, sclera white, MMM Cardio: regular rate and rhythm, S1S2 heard, no murmurs appreciated Pulm: Globally decreased breath sounds with end expiratory wheezes noted.  Air movement is fair.  Normal work of breathing on room air.  Normal pulse ox noted. Extremities: warm, well perfused, No edema, cyanosis or clubbing; +2 pulses bilaterally  Assessment/ Plan: 63 y.o. male   1. Uncontrolled type 2 diabetes mellitus with hyperglycemia, without long-term current use of insulin (HCC) A1c slightly improved but still above goal at 8.5 today.  We discussed consideration for weekly injectable such as Trulicity or Ozempic.  He is somewhat reluctant to use a needle as he describes fear surrounding injectables.  I had a frank discussion with him with regards to need for diet modification and increase physical activity.  He feels that he can indeed get to goal with lifestyle modification and is asking for a 64-monthtrial of this before proceeding with addition of medication.   Currently on max dose of Janumet and Jardiance.  We discussed the risk of stroke and heart attack with uncontrolled blood sugars.  Patient understood these risks.  We will follow-up in 3 months.  In the meantime, he will contact his insurance to see which injectable is better covered so that we have a backup plan should the sugars not be below A1c of 7. - hgba1c  2. Hyperlipidemia associated with type 2 diabetes mellitus (HCC) Continue Crestor and Vascepa. - rosuvastatin (CRESTOR) 10 MG tablet; Take 1 tablet (10 mg total) by mouth daily.  Dispense: 90 tablet; Refill: 0 - CMP14+EGFR - Lipid Panel  3. Hypertension associated with diabetes ([place] Controlled.  Continue current regimen  4. Bronchospasm Trial of Symbicort given lung exam and reports of frequent episodes of bronchitis.  I do question if he has an underlying undiagnosed COPD.  May need to consider formal PFTs at some point.  A coupon was provided to the patient to hopefully make this medication free of cost to him.  He will follow-up in 3 months, sooner if needed.  I reinforced need for mouth rinses after each use. - budesonide-formoterol (SYMBICORT) 80-4.5 MCG/ACT inhaler; Inhale 2 puffs into the lungs 2 (two) times daily. Bringing in coupon: BIN [id number] Grp [id number] ID [id number] Dispense: 1 Inhaler; Refill: 3   Orders Placed This Encounter  Procedures  . hgba1c  . CMP14+EGFR  . Lipid Panel   Meds ordered this encounter  Medications  . rosuvastatin (CRESTOR) 10 MG tablet    Sig: Take 1 tablet (10 mg total) by mouth daily.    Dispense:  90 tablet    Refill:  0  . budesonide-formoterol (SYMBICORT) 80-4.5 MCG/ACT inhaler    Sig: Inhale 2 puffs into the lungs 2 (two) times daily. Bringing in coupon: BIN [id number] Grp [id number] ID [id number]   Dispense:  1 Inhaler    Refill:  3     Jamyia Fortune M  Lajuana Ripple, [place] [phone]

## 2019-03-09 NOTE — Patient Instructions (Signed)
We talked about the need to really cut out carbohydrates (potatoes, rice, pasta, breads, sweets).  The alternative is that we are going to have to add medication.  Next step is an injectable.  I am thinking about Ozempic or Trulicity.  See if your insurance pays for either and what your copay would be in preparation for our next appointment.  I want you to try using Symbicort to see if this improves your breathing and coughing spells. Use this every day.

## 2019-03-10 LAB — CMP14+EGFR
ALT: 16 IU/L (ref 0–44)
AST: 14 IU/L (ref 0–40)
Albumin/Globulin Ratio: 1.6 (ref 1.2–2.2)
Albumin: 4.6 g/dL (ref 3.8–4.8)
Alkaline Phosphatase: 55 IU/L (ref 39–117)
BUN/Creatinine Ratio: 20 (ref 10–24)
BUN: 19 mg/dL (ref 8–27)
Bilirubin Total: 0.3 mg/dL (ref 0.0–1.2)
CO2: 21 mmol/L (ref 20–29)
Calcium: 9.3 mg/dL (ref 8.6–10.2)
Chloride: 98 mmol/L (ref 96–106)
Creatinine, Ser: 0.95 mg/dL (ref 0.76–1.27)
GFR calc Af Amer: 98 mL/min/{1.73_m2} (ref >59)
GFR calc non Af Amer: 85 mL/min/{1.73_m2} (ref >59)
Globulin, Total: 2.9 g/dL (ref 1.5–4.5)
Glucose: 224 mg/dL — ABNORMAL HIGH (ref 65–99)
Potassium: 4.4 mmol/L (ref 3.5–5.2)
Sodium: 135 mmol/L (ref 134–144)
Total Protein: 7.5 g/dL (ref 6.0–8.5)

## 2019-03-10 LAB — LIPID PANEL
Chol/HDL Ratio: 3.3 ratio (ref 0.0–5.0)
Cholesterol, Total: 131 mg/dL (ref 100–199)
HDL: 40 mg/dL (ref >39)
LDL Chol Calc (NIH): 51 mg/dL (ref 0–99)
Triglycerides: 254 mg/dL — ABNORMAL HIGH (ref 0–149)
VLDL Cholesterol Cal: 40 mg/dL (ref 5–40)

## 2019-03-19 ENCOUNTER — Other Ambulatory Visit: Payer: Self-pay | Admitting: Family Medicine

## 2019-03-23 ENCOUNTER — Other Ambulatory Visit: Payer: Self-pay | Admitting: Family Medicine

## 2019-03-23 DIAGNOSIS — E781 Pure hyperglyceridemia: Secondary | ICD-10-CM

## 2019-03-23 MED ORDER — OMEGA-3-ACID ETHYL ESTERS 1 G PO CAPS: 2.0000 g | ORAL_CAPSULE | Freq: Two times a day (BID) | ORAL | 2 refills | Status: DC

## 2019-04-18 ENCOUNTER — Other Ambulatory Visit: Payer: Self-pay | Admitting: Family Medicine

## 2019-04-27 ENCOUNTER — Telehealth: Payer: Self-pay | Admitting: Family Medicine

## 2019-04-27 ENCOUNTER — Other Ambulatory Visit: Payer: Self-pay | Admitting: Family Medicine

## 2019-04-27 ENCOUNTER — Telehealth: Payer: Self-pay | Admitting: *Deleted

## 2019-04-27 DIAGNOSIS — E785 Hyperlipidemia, unspecified: Secondary | ICD-10-CM

## 2019-04-27 DIAGNOSIS — E1169 Type 2 diabetes mellitus with other specified complication: Secondary | ICD-10-CM

## 2019-04-27 NOTE — Telephone Encounter (Signed)
Per pt and pharmacy, pt is not taking Janumet, only taking Metformin

## 2019-04-27 NOTE — Telephone Encounter (Signed)
done

## 2019-06-16 ENCOUNTER — Other Ambulatory Visit: Payer: Self-pay | Admitting: Family Medicine

## 2019-07-07 ENCOUNTER — Other Ambulatory Visit: Payer: Self-pay | Admitting: Family Medicine

## 2019-07-07 DIAGNOSIS — E1169 Type 2 diabetes mellitus with other specified complication: Secondary | ICD-10-CM

## 2019-09-03 ENCOUNTER — Telehealth: Payer: Self-pay | Admitting: Pharmacist

## 2019-10-27 ENCOUNTER — Other Ambulatory Visit: Payer: Self-pay | Admitting: *Deleted

## 2019-10-27 ENCOUNTER — Other Ambulatory Visit: Payer: Self-pay

## 2019-10-27 ENCOUNTER — Other Ambulatory Visit: Payer: No Typology Code available for payment source

## 2019-10-27 DIAGNOSIS — E1165 Type 2 diabetes mellitus with hyperglycemia: Secondary | ICD-10-CM

## 2019-10-27 DIAGNOSIS — E1159 Type 2 diabetes mellitus with other circulatory complications: Secondary | ICD-10-CM

## 2019-10-27 DIAGNOSIS — E785 Hyperlipidemia, unspecified: Secondary | ICD-10-CM

## 2019-10-27 DIAGNOSIS — E1169 Type 2 diabetes mellitus with other specified complication: Secondary | ICD-10-CM

## 2019-10-27 DIAGNOSIS — I152 Hypertension secondary to endocrine disorders: Secondary | ICD-10-CM

## 2019-10-27 LAB — BAYER DCA HB A1C WAIVED: HB A1C (BAYER DCA - WAIVED): 7.7 % — ABNORMAL HIGH (ref <7.0)

## 2019-10-28 LAB — CMP14+EGFR
ALT: 18 IU/L (ref 0–44)
AST: 16 IU/L (ref 0–40)
Albumin/Globulin Ratio: 1.9 (ref 1.2–2.2)
Albumin: 4.6 g/dL (ref 3.8–4.8)
Alkaline Phosphatase: 54 IU/L (ref 48–121)
BUN/Creatinine Ratio: 19 (ref 10–24)
BUN: 15 mg/dL (ref 8–27)
Bilirubin Total: 0.6 mg/dL (ref 0.0–1.2)
CO2: 23 mmol/L (ref 20–29)
Calcium: 9.3 mg/dL (ref 8.6–10.2)
Chloride: 101 mmol/L (ref 96–106)
Creatinine, Ser: 0.78 mg/dL (ref 0.76–1.27)
GFR calc Af Amer: 110 mL/min/{1.73_m2} (ref >59)
GFR calc non Af Amer: 95 mL/min/{1.73_m2} (ref >59)
Globulin, Total: 2.4 g/dL (ref 1.5–4.5)
Glucose: 160 mg/dL — ABNORMAL HIGH (ref 65–99)
Potassium: 4.4 mmol/L (ref 3.5–5.2)
Sodium: 138 mmol/L (ref 134–144)
Total Protein: 7 g/dL (ref 6.0–8.5)

## 2019-10-28 LAB — CBC WITH DIFFERENTIAL/PLATELET
Basophils Absolute: 0.1 10*3/uL (ref 0.0–0.2)
Basos: 1 %
EOS (ABSOLUTE): 0.2 10*3/uL (ref 0.0–0.4)
Eos: 3 %
Hematocrit: 46.2 % (ref 37.5–51.0)
Hemoglobin: 15.6 g/dL (ref 13.0–17.7)
Immature Grans (Abs): 0 10*3/uL (ref 0.0–0.1)
Immature Granulocytes: 0 %
Lymphocytes Absolute: 1.8 10*3/uL (ref 0.7–3.1)
Lymphs: 34 %
MCH: 31.2 pg (ref 26.6–33.0)
MCHC: 33.8 g/dL (ref 31.5–35.7)
MCV: 92 fL (ref 79–97)
Monocytes Absolute: 0.4 10*3/uL (ref 0.1–0.9)
Monocytes: 8 %
Neutrophils Absolute: 2.9 10*3/uL (ref 1.4–7.0)
Neutrophils: 54 %
Platelets: 171 10*3/uL (ref 150–450)
RBC: 5 x10E6/uL (ref 4.14–5.80)
RDW: 13 % (ref 11.6–15.4)
WBC: 5.3 10*3/uL (ref 3.4–10.8)

## 2019-10-28 LAB — LIPID PANEL
Chol/HDL Ratio: 2.4 ratio (ref 0.0–5.0)
Cholesterol, Total: 104 mg/dL (ref 100–199)
HDL: 44 mg/dL (ref >39)
LDL Chol Calc (NIH): 34 mg/dL (ref 0–99)
Triglycerides: 153 mg/dL — ABNORMAL HIGH (ref 0–149)
VLDL Cholesterol Cal: 26 mg/dL (ref 5–40)

## 2019-10-29 ENCOUNTER — Other Ambulatory Visit: Payer: Self-pay

## 2019-10-29 ENCOUNTER — Other Ambulatory Visit: Payer: Self-pay | Admitting: Family Medicine

## 2019-10-29 ENCOUNTER — Ambulatory Visit: Payer: BC Managed Care – PPO | Admitting: Family Medicine

## 2019-10-29 ENCOUNTER — Telehealth: Payer: Self-pay | Admitting: Pharmacist

## 2019-10-29 ENCOUNTER — Encounter: Payer: Self-pay | Admitting: Family Medicine

## 2019-10-29 ENCOUNTER — Telehealth: Payer: Self-pay | Admitting: Family Medicine

## 2019-10-29 VITALS — BP 122/77 | HR 75 | Temp 96.8°F | Ht 71.0 in | Wt 223.4 lb

## 2019-10-29 DIAGNOSIS — E785 Hyperlipidemia, unspecified: Secondary | ICD-10-CM

## 2019-10-29 DIAGNOSIS — E1159 Type 2 diabetes mellitus with other circulatory complications: Secondary | ICD-10-CM

## 2019-10-29 DIAGNOSIS — E1169 Type 2 diabetes mellitus with other specified complication: Secondary | ICD-10-CM | POA: Diagnosis not present

## 2019-10-29 DIAGNOSIS — E1165 Type 2 diabetes mellitus with hyperglycemia: Secondary | ICD-10-CM

## 2019-10-29 DIAGNOSIS — I10 Essential (primary) hypertension: Secondary | ICD-10-CM | POA: Diagnosis not present

## 2019-10-29 DIAGNOSIS — I152 Hypertension secondary to endocrine disorders: Secondary | ICD-10-CM

## 2019-10-29 MED ORDER — ROSUVASTATIN CALCIUM 10 MG PO TABS: ORAL_TABLET | ORAL | 3 refills | Status: DC

## 2019-10-29 MED ORDER — LISINOPRIL 5 MG PO TABS: ORAL_TABLET | ORAL | 3 refills | Status: DC

## 2019-10-29 NOTE — Telephone Encounter (Signed)
Lmtcb.

## 2019-10-29 NOTE — Telephone Encounter (Signed)
Pt returning call to the nurse.

## 2019-10-29 NOTE — Telephone Encounter (Signed)
Pt would like to discuss his medication changes.

## 2019-10-29 NOTE — Progress Notes (Signed)
Subjective: CC: f/u DM2, HTN, HLD PCP: Scott [name] RSW:[name] is a 64 y.o. male presenting to clinic today for:  1. Type 2 Diabetes w/ HTN, HLD:  Patient reports compliance with Jardiance 25 mg daily, Metformin 1000 mg twice daily, lisinopril 5 mg daily and Crestor 10 mg daily.  He is no longer taking low vase a.  Has been trying to use over-the-counter vitamin C, cinnamon, fish oil and vitamin D as his insurance is lapsed.  Last eye exam: due Last foot exam: needs Last A1c:  Lab Results  Component Value Date   HGBA1C 7.7 (H) 10/27/2019   Nephropathy screen indicated?: on ACE-I Last flu, zoster and/or pneumovax:  Immunization History  Administered Date(s) Administered  . Influenza,inj,Quad PF,6+ Mos 12/29/2013, 12/30/2014, 02/14/2016, 01/16/2017, 01/24/2018, 03/09/2019  . Pneumococcal Polysaccharide-23 03/12/2009  . Tdap 12/30/2014  . Zoster 02/22/2012    ROS: Denies dizziness, LOC, polyuria, polydipsia, unintended weight loss/gain, foot ulcerations, numbness or tingling in extremities,  or chest pain.     ROS: Per HPI  Allergies  Allergen Reactions  . Augmentin [Amoxicillin-Pot Clavulanate] Itching and Nausea And Vomiting  . Codeine Itching and Nausea And Vomiting  . Penicillins Hives and Itching    Has patient had a PCN reaction causing immediate rash, facial/tongue/throat swelling, SOB or lightheadedness with hypotension: No Has patient had a PCN reaction causing severe rash involving mucus membranes or skin necrosis: No Has patient had a PCN reaction that required hospitalization No Has patient had a PCN reaction occurring within the last 10 years: No If all of the above answers are "NO", then may proceed with Cephalosporin use.   . Atorvastatin     Myalgias and fatigue  . Oxycontin [Oxycodone Hcl] Hives and Itching   Past Medical History:  Diagnosis Date  . Anxiety   . Arthritis   . Asbestosis (HCC)   . Diabetes mellitus without  complication (HCC)   . Dyslipidemia   . GERD (gastroesophageal reflux disease)   . Numbness    right side of tongue since 2016 surgery  . Sleep apnea    uses cpap  . Tobacco abuse     Current Outpatient Medications:  .  albuterol (PROVENTIL HFA;VENTOLIN HFA) 108 (90 Base) MCG/ACT inhaler, Inhale 2 puffs into the lungs every 6 (six) hours as needed for wheezing., Disp: 1 Inhaler, Rfl: 3 .  aspirin 81 MG tablet, Take 1 tablet (81 mg total) by mouth daily., Disp: 30 tablet, Rfl:  .  budesonide-formoterol (SYMBICORT) 80-4.5 MCG/ACT inhaler, Inhale 2 puffs into the lungs 2 (two) times daily. Bringing in coupon: BIN [id number], Grp [id number]; ID [id number], Disp: 1 Inhaler, Rfl: 3 .  cycloSPORINE (RESTASIS) 0.05 % ophthalmic emulsion, Place 1 drop into both eyes 2 (two) times daily. , Disp: , Rfl:  .  diclofenac sodium (VOLTAREN) 1 % GEL, Apply 4 g topically 4 (four) times daily., Disp: 350 g, Rfl: 1 .  empagliflozin (JARDIANCE) 25 MG TABS tablet, Take 25 mg by mouth daily., Disp: 90 tablet, Rfl: 3 .  fluticasone (FLONASE) 50 MCG/ACT nasal spray, Place 2 sprays into both nostrils daily., Disp: 16 g, Rfl: 11 .  lisinopril (ZESTRIL) 5 MG tablet, TAKE ONE (1) TABLET EACH DAY, Disp: 90 tablet, Rfl: 3 .  omega-3 acid ethyl esters (LOVAZA) 1 g capsule, Take 2 capsules (2 g total) by mouth 2 (two) times daily., Disp: 120 capsule, Rfl: 2 .  rosuvastatin (CRESTOR) 10 MG tablet, TAKE ONE (1) TABLET EACH DAY, Disp:  90 tablet, Rfl: 3 .  Vitamin D, Ergocalciferol, (DRISDOL) 1.25 MG (50000 UT) CAPS capsule, Take 1 capsule (50,000 Units total) by mouth every 7 (seven) days., Disp: 12 capsule, Rfl: 3 .  metFORMIN (GLUCOPHAGE) 500 MG tablet, Take 2 tablets (1,000 mg total) by mouth 2 (two) times daily., Disp: 360 tablet, Rfl: 1 Social History   Socioeconomic History  . Marital status: Married    Spouse name: Not on file  . Number of children: 2  . Years of education: Not on file  . Highest education level: Not  on file  Occupational History  . Occupation: Ecologist: SELF EMPLOYED  Tobacco Use  . Smoking status: Former Smoker    Packs/day: 0.00    Types: Cigarettes    Quit date: 01/17/2015    Years since quitting: 4.7  . Smokeless tobacco: Never Used  . Tobacco comment: He has patches.  Vaping Use  . Vaping Use: Never used  Substance and Sexual Activity  . Alcohol use: Yes    Comment: social  . Drug use: No  . Sexual activity: Not on file  Other Topics Concern  . Not on file  Social History Narrative   Lives at home with wife.    Social Determinants of Health   Financial Resource Strain:   . Difficulty of Paying Living Expenses: Not on file  Food Insecurity:   . Worried About Programme researcher, broadcasting/film/video in the Last Year: Not on file  . Ran Out of Food in the Last Year: Not on file  Transportation Needs:   . Lack of Transportation (Medical): Not on file  . Lack of Transportation (Non-Medical): Not on file  Physical Activity:   . Days of Exercise per Week: Not on file  . Minutes of Exercise per Session: Not on file  Stress:   . Feeling of Stress : Not on file  Social Connections:   . Frequency of Communication with Friends and Family: Not on file  . Frequency of Social Gatherings with Friends and Family: Not on file  . Attends Religious Services: Not on file  . Active Member of Clubs or Organizations: Not on file  . Attends Banker Meetings: Not on file  . Marital Status: Not on file  Intimate Partner Violence:   . Fear of Current or Ex-Partner: Not on file  . Emotionally Abused: Not on file  . Physically Abused: Not on file  . Sexually Abused: Not on file   Family History  Problem Relation Age of Onset  . Stroke Father        Died age 4  . CAD Father   . CAD Brother 38       CABG    Objective: Office vital signs reviewed. BP 122/77   Pulse 75   Temp (!) 96.8 F (36 C)   Ht 5\' 11"  (1.803 m)   Wt 223 lb 6.4 oz (101.3 kg)   SpO2 96%   BMI 31.16  kg/m   Physical Examination:  General: Awake, alert, obese, well appearing, No acute distress HEENT: Normal, sclera white, MMM Cardio: regular rate and rhythm, S1S2 heard, no murmurs appreciated Pulm: Globally decreased breath sounds with end expiratory wheezes noted.  Air movement is fair.  Normal work of breathing on room air.  Normal pulse ox noted. Extremities: warm, well perfused, No edema, cyanosis or clubbing; +2 pulses bilaterally  Assessment/ Plan: 64 y.o. male   1. Uncontrolled type 2 diabetes mellitus with  hyperglycemia, without long-term current use of insulin (HCC) Luckily, he was able to check with Raynelle Fanning today for samples of the Jardiance.  I would like him to continue the Jardiance.  His sugar remains uncontrolled at 7.7 today.  Trial of Rybelsus.  3 mg given #30.  He will follow-up with Raynelle Fanning in the next couple weeks to recheck.  I like to see him back in 3 months with me.  Plan for diabetic foot exam at that visit Lab Results  Component Value Date   HGBA1C 7.7 (H) 10/27/2019   2. Hypertension associated with diabetes (HCC) Continue lisinopril - lisinopril (ZESTRIL) 5 MG tablet; TAKE ONE (1) TABLET EACH DAY  Dispense: 90 tablet; Refill: 3  3. Hyperlipidemia associated with type 2 diabetes mellitus (HCC) Continue statin - rosuvastatin (CRESTOR) 10 MG tablet; TAKE ONE (1) TABLET EACH DAY  Dispense: 90 tablet; Refill: 3  No orders of the defined types were placed in this encounter.  Meds ordered this encounter  Medications  . lisinopril (ZESTRIL) 5 MG tablet    Sig: TAKE ONE (1) TABLET EACH DAY    Dispense:  90 tablet    Refill:  3  . rosuvastatin (CRESTOR) 10 MG tablet    Sig: TAKE ONE (1) TABLET EACH DAY    Dispense:  90 tablet    Refill:  3  . Semaglutide (RYBELSUS) 3 MG TABS    Sig: Take 3 mg by mouth daily.    Dispense:  30 tablet    Refill:  0     Scott Olazabal Hulen Skains, [name] [facility] [phone]

## 2019-10-29 NOTE — Telephone Encounter (Signed)
New start Rybelsus for patient A1c7.7%, diet is compliant (on jardiance as well) Patient would like to help suppress appetite Sample given of Rybelsus #30 day LOT#[id number], EXP 4/23 Encouraged patient to call as needed

## 2019-10-30 MED ORDER — METFORMIN HCL 500 MG PO TABS
1000.0000 mg | ORAL_TABLET | Freq: Two times a day (BID) | ORAL | 1 refills | Status: DC
Start: 2019-10-30 — End: 2020-10-14

## 2019-10-30 NOTE — Addendum Note (Signed)
Addended by: Vanice Sarah D on: 10/30/2019 11:22 AM   Modules accepted: Orders

## 2019-10-30 NOTE — Telephone Encounter (Signed)
Patient with high deductible plan, cannot afford copays  Will have to bridge with samples until patient is medicare

## 2019-11-02 MED ORDER — RYBELSUS 3 MG PO TABS: 3.0000 mg | ORAL_TABLET | Freq: Every day | ORAL | 0 refills | Status: DC

## 2020-07-06 ENCOUNTER — Telehealth: Payer: Self-pay | Admitting: Pharmacist

## 2020-07-27 MED ORDER — DAPAGLIFLOZIN PROPANEDIOL 10 MG PO TABS: 10.0000 mg | ORAL_TABLET | Freq: Every day | ORAL | 3 refills | Status: DC

## 2020-07-27 NOTE — Telephone Encounter (Signed)
Patient denied for [facility] patient assistance [place])  Will apply for [place] farxiga [place]

## 2020-08-18 ENCOUNTER — Other Ambulatory Visit: Payer: No Typology Code available for payment source

## 2020-08-18 ENCOUNTER — Other Ambulatory Visit: Payer: Self-pay

## 2020-08-18 DIAGNOSIS — E1159 Type 2 diabetes mellitus with other circulatory complications: Secondary | ICD-10-CM

## 2020-08-18 DIAGNOSIS — E1165 Type 2 diabetes mellitus with hyperglycemia: Secondary | ICD-10-CM | POA: Diagnosis not present

## 2020-08-18 DIAGNOSIS — I152 Hypertension secondary to endocrine disorders: Secondary | ICD-10-CM | POA: Diagnosis not present

## 2020-08-18 DIAGNOSIS — E781 Pure hyperglyceridemia: Secondary | ICD-10-CM | POA: Diagnosis not present

## 2020-08-18 LAB — BAYER DCA HB A1C WAIVED: HB A1C (BAYER DCA - WAIVED): 7.7 % — ABNORMAL HIGH (ref <7.0)

## 2020-08-19 LAB — CMP14+EGFR
ALT: 16 IU/L (ref 0–44)
AST: 12 IU/L (ref 0–40)
Albumin/Globulin Ratio: 1.7 (ref 1.2–2.2)
Albumin: 4.6 g/dL (ref 3.8–4.8)
Alkaline Phosphatase: 56 IU/L (ref 44–121)
BUN/Creatinine Ratio: 13 (ref 10–24)
BUN: 12 mg/dL (ref 8–27)
Bilirubin Total: 0.7 mg/dL (ref 0.0–1.2)
CO2: 20 mmol/L (ref 20–29)
Calcium: 8.9 mg/dL (ref 8.6–10.2)
Chloride: 101 mmol/L (ref 96–106)
Creatinine, Ser: 0.94 mg/dL (ref 0.76–1.27)
Globulin, Total: 2.7 g/dL (ref 1.5–4.5)
Glucose: 175 mg/dL — ABNORMAL HIGH (ref 65–99)
Potassium: 4.5 mmol/L (ref 3.5–5.2)
Sodium: 135 mmol/L (ref 134–144)
Total Protein: 7.3 g/dL (ref 6.0–8.5)
eGFR: 90 mL/min/{1.73_m2} (ref >59)

## 2020-08-19 LAB — CBC WITH DIFFERENTIAL/PLATELET
Basophils Absolute: 0 10*3/uL (ref 0.0–0.2)
Basos: 1 %
EOS (ABSOLUTE): 0.1 10*3/uL (ref 0.0–0.4)
Eos: 3 %
Hematocrit: 45.1 % (ref 37.5–51.0)
Hemoglobin: 15.2 g/dL (ref 13.0–17.7)
Immature Grans (Abs): 0 10*3/uL (ref 0.0–0.1)
Immature Granulocytes: 0 %
Lymphocytes Absolute: 1.7 10*3/uL (ref 0.7–3.1)
Lymphs: 33 %
MCH: 31.3 pg (ref 26.6–33.0)
MCHC: 33.7 g/dL (ref 31.5–35.7)
MCV: 93 fL (ref 79–97)
Monocytes Absolute: 0.5 10*3/uL (ref 0.1–0.9)
Monocytes: 10 %
Neutrophils Absolute: 2.7 10*3/uL (ref 1.4–7.0)
Neutrophils: 53 %
Platelets: 147 10*3/uL — ABNORMAL LOW (ref 150–450)
RBC: 4.85 x10E6/uL (ref 4.14–5.80)
RDW: 12.9 % (ref 11.6–15.4)
WBC: 5 10*3/uL (ref 3.4–10.8)

## 2020-08-19 LAB — LIPID PANEL
Chol/HDL Ratio: 2.6 ratio (ref 0.0–5.0)
Cholesterol, Total: 105 mg/dL (ref 100–199)
HDL: 41 mg/dL (ref >39)
LDL Chol Calc (NIH): 39 mg/dL (ref 0–99)
Triglycerides: 149 mg/dL (ref 0–149)
VLDL Cholesterol Cal: 25 mg/dL (ref 5–40)

## 2020-08-19 NOTE — Progress Notes (Signed)
Patient aware and verbalizes understanding. 

## 2020-08-19 NOTE — Progress Notes (Signed)
Patient aware.

## 2020-08-22 ENCOUNTER — Ambulatory Visit (INDEPENDENT_AMBULATORY_CARE_PROVIDER_SITE_OTHER): Payer: Medicare Other | Admitting: Family Medicine

## 2020-08-22 ENCOUNTER — Encounter: Payer: Self-pay | Admitting: Family Medicine

## 2020-08-22 ENCOUNTER — Other Ambulatory Visit: Payer: Self-pay

## 2020-08-22 VITALS — BP 121/79 | HR 73 | Temp 97.6°F | Ht 71.0 in | Wt 219.6 lb

## 2020-08-22 DIAGNOSIS — E1159 Type 2 diabetes mellitus with other circulatory complications: Secondary | ICD-10-CM

## 2020-08-22 DIAGNOSIS — I152 Hypertension secondary to endocrine disorders: Secondary | ICD-10-CM | POA: Diagnosis not present

## 2020-08-22 DIAGNOSIS — E1169 Type 2 diabetes mellitus with other specified complication: Secondary | ICD-10-CM

## 2020-08-22 DIAGNOSIS — Z0001 Encounter for general adult medical examination with abnormal findings: Secondary | ICD-10-CM | POA: Diagnosis not present

## 2020-08-22 DIAGNOSIS — R1310 Dysphagia, unspecified: Secondary | ICD-10-CM

## 2020-08-22 DIAGNOSIS — E785 Hyperlipidemia, unspecified: Secondary | ICD-10-CM | POA: Diagnosis not present

## 2020-08-22 DIAGNOSIS — Z23 Encounter for immunization: Secondary | ICD-10-CM

## 2020-08-22 DIAGNOSIS — G4733 Obstructive sleep apnea (adult) (pediatric): Secondary | ICD-10-CM | POA: Diagnosis not present

## 2020-08-22 DIAGNOSIS — Z0000 Encounter for general adult medical examination without abnormal findings: Secondary | ICD-10-CM

## 2020-08-22 DIAGNOSIS — Z9989 Dependence on other enabling machines and devices: Secondary | ICD-10-CM | POA: Diagnosis not present

## 2020-08-22 DIAGNOSIS — E1165 Type 2 diabetes mellitus with hyperglycemia: Secondary | ICD-10-CM

## 2020-08-22 MED ORDER — RYBELSUS 3 MG PO TABS: 6.0000 mg | ORAL_TABLET | Freq: Every day | ORAL | 0 refills | Status: DC

## 2020-08-22 NOTE — Progress Notes (Signed)
Subjective:    Scott [name] is a 65 y.o. male who presents for Medicare Initial preventive examination.   Preventive Screening-Counseling & Management  Tobacco Social History   Tobacco Use  Smoking Status Former   Packs/day: 0.00   Pack years: 0.00   Types: Cigarettes   Quit date: 01/17/2015   Years since quitting: 5.6  Smokeless Tobacco Never  Tobacco Comments   He has patches.    Problems Prior to Visit 1. DM, HTN, HLD: His most recent A1c showed uncontrolled type 2 diabetes.  He has been on Jardiance 25 mg daily and plans to transition over to [place] 10 mg daily.  He is currently working with Raynelle Fanning, our clinical pharmacist, to get these medications through the manufacturer.  He is currently taking Rybelsus 3 mg daily.  He is also on metformin, lisinopril, Crestor and Lovaza.  No reports of chest pain, shortness of breath, polydipsia or polyuria.  He admits to some fatigue since being ill in May.  Did not test for COVID but suspects he may have had it.  Vision is much better since he has had cataract surgery and he will have an eye exam with Dr Conley Rolls again soon.  Due for diabetic foot exam.  2. OSA: Compliant with CPAP machine but does need a new one.  He was previously seen by [facility] pulmonology in [place] and got his current machine through [facility] home [facility].  Asking for referral back for new machine.  Uses a Nasal CPAP currently.  3.  Dysphagia Patient reports he suffered from dysphagia with increased frequency of the last several months.  He primarily notes that foods like rice, chicken and steak will get hung in his throat when he tries to get it back up he simply gets phlegm.  He has no problems with fluids.  He sees Dr. Ewing Schlein for colonoscopy.  Current Problems (verified) Patient Active Problem List   Diagnosis Date Noted   Hypertension associated with diabetes (HCC) 12/02/2018   Hyperlipidemia associated with type 2 diabetes mellitus (HCC) 12/02/2018   Primary  insomnia 06/14/2017   Essential hypertension, benign 06/14/2017   OSA (obstructive sleep apnea) 08/09/2016   Cholelithiasis with chronic cholecystitis 01/09/2016   Chest pain 11/29/2015   Vitamin D deficiency 10/06/2015   Hyperlipidemia 02/12/2013   Tobacco use disorder 01/05/2013   Back pain, acute 12/13/2012   Elevated blood pressure 12/04/2012   H/O asbestosis 12/04/2012   Erectile dysfunction 12/04/2012   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 1 Inhaler 3   aspirin 81 MG tablet Take 1 tablet (81 mg total) by mouth daily. 30 tablet    budesonide-formoterol (SYMBICORT) 80-4.5 MCG/ACT inhaler Inhale 2 puffs into the lungs 2 (two) times daily. Bringing in coupon: BIN [id number], Grp [id number]; ID [id number] 1 Inhaler 3   cycloSPORINE (RESTASIS) 0.05 % ophthalmic emulsion Place 1 drop into both eyes 2 (two) times daily.      dapagliflozin propanediol (FARXIGA) 10 MG TABS tablet Take 1 tablet (10 mg total) by mouth daily before  breakfast. 90 tablet 3   diclofenac sodium (VOLTAREN) 1 % GEL Apply 4 g topically 4 (four) times daily. 350 g 1   fluticasone (FLONASE) 50 MCG/ACT nasal spray Place 2 sprays into both nostrils daily. 16 g 11   lisinopril (ZESTRIL) 5 MG tablet TAKE ONE (1) TABLET EACH DAY 90 tablet 3   metFORMIN (GLUCOPHAGE) 500 MG tablet Take 2 tablets (1,000 mg total) by mouth 2 (two) times daily. 360 tablet 1   omega-3 acid ethyl esters (LOVAZA) 1 g capsule Take 2  capsules (2 g total) by mouth 2 (two) times daily. 120 capsule 2   rosuvastatin (CRESTOR) 10 MG tablet TAKE ONE (1) TABLET EACH DAY 90 tablet 3   Vitamin D, Ergocalciferol, (DRISDOL) 1.25 MG (50000 UT) CAPS capsule Take 1 capsule (50,000 Units total) by mouth every 7 (seven) days. 12 capsule 3   No current facility-administered medications on file prior to visit.    Current Medications (verified) Current Outpatient Medications  Medication Sig Dispense Refill   albuterol (PROVENTIL HFA;VENTOLIN HFA) 108 (90 Base) MCG/ACT inhaler Inhale 2 puffs into the lungs every 6 (six) hours as needed for wheezing. 1 Inhaler 3   aspirin 81 MG tablet Take 1 tablet (81 mg total) by mouth daily. 30 tablet    budesonide-formoterol (SYMBICORT) 80-4.5 MCG/ACT inhaler Inhale 2 puffs into the lungs 2 (two) times daily. Bringing in coupon: BIN [id number], Grp [id number]; ID [id number] 1 Inhaler 3   cycloSPORINE (RESTASIS) 0.05 % ophthalmic emulsion Place 1 drop into both eyes 2 (two) times daily.      dapagliflozin propanediol (FARXIGA) 10 MG TABS tablet Take 1 tablet (10 mg total) by mouth daily before breakfast. 90 tablet 3   diclofenac sodium (VOLTAREN) 1 % GEL Apply 4 g topically 4 (four) times daily. 350 g 1   fluticasone (FLONASE) 50 MCG/ACT nasal spray Place 2 sprays into both nostrils daily. 16 g 11   lisinopril (ZESTRIL) 5 MG tablet TAKE ONE (1) TABLET EACH DAY 90 tablet 3   metFORMIN (GLUCOPHAGE) 500 MG tablet Take 2 tablets (1,000 mg total) by mouth 2 (two) times daily. 360 tablet 1   omega-3 acid ethyl esters (LOVAZA) 1 g capsule Take 2 capsules (2 g total) by mouth 2 (two) times daily. 120 capsule 2   rosuvastatin (CRESTOR) 10 MG tablet TAKE ONE (1) TABLET EACH DAY 90 tablet 3   Vitamin D, Ergocalciferol, (DRISDOL) 1.25 MG (50000 UT) CAPS capsule Take 1 capsule (50,000 Units total) by mouth every 7 (seven) days. 12 capsule 3   Semaglutide (RYBELSUS) 3 MG TABS Take 6 mg by mouth daily. 30 tablet 0   No  current facility-administered medications for this visit.     Allergies (verified) Augmentin [amoxicillin-pot clavulanate], Codeine, Penicillins, Atorvastatin, and Oxycontin [oxycodone hcl]   PAST HISTORY  Family History Family History  Problem Relation Age of Onset   Stroke Father        Died age 65   CAD Father    CAD Brother [id number]       CABG    Social History Social History   Tobacco Use   Smoking status: Former    Packs/day: 0.00    Pack years: 0.00    Types: Cigarettes    Quit date: 01/17/2015    Years since quitting: 5.6   Smokeless tobacco: Never   Tobacco comments:    He has patches.  Substance Use Topics   Alcohol use: Yes    Comment: social  Are there smokers in your home (other than you)?  No  Risk Factors Current exercise habits: The patient does not participate in regular exercise at present.  Dietary issues discussed: carbohydrate restriction   Cardiac risk factors: advanced age (older than 16 for men, 86 for women), diabetes mellitus, dyslipidemia, family history of premature cardiovascular disease, hypertension, male gender, and obesity (BMI >= 30 kg/m2).  Depression Screen (Note: if answer to either of the following is "Yes", a more complete depression screening is indicated)   Q1: Over the past two weeks, have you felt down, depressed or hopeless? No  Q2: Over the past two weeks, have you felt little interest or pleasure in doing things? No  Have you lost interest or pleasure in daily life? No  Do you often feel hopeless? No  Do you cry easily over simple problems? No  Activities of Daily Living In your present state of health, do you have any difficulty performing the following activities?:  Driving? No Managing money?  No Feeding yourself? No Getting from bed to chair? No Climbing a flight of stairs? No Preparing food and eating?: No Bathing or showering? No Getting dressed: No Getting to the toilet? No Using the toilet:No Moving  around from place to place: No In the past year have you fallen or had a near fall?:No   Are you sexually active?  No  Do you have more than one partner?  No  Hearing Difficulties: Yes Do you often ask people to speak up or repeat themselves? Yes Do you experience ringing or noises in your ears? sometimes Do you have difficulty understanding soft or whispered voices? sometimes   Do you feel that you have a problem with memory? No  Do you often misplace items? No  Do you feel safe at home?  Yes  Cognitive Testing  Alert? Yes  Normal Appearance?Yes  Oriented to person? Yes  Place? Yes   Time? Yes  Recall of three objects?  Yes  Can perform simple calculations? Yes  Displays appropriate judgment?Yes  Can read the correct time from a watch face?Yes   Advanced Directives have been discussed with the patient? Yes   List the Names of Other Physician/Practitioners you currently use: 1.  Dr Ewing Schlein- GI, [place] 2.  Kandice Robinsons- Pulmonary/ sleep medicine 3.  Dr Despina Arias- Optometry  Indicate any recent Medical Services you may have received from other than [facility] providers in the past year (date may be approximate).  Immunization History  Administered Date(s) Administered   Influenza,inj,Quad PF,6+ Mos 12/29/2013, 12/30/2014, 02/14/2016, 01/16/2017, 01/24/2018, 03/09/2019   PFIZER(Purple Top)SARS-COV-2 Vaccination 10/23/2019   Pneumococcal Conjugate-13 08/22/2020   Pneumococcal Polysaccharide-23 03/12/2009   Tdap 12/30/2014   Zoster, Live 02/22/2012    Screening Tests Health Maintenance  Topic Date Due   FOOT EXAM  05/16/2019   OPHTHALMOLOGY EXAM  08/22/2020 (Originally 08/29/2018)   COVID-19 Vaccine (2 - Pfizer series) 09/07/2020 (Originally 11/13/2019)   Zoster Vaccines- Shingrix (1 of 2) 11/22/2020 (Originally 05/26/2005)   HIV Screening  08/22/2021 (Originally 05/27/1970)   INFLUENZA VACCINE  10/10/2020   HEMOGLOBIN A1C  02/17/2021   PNA vac Low Risk Adult (2 of 2 - PPSV23)  08/22/2021   TETANUS/TDAP  12/29/2024   COLONOSCOPY (Pts 45-81yrs Insurance coverage will need to be confirmed)  02/27/2028   Hepatitis C Screening  Completed   HPV VACCINES  Aged Out    All answers were reviewed with the patient and necessary referrals were made:  Delynn Flavin, DO  08/22/2020   History reviewed: allergies, current medications, past family history, past medical history, past social history, past surgical history, and problem list  Review of Systems Pertinent items noted in HPI and remainder of comprehensive ROS otherwise negative.    Objective:     Vision by Snellen chart: right eye: sees Dr Conley Rolls soon for DM eye exam , left GEZ:MOQHUTM declines measurement Blood pressure 121/79, pulse 73, temperature 97.6 F (36.4 C), height 5\' 11"  (1.803 m), weight 219 lb 9.6 oz (99.6 kg), SpO2 97 %. Body mass index is 30.63 kg/m.  BP 121/79   Pulse 73   Temp 97.6 F (36.4 C)   Ht 5\' 11"  (1.803 m)   Wt 219 lb 9.6 oz (99.6 kg)   SpO2 97%   BMI 30.63 kg/m  General appearance: alert, cooperative, appears stated age, no distress, and mildly obese Eyes: negative findings: lids and lashes normal, conjunctivae and sclerae normal, corneas clear, and pupils equal, round, reactive to light and accomodation Lungs: clear to auscultation bilaterally Chest wall: no tenderness Heart: regular rate and rhythm, S1, S2 normal, no murmur, click, rub or gallop Pulses: 2+ and symmetric  Diabetic Foot Exam - Simple   Simple Foot Form Diabetic Foot exam was performed with the following findings: Yes 08/22/2020  2:07 PM  Visual Inspection No deformities, no ulcerations, no other skin breakdown bilaterally: Yes Sensation Testing Intact to touch and monofilament testing bilaterally: Yes Pulse Check Posterior Tibialis and Dorsalis pulse intact bilaterally: Yes Comments        Assessment:     Welcome to Medicare preventive visit  Uncontrolled type 2 diabetes mellitus with  hyperglycemia, without long-term current use of insulin (HCC) - Plan: Semaglutide (RYBELSUS) 3 MG TABS  Hypertension associated with diabetes (HCC)  Hyperlipidemia associated with type 2 diabetes mellitus (HCC)  OSA on CPAP - Plan: Ambulatory referral to Pulmonology  Dysphagia, unspecified type - Plan: Ambulatory referral to Gastroenterology  Diabetes remains uncontrolled.  I advanced his Rybelsus to 6 mg daily.  I will CC to inform that I would like him to ultimately get to the 14 mg tablets.  He was also given 2-week supply of the Farxiga 10 mg tablets as he has not yet received his mail order supply from the manufacturer.  He understands he will not start the 08/24/2020 until he has completed the Jardiance that he has at home.  Blood pressure was well controlled.  He may continue current regimen  Continue statin and omega-3  I have referred him back to [facility] CPAP.  Unsure if he will require a new sleep study versus simply needs to get a new order.  Referral back to his gastroenterologist placed for dysphagia.  Question esophageal stricture that may require esophageal stretch      Plan:     During the course of the visit the patient was educated and counseled about appropriate screening and preventive services including:   Pneumococcal vaccine  Nutrition counseling  Advanced directives: has NO advanced directive  - add't info requested. Referral to SW: no.  I reviewed importance with him today and a copy to review and complete has been provided.  He will bring back at his next appointment for DM recheck Shingles vaccination discussed. It was >$130 today so he would like to hold off on it for now  Diet review for nutrition referral? declines   Patient Instructions (the written plan) was given to the patient.  Medicare Attestation I have personally reviewed:  The patient's medical and social history Their use of alcohol, tobacco or illicit drugs Their current  medications and supplements The patient's functional ability including ADLs,fall risks, home safety risks, cognitive, and hearing and visual impairment Diet and physical activities Evidence for depression or mood disorders  The patient's weight, height, BMI, and visual acuity have been recorded in the chart.  I have made referrals, counseling, and provided education to the patient based on review of the above and I have provided the patient with a written personalized care plan for preventive services.     Delynn Flavin, DO   08/22/2020

## 2020-08-22 NOTE — Patient Instructions (Signed)
Increase the Rybelsus to 6mg .  Will plan to get you up to 14mg  daily of rybelsus Continue jardiance and metformin.  See me in 3 months for sugar recheck.  Bring your Advance directive to next visit so we can scan it in.  Will see if can get you into the office for new CPAP machine.

## 2020-09-01 ENCOUNTER — Telehealth: Payer: Self-pay | Admitting: *Deleted

## 2020-09-01 NOTE — Telephone Encounter (Signed)
Patient aware that we received his Rybelsus patient assistance 4 boxes of 30 each.

## 2020-09-26 ENCOUNTER — Ambulatory Visit (INDEPENDENT_AMBULATORY_CARE_PROVIDER_SITE_OTHER): Payer: Medicare Other | Admitting: Family

## 2020-09-26 ENCOUNTER — Ambulatory Visit (INDEPENDENT_AMBULATORY_CARE_PROVIDER_SITE_OTHER): Payer: Medicare Other

## 2020-09-26 ENCOUNTER — Encounter: Payer: Self-pay | Admitting: Family

## 2020-09-26 ENCOUNTER — Other Ambulatory Visit: Payer: Self-pay

## 2020-09-26 VITALS — BP 120/80 | HR 83 | Temp 97.8°F | Ht 71.0 in | Wt 216.8 lb

## 2020-09-26 DIAGNOSIS — M545 Low back pain, unspecified: Secondary | ICD-10-CM | POA: Diagnosis not present

## 2020-09-26 DIAGNOSIS — S32020A Wedge compression fracture of second lumbar vertebra, initial encounter for closed fracture: Secondary | ICD-10-CM | POA: Diagnosis not present

## 2020-09-26 MED ORDER — BACLOFEN 10 MG PO TABS: 10.0000 mg | ORAL_TABLET | Freq: Three times a day (TID) | ORAL | 0 refills | Status: DC

## 2020-09-26 MED ORDER — DICLOFENAC SODIUM 75 MG PO TBEC: 75.0000 mg | DELAYED_RELEASE_TABLET | Freq: Two times a day (BID) | ORAL | 0 refills | Status: DC

## 2020-09-26 MED ORDER — HYDROCODONE-ACETAMINOPHEN 5-325 MG PO TABS: 1.0000 | ORAL_TABLET | Freq: Four times a day (QID) | ORAL | 0 refills | Status: DC | PRN

## 2020-09-26 NOTE — Patient Instructions (Addendum)
Spinal Compression Fracture A spinal compression fracture is a collapse of the bones that form the spine (vertebrae). With this type of fracture, the vertebrae become pushed (compressed) into a wedge shape. Most compression fractures happen in the middle or lower part of the spine. What are the causes? This condition may be caused by: Thinning and loss of density in the bones (osteoporosis). This is the most common cause. A fall. A car or motorcycle accident. Cancer. Trauma, such as a heavy, direct hit to the head or back. What increases the risk? You are more likely to develop this condition if: You are 60 years of age or older. You have osteoporosis. You have certain types of cancer, including: Multiple myeloma. Lymphoma. Prostate cancer. Lung cancer. Breast cancer. What are the signs or symptoms? Symptoms of this condition include: Severe pain with simple movements such as coughing or sneezing. Pain that gets worse over time. Pain that is worse when you stand, walk, sit, or bend. Sudden pain that is so bad that it is hard for you to move. Bending or humping of the spine. Gradual loss of height. Numbness, tingling, or weakness in the back and legs. Trouble walking. Your symptoms will depend on the cause of the fracture and how quickly it develops. How is this diagnosed? This condition may be diagnosed based on symptoms, medical history, and a physical exam. During the physical exam, your health care provider may tap along the length of your spine to check for tenderness. Tests may be done to confirm the diagnosis. They may include: A bone mineral density test to check for osteoporosis. Imaging tests, such as a spine X-ray, CT scan, or MRI. How is this treated? Treatment depends on the cause and severity of the condition. Some fractures may heal on their own with supportive care. Treatment may include: Pain medicine. Rest. A back brace. Physical therapy exercises. Medicine to  strengthen bone. Calcium and vitamin D supplements. Fractures that cause the back to become misshapen, cause nerve pain or weakness, or do not respond to other treatment may be treated with surgery. This may include: Vertebroplasty. Bone cement is injected into the collapsed vertebrae to stabilize them. Balloon kyphoplasty. The collapsed vertebrae are expanded with a balloon and then bone cement is injected into them. Spinal fusion. The collapsed vertebrae are connected (fused) to normal vertebrae. Follow these instructions at home: Medicines Take over-the-counter and prescription medicines only as told by your health care provider. Ask your health care provider if the medicine prescribed to you: Requires you to avoid driving or using machinery. Can cause constipation. You may need to take these actions to prevent or treat constipation: Drink enough fluid to keep your urine pale yellow. Take over-the-counter or prescription medicines. Eat foods that are high in fiber, such as beans, whole grains, and fresh fruits and vegetables. Limit foods that are high in fat and processed sugars, such as fried or sweet foods. If you have a brace: Wear the brace as told by your health care provider. Remove it only as told by your health care provider. Loosen the brace if your fingers or toes tingle, become numb, or turn cold and blue. Keep the brace clean. If the brace is not waterproof: Do not let it get wet. Cover it with a watertight covering when you take a bath or a shower. Managing pain, stiffness, and swelling  If directed, put ice on the injured area. To do this: If you have a removable brace, remove it as told   by your health care provider. Put ice in a plastic bag. Place a towel between your skin and the bag. Leave the ice on for 20 minutes, 2-3 times a day. Remove the ice if your skin turns bright red. This is very important. If you cannot feel pain, heat, or cold, you have a greater risk of  damage to the area. Activity Rest as told by your health care provider. Avoid sitting for a long time without moving. Get up to take short walks every 1-2 hours. This is important to improve blood flow and breathing. Ask for help if you feel weak or unsteady. Return to your normal activities as told by your health care provider. Ask what activities are safe for you. Do physical therapy exercises to improve movement and strength in your back, as recommended by your health care provider. Exercise regularly as directed by your health care provider. General instructions  Do not drink alcohol. Alcohol can interfere with your treatment. Do not use any products that contain nicotine or tobacco, such as cigarettes, e-cigarettes, and chewing tobacco. These can delay bone healing. If you need help quitting, ask your health care provider. Keep all follow-up visits. This is important. It can help to prevent permanent injury, disability, and long-lasting (chronic) pain. Contact a health care provider if: You have a fever. Your pain medicine is not helping. Your pain does not get better over time. You cannot return to your normal activities as planned or expected. Get help right away if: Your pain is very bad and it suddenly gets worse. You are unable to move any body part (paralysis) that is below the level of your injury. You have numbness, tingling, or weakness in any body part that is below the level of your injury. You cannot control your bladder or bowels. Summary A spinal compression fracture is a collapse of the bones that form the spine (vertebrae). With this type of fracture, the vertebrae become pushed (compressed) into a wedge shape. Your symptoms and treatment will depend on the cause and severity of the fracture and how quickly it develops. Some fractures may heal on their own with supportive care. Fractures that cause the back to become misshapen, cause nerve pain or weakness, or do not  respond to other treatment may be treated with surgery. This information is not intended to replace advice given to you by your health care provider. Make sure you discuss any questions you have with your health care provider. Document Revised: 06/17/2019 Document Reviewed: 06/17/2019 Elsevier Patient Education  2022 [employer]  

## 2020-09-26 NOTE — Progress Notes (Signed)
Subjective:    Patient ID: Scott [name], male    DOB: 10-16-1955, 65 y.o.   MRN: [id number]  Chief Complaint  Patient presents with   Back Pain    Tail bone from fall 2 weeks ago    Pt presents to the office today with pain of his cocyx. States two weeks ago he tripped over a tool box and fell on his buttocks. He reports he thought he was getting better and had been "taking it easy". However, over the last few days he has had increased pain. He reports his pain is a 9 out 10 when standing and sitting. He reports his pain is improved when laying down flat in his bed.  Back Pain This is a new problem. The current episode started 1 to 4 weeks ago. The pain is present in the gluteal. The quality of the pain is described as aching. The pain is at a severity of 9/10. The pain is moderate. Pertinent negatives include no dysuria, fever, headaches, paresthesias, tingling or weakness. He has tried NSAIDs, bed rest, heat and ice for the symptoms. The treatment provided mild relief.     Review of Systems  Constitutional:  Negative for fever.  Genitourinary:  Negative for dysuria.  Musculoskeletal:  Positive for back pain.  Neurological:  Negative for tingling, weakness, headaches and paresthesias.  All other systems reviewed and are negative.     Objective:   Physical Exam Vitals reviewed.  Constitutional:      General: He is not in acute distress.    Appearance: He is well-developed.  HENT:     Head: Normocephalic.     Right Ear: Tympanic membrane normal.     Left Ear: Tympanic membrane normal.  Eyes:     General:        Right eye: No discharge.        Left eye: No discharge.     Pupils: Pupils are equal, round, and reactive to light.  Neck:     Thyroid: No thyromegaly.  Cardiovascular:     Rate and Rhythm: Normal rate and regular rhythm.     Heart sounds: Normal heart sounds. No murmur heard. Pulmonary:     Effort: Pulmonary effort is normal. No respiratory distress.      Breath sounds: Normal breath sounds. No wheezing.  Abdominal:     General: Bowel sounds are normal. There is no distension.     Palpations: Abdomen is soft.     Tenderness: There is no abdominal tenderness.  Musculoskeletal:        General: Tenderness present.     Cervical back: Normal range of motion and neck supple.     Comments: Pt grimacing in pain, pain in lumbar with standing and sitting.    Skin:    General: Skin is warm and dry.     Findings: No erythema or rash.  Neurological:     Mental Status: He is alert and oriented to person, place, and time.     Cranial Nerves: No cranial nerve deficit.     Deep Tendon Reflexes: Reflexes are normal and symmetric.  Psychiatric:        Behavior: Behavior normal.        Thought Content: Thought content normal.        Judgment: Judgment normal.     BP 120/80   Pulse 83   Temp 97.8 F (36.6 C) (Temporal)   Ht 5\' 11"  (1.803 m)   Wt 216 lb 12.8  oz (98.3 kg)   SpO2 98%   BMI 30.24 kg/m       Assessment & Plan:  Scott [name] comes in today with chief complaint of Back Pain (Tail bone from fall 2 weeks ago )   Diagnosis and orders addressed:  1. Acute midline low back pain without sciatica - DG Lumbar Spine 2-3 Views; Future - baclofen (LIORESAL) 10 MG tablet; Take 1 tablet (10 mg total) by mouth 3 (three) times daily.  Dispense: 30 each; Refill: 0 - diclofenac (VOLTAREN) 75 MG EC tablet; Take 1 tablet (75 mg total) by mouth 2 (two) times daily.  Dispense: 30 tablet; Refill: 0 - Ambulatory referral to Neurosurgery - HYDROcodone-acetaminophen (NORCO) 5-325 MG tablet; Take 1 tablet by mouth every 6 (six) hours as needed for moderate pain.  Dispense: 30 tablet; Refill: 0  2. Closed compression fracture of L2 vertebra, initial encounter (HCC) - MR Lumbar Spine Wo Contrast; Future - Ambulatory referral to Neurosurgery - HYDROcodone-acetaminophen (NORCO) 5-325 MG tablet; Take 1 tablet by mouth every 6 (six) hours as needed for  moderate pain.  Dispense: 30 tablet; Refill: 0  Pt is a great deal of pain today. Will given a short course of Norco to help. No further refills. Pt reviewed in [place] controlled database, no red flags.  Sedation precautions discussed.  MRI pending  Neurosurgereon  pending  Jannifer Rodney, FNP

## 2020-09-30 ENCOUNTER — Ambulatory Visit (HOSPITAL_COMMUNITY)
Admission: RE | Admit: 2020-09-30 | Discharge: 2020-09-30 | Disposition: A | Discharge status: Home / Self Care | Payer: Medicare Other | Source: Ambulatory Visit | Attending: Family | Admitting: Family

## 2020-09-30 ENCOUNTER — Telehealth: Payer: Self-pay | Admitting: Family Medicine

## 2020-09-30 ENCOUNTER — Other Ambulatory Visit: Payer: Self-pay

## 2020-09-30 DIAGNOSIS — S32020A Wedge compression fracture of second lumbar vertebra, initial encounter for closed fracture: Secondary | ICD-10-CM | POA: Diagnosis not present

## 2020-09-30 DIAGNOSIS — M5126 Other intervertebral disc displacement, lumbar region: Secondary | ICD-10-CM | POA: Diagnosis not present

## 2020-09-30 DIAGNOSIS — M5136 Other intervertebral disc degeneration, lumbar region: Secondary | ICD-10-CM | POA: Diagnosis not present

## 2020-09-30 DIAGNOSIS — M48061 Spinal stenosis, lumbar region without neurogenic claudication: Secondary | ICD-10-CM | POA: Diagnosis not present

## 2020-09-30 DIAGNOSIS — M545 Low back pain, unspecified: Secondary | ICD-10-CM

## 2020-09-30 DIAGNOSIS — S32010A Wedge compression fracture of first lumbar vertebra, initial encounter for closed fracture: Secondary | ICD-10-CM | POA: Diagnosis not present

## 2020-09-30 MED ORDER — HYDROCODONE-ACETAMINOPHEN 5-325 MG PO TABS: 1.0000 | ORAL_TABLET | Freq: Four times a day (QID) | ORAL | 0 refills | Status: DC | PRN

## 2020-09-30 MED ORDER — HYDROCODONE-ACETAMINOPHEN 5-325 MG PO TABS: 1.0000 | ORAL_TABLET | ORAL | 0 refills | Status: DC | PRN

## 2020-09-30 NOTE — Telephone Encounter (Signed)
New rx sent pharmacy.

## 2020-09-30 NOTE — Telephone Encounter (Signed)
I have sent a new prescription of Norco. Keep MRI and Neurosurgereon appt.

## 2020-09-30 NOTE — Addendum Note (Signed)
Addended by: Jannifer Rodney A on: 09/30/2020 03:18 PM   Modules accepted: Orders

## 2020-09-30 NOTE — Telephone Encounter (Signed)
Didn't change rx and pharmacy will not let him pick up he needs it stronger 1 q 6 houre is not touching it

## 2020-10-02 NOTE — Progress Notes (Deleted)
10/03/20- 65 yoM former smoker (30 pkyr), coming to establish with me for OSA, Insomnia,  courtesy of Delynn Flavin, DO Medical problem list includes HTN, Asbestosis, GERD, Chronic Cholecystitis, Hyperlipidemia, DM2, Cervical DDD, BPH, Back Pain/ L2 vert fx w sciatica,   Previously followed by Dr Clayborn Bigness, last seen by NP 2018, compliant with CPAP auto 8-15 Epworth score- Body weight today- Covid vax-

## 2020-10-03 ENCOUNTER — Institutional Professional Consult (permissible substitution): Payer: Medicare Other | Admitting: Internal Medicine

## 2020-10-03 ENCOUNTER — Telehealth: Payer: Self-pay | Admitting: Family Medicine

## 2020-10-03 NOTE — Telephone Encounter (Signed)
See result note.  

## 2020-10-05 ENCOUNTER — Other Ambulatory Visit: Payer: Self-pay | Admitting: Neurological Surgery

## 2020-10-05 DIAGNOSIS — S32010B Wedge compression fracture of first lumbar vertebra, initial encounter for open fracture: Secondary | ICD-10-CM | POA: Diagnosis not present

## 2020-10-06 NOTE — Pre-Procedure Instructions (Signed)
Scott [name]  10/06/2020    Your procedure is scheduled on Monday, October 10, 2020 at 7:30 AM.   Report to [facility] Entrance "A" Admitting Office  at 5:30 AM.   Call this number if you have problems the morning of surgery: [phone]    Remember:  Do not eat or drink after midnight Sunday, 10/09/20.  Take these medicines the morning of surgery with A SIP OF WATER: Rosuvastatin (Crestor), Flonase nasal spray, eye drops, Hydrocodone (Norco) - if needed, Loratadine (Claritin) - if needed, Albuterol (Ventolin) inhaler - if needed (please bring inhaler with you day of surgery.  Stop Aspirin as instructed by surgeon or physician. Stop Vitamins, NSAIDS (Diclofenac, Aleve, Ibuprofen, etc) and Fish Oil as of today prior to surgery. Do not use other Aspirin containing products or Herbal medications as of today prior to surgery.  WHAT DO I DO ABOUT MY DIABETES MEDICATION?  Do not take Marcelline Deist the day prior to surgery and the day of surgery.  Do not take oral diabetes medicines (pills) the morning of surgery. (Metformin and Rybelsus)   HOW TO MANAGE YOUR DIABETES BEFORE AND AFTER SURGERY  Why is it important to control my blood sugar before and after surgery? Improving blood sugar levels before and after surgery helps healing and can limit problems. A way of improving blood sugar control is eating a healthy diet by:  Eating less sugar and carbohydrates  Increasing activity/exercise  Talking with your doctor about reaching your blood sugar goals High blood sugars (greater than 180 mg/dL) can raise your risk of infections and slow your recovery, so you will need to focus on controlling your diabetes during the weeks before surgery. Make sure that the doctor who takes care of your diabetes knows about your planned surgery including the date and location.  How do I manage my blood sugar before surgery? Check your blood sugar at least 4 times a day, starting 2 days before surgery, to  make sure that the level is not too high or low.  Check your blood sugar the morning of your surgery when you wake up and every 2 hours until you get to the Short Stay unit.  If your blood sugar is less than 70 mg/dL, you will need to treat for low blood sugar: Do not take insulin. Treat a low blood sugar (less than 70 mg/dL) with  cup of clear juice (cranberry or apple), 4 glucose tablets, OR glucose gel. Recheck blood sugar in 15 minutes after treatment (to make sure it is greater than 70 mg/dL). If your blood sugar is not greater than 70 mg/dL on recheck, call [phone] for further instructions. Report your blood sugar to the short stay nurse when you get to Short Stay.  If you are admitted to the hospital after surgery: Your blood sugar will be checked by the staff and you will probably be given insulin after surgery (instead of oral diabetes medicines) to make sure you have good blood sugar levels. The goal for blood sugar control after surgery is 80-180 mg/dL.     Do not wear jewelry.  Do not wear lotions, powders, cologne or deodorant.  Men may shave face and neck.  Do not bring valuables to the hospital.  [facility] is not responsible for any belongings or valuables.  Contacts, dentures or bridgework may not be worn into surgery.  Leave your suitcase in the car.  After surgery it may be brought to your room.  For patients admitted to  the hospital, discharge time will be determined by your treatment team.  [facility] - Preparing for Surgery  Before surgery, you can play an important role.  Because skin is not sterile, your skin needs to be as free of germs as possible.  You can reduce the number of germs on you skin by washing with CHG (chlorahexidine gluconate) soap before surgery.  CHG is an antiseptic cleaner which kills germs and bonds with the skin to continue killing germs even after washing.  Oral Hygiene is also important in reducing the risk of infection.  Remember to  brush your teeth with your regular toothpaste the morning of surgery.  Please DO NOT use if you have an allergy to CHG or antibacterial soaps.  If your skin becomes reddened/irritated stop using the CHG and inform your nurse when you arrive at Short Stay.  Do not shave (including legs and underarms) for at least 48 hours prior to the first CHG shower.  You may shave your face.  Please follow these instructions carefully:   1.  Shower with CHG Soap the night before surgery and the morning of Surgery.  2.  If you choose to wash your hair, wash your hair first as usual with your normal shampoo.  3.  After you shampoo, rinse your hair and body thoroughly to remove the shampoo. 4.  Use CHG as you would any other liquid soap.  You can apply chg directly to the skin and wash gently with a      scrungie or washcloth.           5.  Apply the CHG Soap to your body ONLY FROM THE NECK DOWN.   Do not use on open wounds or open sores. Avoid contact with your eyes, ears, mouth and genitals (private parts).  Wash genitals (private parts) with your normal soap, do this prior to using CHG soap.  6.  Wash thoroughly, paying special attention to the area where your surgery will be performed.  7.  Thoroughly rinse your body with warm water from the neck down.  8.  DO NOT shower/wash with your normal soap after using and rinsing off the CHG Soap.  9.  Pat yourself dry with a clean towel.            10.  Wear clean pajamas.            11.  Place clean sheets on your bed the night of your first shower and do not sleep with pets.  Day of Surgery  Shower as above. Do not apply any lotions/deodorants the morning of surgery.   Please wear clean clothes to the hospital/surgery center. Remember to brush your teeth with toothpaste.  Please read over the fact sheets that you were given.

## 2020-10-07 ENCOUNTER — Encounter (HOSPITAL_COMMUNITY): Payer: Self-pay

## 2020-10-07 ENCOUNTER — Other Ambulatory Visit: Payer: Self-pay

## 2020-10-07 ENCOUNTER — Encounter (HOSPITAL_COMMUNITY)
Admission: RE | Admit: 2020-10-07 | Discharge: 2020-10-07 | Disposition: A | Discharge status: Home / Self Care | Payer: Medicare Other | Source: Ambulatory Visit | Attending: Neurological Surgery | Admitting: Neurological Surgery

## 2020-10-07 DIAGNOSIS — Z20822 Contact with and (suspected) exposure to covid-19: Secondary | ICD-10-CM | POA: Diagnosis not present

## 2020-10-07 DIAGNOSIS — Z01818 Encounter for other preprocedural examination: Secondary | ICD-10-CM | POA: Diagnosis not present

## 2020-10-07 LAB — GLUCOSE, CAPILLARY: Glucose-Capillary: 160 mg/dL — ABNORMAL HIGH (ref 70–99)

## 2020-10-07 LAB — BASIC METABOLIC PANEL
Anion gap: 10 (ref 5–15)
BUN: 17 mg/dL (ref 8–23)
CO2: 26 mmol/L (ref 22–32)
Calcium: 9.5 mg/dL (ref 8.9–10.3)
Chloride: 101 mmol/L (ref 98–111)
Creatinine, Ser: 0.9 mg/dL (ref 0.61–1.24)
GFR, Estimated: >60 mL/min (ref >60)
Glucose, Bld: 137 mg/dL — ABNORMAL HIGH (ref 70–99)
Potassium: 4.8 mmol/L (ref 3.5–5.1)
Sodium: 137 mmol/L (ref 135–145)

## 2020-10-07 LAB — SARS CORONAVIRUS 2 (TAT 6-24 HRS): SARS Coronavirus 2: NEGATIVE

## 2020-10-07 NOTE — Progress Notes (Addendum)
PCP - Dr. Nadine Counts  EKG - today Stress Test - 11/30/15  Sleep Study - years ago CPAP - yes  Fasting Blood Sugar - 120-130 Checks Blood Sugar ____1_ times a day  Aspirin Instructions: Pt states Dr. Danielle Dess told him to continue   COVID TEST- done today in PAT.   Anesthesia review: No  Patient denies shortness of breath, fever, cough and chest pain at PAT appointment   All instructions explained to the patient, with a verbal understanding of the material. Patient agrees to go over the instructions while at home for a better understanding. Patient also instructed to wear mask after being tested for COVID-19. The opportunity to ask questions was provided.

## 2020-10-10 ENCOUNTER — Ambulatory Visit (HOSPITAL_COMMUNITY): Payer: Medicare Other | Admitting: Anesthesiology

## 2020-10-10 ENCOUNTER — Ambulatory Visit (HOSPITAL_COMMUNITY): Payer: Medicare Other | Admitting: Vascular Surgery

## 2020-10-10 ENCOUNTER — Encounter (HOSPITAL_COMMUNITY): Payer: Self-pay | Admitting: Neurological Surgery

## 2020-10-10 ENCOUNTER — Ambulatory Visit (HOSPITAL_COMMUNITY)
Admission: RE | Admit: 2020-10-10 | Discharge: 2020-10-10 | Disposition: A | Discharge status: Home / Self Care | Payer: Medicare Other | Attending: Neurological Surgery | Admitting: Neurological Surgery

## 2020-10-10 ENCOUNTER — Ambulatory Visit (HOSPITAL_COMMUNITY): Payer: Medicare Other

## 2020-10-10 ENCOUNTER — Encounter (HOSPITAL_COMMUNITY)
Admission: RE | Disposition: A | Discharge status: Home / Self Care | Payer: Self-pay | Source: Home / Self Care | Attending: Neurological Surgery

## 2020-10-10 DIAGNOSIS — Z9889 Other specified postprocedural states: Secondary | ICD-10-CM | POA: Diagnosis not present

## 2020-10-10 DIAGNOSIS — E559 Vitamin D deficiency, unspecified: Secondary | ICD-10-CM | POA: Diagnosis not present

## 2020-10-10 DIAGNOSIS — Z880 Allergy status to penicillin: Secondary | ICD-10-CM | POA: Insufficient documentation

## 2020-10-10 DIAGNOSIS — Z888 Allergy status to other drugs, medicaments and biological substances status: Secondary | ICD-10-CM | POA: Insufficient documentation

## 2020-10-10 DIAGNOSIS — Z7951 Long term (current) use of inhaled steroids: Secondary | ICD-10-CM | POA: Diagnosis not present

## 2020-10-10 DIAGNOSIS — Z7984 Long term (current) use of oral hypoglycemic drugs: Secondary | ICD-10-CM | POA: Insufficient documentation

## 2020-10-10 DIAGNOSIS — I10 Essential (primary) hypertension: Secondary | ICD-10-CM | POA: Diagnosis not present

## 2020-10-10 DIAGNOSIS — S32020B Wedge compression fracture of second lumbar vertebra, initial encounter for open fracture: Secondary | ICD-10-CM | POA: Diagnosis not present

## 2020-10-10 DIAGNOSIS — Z885 Allergy status to narcotic agent status: Secondary | ICD-10-CM | POA: Diagnosis not present

## 2020-10-10 DIAGNOSIS — W1789XA Other fall from one level to another, initial encounter: Secondary | ICD-10-CM | POA: Diagnosis not present

## 2020-10-10 DIAGNOSIS — Z7982 Long term (current) use of aspirin: Secondary | ICD-10-CM | POA: Diagnosis not present

## 2020-10-10 DIAGNOSIS — Z87891 Personal history of nicotine dependence: Secondary | ICD-10-CM | POA: Diagnosis not present

## 2020-10-10 DIAGNOSIS — Z79899 Other long term (current) drug therapy: Secondary | ICD-10-CM | POA: Insufficient documentation

## 2020-10-10 DIAGNOSIS — S32029A Unspecified fracture of second lumbar vertebra, initial encounter for closed fracture: Secondary | ICD-10-CM | POA: Insufficient documentation

## 2020-10-10 DIAGNOSIS — Z419 Encounter for procedure for purposes other than remedying health state, unspecified: Secondary | ICD-10-CM

## 2020-10-10 DIAGNOSIS — S32020A Wedge compression fracture of second lumbar vertebra, initial encounter for closed fracture: Secondary | ICD-10-CM | POA: Diagnosis not present

## 2020-10-10 HISTORY — PX: KYPHOPLASTY: SHX5884

## 2020-10-10 LAB — GLUCOSE, CAPILLARY
Glucose-Capillary: 135 mg/dL — ABNORMAL HIGH (ref 70–99)
Glucose-Capillary: 131 mg/dL — ABNORMAL HIGH (ref 70–99)

## 2020-10-10 SURGERY — KYPHOPLASTY
Anesthesia: General | Site: Spine Lumbar

## 2020-10-10 MED ORDER — SUCCINYLCHOLINE CHLORIDE 200 MG/10ML IV SOSY
PREFILLED_SYRINGE | INTRAVENOUS | Status: AC
  Filled 2020-10-10: qty 10

## 2020-10-10 MED ORDER — LIDOCAINE 2% (20 MG/ML) 5 ML SYRINGE
INTRAMUSCULAR | Status: AC
  Filled 2020-10-10: qty 5

## 2020-10-10 MED ORDER — SENNA 8.6 MG PO TABS: 1.0000 | ORAL_TABLET | Freq: Two times a day (BID) | ORAL | Status: DC

## 2020-10-10 MED ORDER — PROPOFOL 10 MG/ML IV BOLUS
INTRAVENOUS | Status: DC | PRN
  Administered 2020-10-10: 160 mg via INTRAVENOUS

## 2020-10-10 MED ORDER — ACETAMINOPHEN 325 MG PO TABS: 325.0000 mg | ORAL_TABLET | ORAL | Status: DC | PRN

## 2020-10-10 MED ORDER — MENTHOL 3 MG MT LOZG: 1.0000 | LOZENGE | OROMUCOSAL | Status: DC | PRN

## 2020-10-10 MED ORDER — FENTANYL CITRATE (PF) 100 MCG/2ML IJ SOLN
INTRAMUSCULAR | Status: AC
  Filled 2020-10-10: qty 2

## 2020-10-10 MED ORDER — ORAL CARE MOUTH RINSE: 15.0000 mL | Freq: Once | OROMUCOSAL | Status: AC

## 2020-10-10 MED ORDER — DOCUSATE SODIUM 100 MG PO CAPS: 100.0000 mg | ORAL_CAPSULE | Freq: Two times a day (BID) | ORAL | Status: DC

## 2020-10-10 MED ORDER — ONDANSETRON HCL 4 MG/2ML IJ SOLN
INTRAMUSCULAR | Status: DC | PRN
Start: 2020-10-10 — End: 2020-10-10
  Administered 2020-10-10: 4 mg via INTRAVENOUS

## 2020-10-10 MED ORDER — HYDROCODONE-ACETAMINOPHEN 5-325 MG PO TABS: 1.0000 | ORAL_TABLET | ORAL | Status: DC | PRN

## 2020-10-10 MED ORDER — EPHEDRINE 5 MG/ML INJ
INTRAVENOUS | Status: AC
  Filled 2020-10-10: qty 5

## 2020-10-10 MED ORDER — ACETAMINOPHEN 160 MG/5ML PO SOLN: 325.0000 mg | ORAL | Status: DC | PRN

## 2020-10-10 MED ORDER — IOPAMIDOL (ISOVUE-300) INJECTION 61%
INTRAVENOUS | Status: DC | PRN
  Administered 2020-10-10: 50 mL

## 2020-10-10 MED ORDER — LACTATED RINGERS IV SOLN: INTRAVENOUS | Status: DC

## 2020-10-10 MED ORDER — LIDOCAINE-EPINEPHRINE 1 %-1:100000 IJ SOLN
INTRAMUSCULAR | Status: DC | PRN
  Administered 2020-10-10: 5 mL

## 2020-10-10 MED ORDER — ACETAMINOPHEN 650 MG RE SUPP: 650.0000 mg | RECTAL | Status: DC | PRN

## 2020-10-10 MED ORDER — CHLORHEXIDINE GLUCONATE CLOTH 2 % EX PADS: 6.0000 | MEDICATED_PAD | Freq: Once | CUTANEOUS | Status: DC

## 2020-10-10 MED ORDER — 0.9 % SODIUM CHLORIDE (POUR BTL) OPTIME
TOPICAL | Status: DC | PRN
  Administered 2020-10-10: 1000 mL

## 2020-10-10 MED ORDER — PROMETHAZINE HCL 25 MG/ML IJ SOLN: 6.2500 mg | INTRAMUSCULAR | Status: DC | PRN

## 2020-10-10 MED ORDER — ROCURONIUM BROMIDE 10 MG/ML (PF) SYRINGE
PREFILLED_SYRINGE | INTRAVENOUS | Status: AC
  Filled 2020-10-10: qty 10

## 2020-10-10 MED ORDER — BUPIVACAINE HCL (PF) 0.5 % IJ SOLN
INTRAMUSCULAR | Status: DC | PRN
  Administered 2020-10-10: 5 mL

## 2020-10-10 MED ORDER — SODIUM CHLORIDE 0.9% FLUSH: 3.0000 mL | Freq: Two times a day (BID) | INTRAVENOUS | Status: DC

## 2020-10-10 MED ORDER — POLYETHYLENE GLYCOL 3350 17 G PO PACK: 17.0000 g | PACK | Freq: Every day | ORAL | Status: DC | PRN

## 2020-10-10 MED ORDER — DEXAMETHASONE SODIUM PHOSPHATE 10 MG/ML IJ SOLN
INTRAMUSCULAR | Status: DC | PRN
  Administered 2020-10-10: 10 mg via INTRAVENOUS

## 2020-10-10 MED ORDER — CEFAZOLIN SODIUM 1 G IJ SOLR
INTRAMUSCULAR | Status: AC
  Filled 2020-10-10: qty 20

## 2020-10-10 MED ORDER — ACETAMINOPHEN 325 MG PO TABS: 650.0000 mg | ORAL_TABLET | ORAL | Status: DC | PRN

## 2020-10-10 MED ORDER — FENTANYL CITRATE (PF) 250 MCG/5ML IJ SOLN
INTRAMUSCULAR | Status: DC | PRN
  Administered 2020-10-10: 100 ug via INTRAVENOUS
  Administered 2020-10-10 (×3): 50 ug via INTRAVENOUS

## 2020-10-10 MED ORDER — ACETAMINOPHEN 10 MG/ML IV SOLN
INTRAVENOUS | Status: AC
  Filled 2020-10-10: qty 100

## 2020-10-10 MED ORDER — PHENYLEPHRINE 40 MCG/ML (10ML) SYRINGE FOR IV PUSH (FOR BLOOD PRESSURE SUPPORT)
PREFILLED_SYRINGE | INTRAVENOUS | Status: AC
  Filled 2020-10-10: qty 10

## 2020-10-10 MED ORDER — VANCOMYCIN HCL IN DEXTROSE 1-5 GM/200ML-% IV SOLN: 1000.0000 mg | INTRAVENOUS | Status: DC

## 2020-10-10 MED ORDER — SODIUM CHLORIDE 0.9 % IV SOLN: 250.0000 mL | INTRAVENOUS | Status: DC

## 2020-10-10 MED ORDER — ROCURONIUM BROMIDE 10 MG/ML (PF) SYRINGE
PREFILLED_SYRINGE | INTRAVENOUS | Status: DC | PRN
  Administered 2020-10-10: 60 mg via INTRAVENOUS

## 2020-10-10 MED ORDER — AMISULPRIDE (ANTIEMETIC) 5 MG/2ML IV SOLN: 10.0000 mg | Freq: Once | INTRAVENOUS | Status: DC | PRN

## 2020-10-10 MED ORDER — FLEET ENEMA 7-19 GM/118ML RE ENEM: 1.0000 | ENEMA | Freq: Once | RECTAL | Status: DC | PRN

## 2020-10-10 MED ORDER — SODIUM CHLORIDE 0.9% FLUSH: 3.0000 mL | INTRAVENOUS | Status: DC | PRN

## 2020-10-10 MED ORDER — FENTANYL CITRATE (PF) 100 MCG/2ML IJ SOLN
25.0000 ug | INTRAMUSCULAR | Status: DC | PRN
  Administered 2020-10-10 (×2): 50 ug via INTRAVENOUS

## 2020-10-10 MED ORDER — ONDANSETRON HCL 4 MG/2ML IJ SOLN: 4.0000 mg | Freq: Four times a day (QID) | INTRAMUSCULAR | Status: DC | PRN

## 2020-10-10 MED ORDER — ONDANSETRON HCL 4 MG PO TABS: 4.0000 mg | ORAL_TABLET | Freq: Four times a day (QID) | ORAL | Status: DC | PRN

## 2020-10-10 MED ORDER — ALBUTEROL SULFATE HFA 108 (90 BASE) MCG/ACT IN AERS: 2.0000 | INHALATION_SPRAY | Freq: Four times a day (QID) | RESPIRATORY_TRACT | Status: DC | PRN

## 2020-10-10 MED ORDER — MIDAZOLAM HCL 2 MG/2ML IJ SOLN
INTRAMUSCULAR | Status: DC | PRN
Start: 2020-10-10 — End: 2020-10-10
  Administered 2020-10-10: 2 mg via INTRAVENOUS

## 2020-10-10 MED ORDER — ACETAMINOPHEN 10 MG/ML IV SOLN
1000.0000 mg | Freq: Once | INTRAVENOUS | Status: DC | PRN
  Administered 2020-10-10: 1000 mg via INTRAVENOUS

## 2020-10-10 MED ORDER — BISACODYL 10 MG RE SUPP: 10.0000 mg | Freq: Every day | RECTAL | Status: DC | PRN

## 2020-10-10 MED ORDER — LIDOCAINE-EPINEPHRINE 1 %-1:100000 IJ SOLN
INTRAMUSCULAR | Status: AC
  Filled 2020-10-10: qty 1

## 2020-10-10 MED ORDER — CHLORHEXIDINE GLUCONATE 0.12 % MT SOLN
OROMUCOSAL | Status: AC
  Administered 2020-10-10: 15 mL via OROMUCOSAL
  Filled 2020-10-10: qty 15

## 2020-10-10 MED ORDER — CHLORHEXIDINE GLUCONATE 0.12 % MT SOLN: 15.0000 mL | Freq: Once | OROMUCOSAL | Status: AC

## 2020-10-10 MED ORDER — MIDAZOLAM HCL 2 MG/2ML IJ SOLN
INTRAMUSCULAR | Status: AC
  Filled 2020-10-10: qty 2

## 2020-10-10 MED ORDER — PHENOL 1.4 % MT LIQD: 1.0000 | OROMUCOSAL | Status: DC | PRN

## 2020-10-10 MED ORDER — BUPIVACAINE HCL (PF) 0.5 % IJ SOLN
INTRAMUSCULAR | Status: AC
  Filled 2020-10-10: qty 30

## 2020-10-10 MED ORDER — PROPOFOL 10 MG/ML IV BOLUS
INTRAVENOUS | Status: AC
  Filled 2020-10-10: qty 20

## 2020-10-10 MED ORDER — CEFAZOLIN SODIUM-DEXTROSE 2-3 GM-%(50ML) IV SOLR
INTRAVENOUS | Status: DC | PRN
  Administered 2020-10-10: 2 g via INTRAVENOUS

## 2020-10-10 MED ORDER — LIDOCAINE 2% (20 MG/ML) 5 ML SYRINGE
INTRAMUSCULAR | Status: DC | PRN
  Administered 2020-10-10: 40 mg via INTRAVENOUS

## 2020-10-10 MED ORDER — FENTANYL CITRATE (PF) 250 MCG/5ML IJ SOLN
INTRAMUSCULAR | Status: AC
  Filled 2020-10-10: qty 5

## 2020-10-10 SURGICAL SUPPLY — 39 items
ADH SKN CLS APL DERMABOND .7 (GAUZE/BANDAGES/DRESSINGS) ×1
BAG COUNTER SPONGE SURGICOUNT (BAG) ×2 IMPLANT
BAG SPNG CNTER NS LX DISP (BAG) ×1
BLADE CLIPPER SURG (BLADE) IMPLANT
BLADE SURG 11 STRL SS (BLADE) ×2 IMPLANT
BNDG ADH 1X3 SHEER STRL LF (GAUZE/BANDAGES/DRESSINGS) ×8 IMPLANT
BNDG ADH THN 3X1 STRL LF (GAUZE/BANDAGES/DRESSINGS) ×4
CEMENT BONE KYPHX HV R (Orthopedic Implant) ×1 IMPLANT
CONT SPEC 4OZ CLIKSEAL STRL BL (MISCELLANEOUS) ×2 IMPLANT
DECANTER SPIKE VIAL GLASS SM (MISCELLANEOUS) ×2 IMPLANT
DERMABOND ADVANCED (GAUZE/BANDAGES/DRESSINGS) ×1
DERMABOND ADVANCED .7 DNX12 (GAUZE/BANDAGES/DRESSINGS) IMPLANT
DRAPE C-ARM 42X72 X-RAY (DRAPES) ×2 IMPLANT
DRAPE HALF SHEET 40X57 (DRAPES) ×2 IMPLANT
DRAPE INCISE IOBAN 66X45 STRL (DRAPES) ×2 IMPLANT
DRAPE LAPAROTOMY 100X72X124 (DRAPES) ×2 IMPLANT
DRAPE WARM FLUID 44X44 (DRAPES) ×2 IMPLANT
DURAPREP 26ML APPLICATOR (WOUND CARE) ×2 IMPLANT
GAUZE 4X4 16PLY ~~LOC~~+RFID DBL (SPONGE) ×2 IMPLANT
GLOVE EXAM NITRILE XL STR (GLOVE) IMPLANT
GLOVE SURG LTX SZ8.5 (GLOVE) ×2 IMPLANT
GLOVE SURG UNDER POLY LF SZ8.5 (GLOVE) ×2 IMPLANT
GOWN STRL REUS W/ TWL LRG LVL3 (GOWN DISPOSABLE) IMPLANT
GOWN STRL REUS W/ TWL XL LVL3 (GOWN DISPOSABLE) ×1 IMPLANT
GOWN STRL REUS W/TWL 2XL LVL3 (GOWN DISPOSABLE) ×2 IMPLANT
GOWN STRL REUS W/TWL LRG LVL3 (GOWN DISPOSABLE)
GOWN STRL REUS W/TWL XL LVL3 (GOWN DISPOSABLE) ×2
KIT BASIN OR (CUSTOM PROCEDURE TRAY) ×2 IMPLANT
KIT TURNOVER KIT B (KITS) ×2 IMPLANT
NDL HYPO 25X1 1.5 SAFETY (NEEDLE) ×1 IMPLANT
NEEDLE HYPO 25X1 1.5 SAFETY (NEEDLE) ×2 IMPLANT
NS IRRIG 1000ML POUR BTL (IV SOLUTION) ×2 IMPLANT
PACK SURGICAL SETUP 50X90 (CUSTOM PROCEDURE TRAY) ×2 IMPLANT
PAD ARMBOARD 7.5X6 YLW CONV (MISCELLANEOUS) ×6 IMPLANT
SUT VICRYL RAPIDE 4/0 PS 2 (SUTURE) ×2 IMPLANT
SYR CONTROL 10ML LL (SYRINGE) ×4 IMPLANT
TOWEL GREEN STERILE (TOWEL DISPOSABLE) ×2 IMPLANT
TOWEL GREEN STERILE FF (TOWEL DISPOSABLE) ×2 IMPLANT
TRAY KYPHOPAK 15/3 ONESTEP 1ST (MISCELLANEOUS) ×1 IMPLANT

## 2020-10-10 NOTE — Anesthesia Preprocedure Evaluation (Addendum)
Anesthesia Evaluation  Patient identified by MRN, date of birth, ID band Patient awake    Reviewed: Allergy & Precautions, NPO status , Patient's Chart, lab work & pertinent test results  Airway Mallampati: III  TM Distance: <3 FB Neck ROM: Full    Dental  (+) Teeth Intact, Dental Advisory Given   Pulmonary sleep apnea , former smoker,    Pulmonary exam normal        Cardiovascular hypertension,  Rhythm:Regular Rate:Normal     Neuro/Psych PSYCHIATRIC DISORDERS Anxiety  Neuromuscular disease    GI/Hepatic Neg liver ROS, GERD  ,  Endo/Other  diabetes  Renal/GU negative Renal ROS     Musculoskeletal  (+) Arthritis ,   Abdominal Normal abdominal exam  (+)   Peds  Hematology negative hematology ROS (+)   Anesthesia Other Findings   Reproductive/Obstetrics                            Anesthesia Physical Anesthesia Plan  ASA: 2  Anesthesia Plan: General   Post-op Pain Management:    Induction: Intravenous  PONV Risk Score and Plan: 3 and Ondansetron, Dexamethasone and Midazolam  Airway Management Planned: Oral ETT  Additional Equipment: None  Intra-op Plan:   Post-operative Plan: Extubation in OR  Informed Consent: I have reviewed the patients History and Physical, chart, labs and discussed the procedure including the risks, benefits and alternatives for the proposed anesthesia with the patient or authorized representative who has indicated his/her understanding and acceptance.     Dental advisory given  Plan Discussed with: CRNA  Anesthesia Plan Comments:        Anesthesia Quick Evaluation

## 2020-10-10 NOTE — H&P (Signed)
Scott [name] is an 65 y.o. male.   Chief Complaint: Back pain secondary to L1 fracture HPI: Scott [name] is a 65 year old individual who sustained a fall from about 4 feet while getting out of his truck.  He experienced the immediate onset of severe pain in the mid and low back.  This has persisted and it was noted that he had a proximately 30% compression fracture of the L1 vertebrae.  He was advised regarding acrylic balloon kyphoplasty and is now taken to the operating room for this procedure.  The fracture is about 21 weeks old.  Past Medical History:  Diagnosis Date   Anxiety    Arthritis    Asbestosis (HCC)    Diabetes mellitus without complication (HCC)    Dyslipidemia    GERD (gastroesophageal reflux disease)    Numbness    right side of tongue since 2016 surgery   Sleep apnea    uses cpap   Tobacco abuse     Past Surgical History:  Procedure Laterality Date   CHOLECYSTECTOMY N/A 01/10/2016   Procedure: LAPAROSCOPIC CHOLECYSTECTOMY;  Surgeon: Darnell Level, MD;  Location: [facility];  Service: General;  Laterality: N/A;   EYE SURGERY Bilateral    cataract surgery with lens implants   FOOT MASS EXCISION Right 35 yrs ago   right knuckle replaced  11/30/2014   saliva gland removed  04/2014   dr Clovis Riley gore   VASECTOMY      Family History  Problem Relation Age of Onset   Stroke Father        Died age 53   CAD Father    CAD Brother 51       CABG   Social History:  reports that he quit smoking about 5 years ago. His smoking use included cigarettes. He has never used smokeless tobacco. He reports previous alcohol use. He reports that he does not use drugs.  Allergies:  Allergies  Allergen Reactions   Augmentin [Amoxicillin-Pot Clavulanate] Itching and Nausea And Vomiting   Codeine Itching and Nausea And Vomiting   Penicillins Hives and Itching    Has patient had a PCN reaction causing immediate rash, facial/tongue/throat swelling, SOB or lightheadedness with  hypotension: No Has patient had a PCN reaction causing severe rash involving mucus membranes or skin necrosis: No Has patient had a PCN reaction that required hospitalization No Has patient had a PCN reaction occurring within the last 10 years: No If all of the above answers are "NO", then may proceed with Cephalosporin use.    Atorvastatin     Myalgias and fatigue   Oxycontin [Oxycodone Hcl] Hives and Itching    Medications Prior to Admission  Medication Sig Dispense Refill   Ascorbic Acid (VITAMIN C) 1000 MG tablet Take 1,000 mg by mouth daily.     aspirin 81 MG tablet Take 1 tablet (81 mg total) by mouth daily. 30 tablet    Cholecalciferol (VITAMIN D) 50 MCG (2000 UT) CAPS Take 2,000 Units by mouth daily.     cycloSPORINE (RESTASIS) 0.05 % ophthalmic emulsion Place 1 drop into both eyes 2 (two) times daily.      dapagliflozin propanediol (FARXIGA) 10 MG TABS tablet Take 1 tablet (10 mg total) by mouth daily before breakfast. 90 tablet 3   HYDROcodone-acetaminophen (NORCO) 5-325 MG tablet Take 1 tablet by mouth every 4 (four) hours as needed for moderate pain. 90 tablet 0   lisinopril (ZESTRIL) 5 MG tablet TAKE ONE (1) TABLET EACH DAY (Patient taking differently:  Take 5 mg by mouth daily.) 90 tablet 3   loratadine (CLARITIN) 10 MG tablet Take 10 mg by mouth daily as needed for allergies.     metFORMIN (GLUCOPHAGE) 500 MG tablet Take 2 tablets (1,000 mg total) by mouth 2 (two) times daily. 360 tablet 1   naproxen sodium (ALEVE) 220 MG tablet Take 440 mg by mouth 2 (two) times daily as needed (pain).     Omega-3 1000 MG CAPS Take 1,000 mg by mouth daily.     rosuvastatin (CRESTOR) 10 MG tablet TAKE ONE (1) TABLET EACH DAY (Patient taking differently: Take 10 mg by mouth daily.) 90 tablet 3   Semaglutide (RYBELSUS) 3 MG TABS Take 6 mg by mouth daily. (Patient taking differently: Take 3 mg by mouth daily.) 30 tablet 0   vitamin E 180 MG (400 UNITS) capsule Take 800 Units by mouth daily.      albuterol (PROVENTIL HFA;VENTOLIN HFA) 108 (90 Base) MCG/ACT inhaler Inhale 2 puffs into the lungs every 6 (six) hours as needed for wheezing. 1 Inhaler 3   baclofen (LIORESAL) 10 MG tablet Take 1 tablet (10 mg total) by mouth 3 (three) times daily. (Patient not taking: No sig reported) 30 each 0   budesonide-formoterol (SYMBICORT) 80-4.5 MCG/ACT inhaler Inhale 2 puffs into the lungs 2 (two) times daily. Bringing in coupon: BIN [id number], Grp [id number]; ID [id number] (Patient not taking: No sig reported) 1 Inhaler 3   diclofenac (VOLTAREN) 75 MG EC tablet Take 1 tablet (75 mg total) by mouth 2 (two) times daily. (Patient not taking: No sig reported) 30 tablet 0   diclofenac sodium (VOLTAREN) 1 % GEL Apply 4 g topically 4 (four) times daily. (Patient not taking: No sig reported) 350 g 1   fluticasone (FLONASE) 50 MCG/ACT nasal spray Place 2 sprays into both nostrils daily. (Patient not taking: No sig reported) 16 g 11   omega-3 acid ethyl esters (LOVAZA) 1 g capsule Take 2 capsules (2 g total) by mouth 2 (two) times daily. (Patient not taking: No sig reported) 120 capsule 2   Vitamin D, Ergocalciferol, (DRISDOL) 1.25 MG (50000 UT) CAPS capsule Take 1 capsule (50,000 Units total) by mouth every 7 (seven) days. 12 capsule 3    Results for orders placed or performed during the hospital encounter of 10/10/20 (from the past 48 hour(s))  Glucose, capillary     Status: Abnormal   Collection Time: 10/10/20  5:34 AM  Result Value Ref Range   Glucose-Capillary 135 (H) 70 - 99 mg/dL    Comment: Glucose reference range applies only to samples taken after fasting for at least 8 hours.   No results found.  Review of Systems  Constitutional:  Positive for activity change.  HENT: Negative.    Eyes: Negative.   Respiratory: Negative.    Cardiovascular: Negative.   Gastrointestinal: Negative.   Endocrine: Negative.   Genitourinary: Negative.   Musculoskeletal:  Positive for back pain and gait problem.   Allergic/Immunologic: Negative.   Hematological: Negative.   Psychiatric/Behavioral: Negative.     Blood pressure 131/66, pulse 71, temperature 98 F (36.7 C), temperature source Oral, resp. rate 17, SpO2 100 %. Physical Exam Constitutional:      Appearance: Normal appearance. He is obese.  HENT:     Head: Normocephalic and atraumatic.     Right Ear: Tympanic membrane normal.     Left Ear: Tympanic membrane normal.     Nose: Nose normal.     Mouth/Throat:  Mouth: Mucous membranes are moist.  Eyes:     Extraocular Movements: Extraocular movements intact.     Pupils: Pupils are equal, round, and reactive to light.  Cardiovascular:     Rate and Rhythm: Normal rate and regular rhythm.     Pulses: Normal pulses.     Heart sounds: Normal heart sounds.  Pulmonary:     Effort: Pulmonary effort is normal.     Breath sounds: Normal breath sounds.  Abdominal:     General: Abdomen is flat.     Palpations: Abdomen is soft.  Musculoskeletal:     Cervical back: Normal range of motion and neck supple.     Comments: Tenderness to palpation and percussion across the thoracolumbar junction.  Skin:    General: Skin is warm and dry.     Capillary Refill: Capillary refill takes less than 2 seconds.  Neurological:     General: No focal deficit present.     Mental Status: He is alert.  Psychiatric:        Mood and Affect: Mood normal.        Behavior: Behavior normal.        Thought Content: Thought content normal.        Judgment: Judgment normal.     Assessment/Plan L1 compression fracture secondary to trauma from a 4 foot fall.  Plan: Acrylic balloon kyphoplasty L1  Stefani Dama, MD 10/10/2020, 7:42 AM

## 2020-10-10 NOTE — Op Note (Signed)
Date of surgery: 10/10/2020 Preoperative diagnosis: L2 compression fracture. Postoperative diagnosis: L2 compression fracture Procedure: L2 acrylic balloon kyphoplasty Surgeon: Barnett Abu Anesthesia: General endotracheal Indications: Scott [name] is a 65 year old individual who 2 weeks ago had a fall from a truck of approximately 4 feet he landed hard and noted Scott onset of immediate back pain.  Is found to have a compression fracture.  An x-ray demonstrated that this was Scott L2 vertebrae.  An MRI demonstrated Scott same thing however Scott official report was read as an L1 compression fracture.  I was informed of Scott [name]'s pain and had seen Scott MRI report.  Scott [name] was posted for an L1 acrylic balloon kyphoplasty.  In Scott operating room under fluoroscopic visualization it was clear that Scott L2 vertebrae was Scott fractured vertebrae Scott L2 vertebrae was identified correctly in Scott plain radiographs and corresponds to Scott vertebrae seen on Scott MRI however Scott official MRI report noted that this was L1.  Scott procedure was completed at Scott L2 vertebrae which was Scott fractured vertebrae.  I contacted Scott radiology department afterwards to inform them of Scott interpretation error.  Procedure: Scott [name] was brought to Scott operating room supine on Scott stretcher.  After Scott smooth induction of general endotracheal anesthesia he was carefully turned prone.  Fluoroscopic imaging was brought into Scott field in Scott biplane fashion and Scott fractured vertebrae of L2 was identified.  Scott discrepancy was noted between Scott MRI report and Scott operative consent however this being Scott only fractured vertebrae in Scott entire spine it was indeed Scott target vertebrae.  Scott skin was infiltrated in an area 5 cm lateral to Scott left-sided pedicle of L2.  A Jamshidi needle was then inserted via Scott transpedicular route into Scott vertebral body of L2.  A balloon was inflated to 3 cc and this was noted to extend across Scott  midline to Scott opposite side.  Then kyphoplasty cement was mixed to Scott appropriate consistency and 4-1/2 cc of cement was injected into Scott vertebral body getting a good fill across Scott top of Scott fractured fragment of Scott L2 vertebral body.  Some elevation of Scott superior endplate was noted.  At this point Scott procedure was stopped and no further cement was administered.  Scott Jamshidi needle had Scott trocar replaced and was then removed and final radiographs were obtained in AP and lateral projection.  Blood loss for this procedure was nil.  A singular 4-0 Vicryl suture was placed in Scott skin [name] tolerated procedure well is returned to recovery room in stable condition.

## 2020-10-10 NOTE — Discharge Summary (Signed)
Physician Discharge Summary  Patient ID: Scott [name] MRN: [id number] DOB/AGE: 17-Jul-1955 65 y.o.  Admit date: 10/10/2020 Discharge date: 10/10/2020  Admission Diagnoses: L2 compression fracture.  Back pain  Discharge Diagnoses: L2 compression fracture.  Back pain. Active Problems:   * No active hospital problems. *   Discharged Condition: good  Hospital Course: Patient underwent acrylic balloon kyphoplasty which he tolerated well.  Consults: None  Significant Diagnostic Studies: None  Treatments: surgery: See op note  Discharge Exam: Blood pressure 119/68, pulse 71, temperature (!) 97 F (36.1 C), resp. rate 15, SpO2 91 %. Patient notes a marked decrease in back pain ambulation and neurologic exam is intact.  Disposition: Discharge disposition: 01-Home or Self Care       Discharge Instructions     Call MD for:  redness, tenderness, or signs of infection (pain, swelling, redness, odor or green/yellow discharge around incision site)   Complete by: As directed    Call MD for:  severe uncontrolled pain   Complete by: As directed    Call MD for:  temperature >100.4   Complete by: As directed    Diet - low sodium heart healthy   Complete by: As directed    Incentive spirometry RT   Complete by: As directed    Increase activity slowly   Complete by: As directed    No wound care   Complete by: As directed       Allergies as of 10/10/2020       Reactions   Augmentin [amoxicillin-pot Clavulanate] Itching, Nausea And Vomiting   Codeine Itching, Nausea And Vomiting   Penicillins Hives, Itching   Has patient had a PCN reaction causing immediate rash, facial/tongue/throat swelling, SOB or lightheadedness with hypotension: No Has patient had a PCN reaction causing severe rash involving mucus membranes or skin necrosis: No Has patient had a PCN reaction that required hospitalization No Has patient had a PCN reaction occurring within the last 10 years: No If all of the  above answers are "NO", then may proceed with Cephalosporin use.   Atorvastatin    Myalgias and fatigue   Oxycontin [oxycodone Hcl] Hives, Itching        Medication List     TAKE these medications    albuterol 108 (90 Base) MCG/ACT inhaler Commonly known as: VENTOLIN HFA Inhale 2 puffs into the lungs every 6 (six) hours as needed for wheezing.   aspirin 81 MG tablet Take 1 tablet (81 mg total) by mouth daily.   cycloSPORINE 0.05 % ophthalmic emulsion Commonly known as: RESTASIS Place 1 drop into both eyes 2 (two) times daily.   dapagliflozin propanediol 10 MG Tabs tablet Commonly known as: Farxiga Take 1 tablet (10 mg total) by mouth daily before breakfast.   HYDROcodone-acetaminophen 5-325 MG tablet Commonly known as: Norco Take 1 tablet by mouth every 4 (four) hours as needed for moderate pain.   lisinopril 5 MG tablet Commonly known as: ZESTRIL TAKE ONE (1) TABLET EACH DAY What changed:  how much to take how to take this when to take this additional instructions   loratadine 10 MG tablet Commonly known as: CLARITIN Take 10 mg by mouth daily as needed for allergies.   metFORMIN 500 MG tablet Commonly known as: GLUCOPHAGE Take 2 tablets (1,000 mg total) by mouth 2 (two) times daily.   naproxen sodium 220 MG tablet Commonly known as: ALEVE Take 440 mg by mouth 2 (two) times daily as needed (pain).   Omega-3 1000 MG Caps  Take 1,000 mg by mouth daily.   rosuvastatin 10 MG tablet Commonly known as: CRESTOR TAKE ONE (1) TABLET EACH DAY What changed:  how much to take how to take this when to take this additional instructions   Rybelsus 3 MG Tabs Generic drug: Semaglutide Take 6 mg by mouth daily. What changed: how much to take   vitamin C 1000 MG tablet Take 1,000 mg by mouth daily.   Vitamin D (Ergocalciferol) 1.25 MG (50000 UNIT) Caps capsule Commonly known as: DRISDOL Take 1 capsule (50,000 Units total) by mouth every 7 (seven) days.    Vitamin D 50 MCG (2000 UT) Caps Take 2,000 Units by mouth daily.   vitamin E 180 MG (400 UNITS) capsule Take 800 Units by mouth daily.       ASK your doctor about these medications    baclofen 10 MG tablet Commonly known as: LIORESAL Take 1 tablet (10 mg total) by mouth 3 (three) times daily.   budesonide-formoterol 80-4.5 MCG/ACT inhaler Commonly known as: SYMBICORT Inhale 2 puffs into the lungs 2 (two) times daily. Bringing in coupon: BIN [id number], Grp [id number]; ID [id number]   diclofenac 75 MG EC tablet Commonly known as: VOLTAREN Take 1 tablet (75 mg total) by mouth 2 (two) times daily.   diclofenac sodium 1 % Gel Commonly known as: VOLTAREN Apply 4 g topically 4 (four) times daily.   fluticasone 50 MCG/ACT nasal spray Commonly known as: FLONASE Place 2 sprays into both nostrils daily.   omega-3 acid ethyl esters 1 g capsule Commonly known as: LOVAZA Take 2 capsules (2 g total) by mouth 2 (two) times daily.         Signed: Stefani Dama 10/10/2020, 9:53 AM

## 2020-10-10 NOTE — Anesthesia Procedure Notes (Signed)
Procedure Name: Intubation Date/Time: 10/10/2020 8:00 AM Performed by: Rosiland Oz, CRNA Pre-anesthesia Checklist: Patient identified, Emergency Drugs available, Suction available, Patient being monitored and Timeout performed Patient Re-evaluated:Patient Re-evaluated prior to induction Oxygen Delivery Method: Circle system utilized Preoxygenation: Pre-oxygenation with 100% oxygen Induction Type: IV induction Ventilation: Mask ventilation without difficulty Laryngoscope Size: Miller and 3 Grade View: Grade I Tube type: Oral Tube size: 7.5 mm Number of attempts: 1 Airway Equipment and Method: Stylet Placement Confirmation: ETT inserted through vocal cords under direct vision, positive ETCO2 and breath sounds checked- equal and bilateral Secured at: 22 cm Tube secured with: Tape Dental Injury: Teeth and Oropharynx as per pre-operative assessment

## 2020-10-10 NOTE — Anesthesia Postprocedure Evaluation (Signed)
Anesthesia Post Note  Patient: Scott [name]  Procedure(s) Performed: Lumbar Two Kyphoplasty (Spine Lumbar)     Patient location during evaluation: PACU Anesthesia Type: General Level of consciousness: awake and alert Pain management: pain level controlled Vital Signs Assessment: post-procedure vital signs reviewed and stable Respiratory status: spontaneous breathing, nonlabored ventilation, respiratory function stable and patient connected to nasal cannula oxygen Cardiovascular status: blood pressure returned to baseline and stable Postop Assessment: no apparent nausea or vomiting Anesthetic complications: no   No notable events documented.  Last Vitals:  Vitals:   10/10/20 0940 10/10/20 0955  BP: 119/68 121/63  Pulse: 71 74  Resp: 15 10  Temp:  (!) 36.1 C  SpO2: 91% 94%    Last Pain:  Vitals:   10/10/20 0925  TempSrc:   PainSc: 3                  Shelton Silvas

## 2020-10-10 NOTE — Transfer of Care (Signed)
Immediate Anesthesia Transfer of Care Note  Patient: Scott [name]  Procedure(s) Performed: Lumbar Two Kyphoplasty (Spine Lumbar)  Patient Location: PACU  Anesthesia Type:General  Level of Consciousness: drowsy and patient cooperative  Airway & Oxygen Therapy: Patient Spontanous Breathing  Post-op Assessment: Report given to RN and Post -op Vital signs reviewed and stable  Post vital signs: Reviewed and stable  Last Vitals:  Vitals Value Taken Time  BP 147/69 10/10/20 0854  Temp    Pulse 76 10/10/20 0855  Resp 9 10/10/20 0855  SpO2 97 % 10/10/20 0855  Vitals shown include unvalidated device data.  Last Pain:  Vitals:   10/10/20 0600  TempSrc:   PainSc: 4          Complications: No notable events documented.

## 2020-10-12 ENCOUNTER — Encounter (HOSPITAL_COMMUNITY): Payer: Self-pay | Admitting: Neurological Surgery

## 2020-10-14 ENCOUNTER — Other Ambulatory Visit: Payer: Self-pay | Admitting: Family Medicine

## 2020-10-14 DIAGNOSIS — E1159 Type 2 diabetes mellitus with other circulatory complications: Secondary | ICD-10-CM

## 2020-10-14 DIAGNOSIS — I152 Hypertension secondary to endocrine disorders: Secondary | ICD-10-CM

## 2020-10-26 DIAGNOSIS — S32010B Wedge compression fracture of first lumbar vertebra, initial encounter for open fracture: Secondary | ICD-10-CM | POA: Diagnosis not present

## 2020-11-07 ENCOUNTER — Other Ambulatory Visit: Payer: Self-pay | Admitting: Family Medicine

## 2020-11-07 ENCOUNTER — Telehealth: Payer: Self-pay | Admitting: *Deleted

## 2020-11-07 DIAGNOSIS — E1169 Type 2 diabetes mellitus with other specified complication: Secondary | ICD-10-CM

## 2020-11-07 NOTE — Telephone Encounter (Signed)
Pt called - pt assistance meds here- up front with name on.  #4 Rybelsus boxes came - 7mg 

## 2020-11-23 ENCOUNTER — Ambulatory Visit (INDEPENDENT_AMBULATORY_CARE_PROVIDER_SITE_OTHER): Payer: Medicare Other | Admitting: Family Medicine

## 2020-11-23 ENCOUNTER — Encounter: Payer: Self-pay | Admitting: Family Medicine

## 2020-11-23 ENCOUNTER — Telehealth: Payer: Self-pay | Admitting: Family Medicine

## 2020-11-23 ENCOUNTER — Other Ambulatory Visit: Payer: Self-pay

## 2020-11-23 VITALS — BP 124/80 | HR 87 | Temp 97.4°F | Ht 71.0 in | Wt 212.6 lb

## 2020-11-23 DIAGNOSIS — N649 Disorder of breast, unspecified: Secondary | ICD-10-CM

## 2020-11-23 DIAGNOSIS — E1165 Type 2 diabetes mellitus with hyperglycemia: Secondary | ICD-10-CM | POA: Diagnosis not present

## 2020-11-23 DIAGNOSIS — E785 Hyperlipidemia, unspecified: Secondary | ICD-10-CM | POA: Diagnosis not present

## 2020-11-23 DIAGNOSIS — I152 Hypertension secondary to endocrine disorders: Secondary | ICD-10-CM

## 2020-11-23 DIAGNOSIS — E1159 Type 2 diabetes mellitus with other circulatory complications: Secondary | ICD-10-CM | POA: Diagnosis not present

## 2020-11-23 DIAGNOSIS — E1169 Type 2 diabetes mellitus with other specified complication: Secondary | ICD-10-CM | POA: Diagnosis not present

## 2020-11-23 LAB — BAYER DCA HB A1C WAIVED: HB A1C (BAYER DCA - WAIVED): 6.7 % — ABNORMAL HIGH (ref 4.8–5.6)

## 2020-11-23 MED ORDER — RYBELSUS 7 MG PO TABS: 7.0000 mg | ORAL_TABLET | Freq: Every day | ORAL | 0 refills | Status: DC

## 2020-11-23 NOTE — Progress Notes (Signed)
Subjective: CC: DM PCP: [name], DO ATF:[id number] Scott [name] is a 65 y.o. male presenting to clinic today for:  1. Type 2 Diabetes with hypertension, hyperlipidemia:  At last visit his Rybelsus was increased to 7 mg.  He was continued on Jardiance and metformin.  Last eye exam: UTD Last foot exam: UTD Last A1c:  Lab Results  Component Value Date   HGBA1C 7.7 (H) 08/18/2020   Nephropathy screen indicated?: UTD Last flu, zoster and/or pneumovax:  Immunization History  Administered Date(s) Administered   Influenza,inj,Quad PF,6+ Mos 12/29/2013, 12/30/2014, 02/14/2016, 01/16/2017, 01/24/2018, 03/09/2019   PFIZER(Purple Top)SARS-COV-2 Vaccination 10/23/2019   Pneumococcal Conjugate-13 08/22/2020   Pneumococcal Polysaccharide-23 03/12/2009   Tdap 12/30/2014   Zoster, Live 02/22/2012    ROS: Denies LOC, polyuria, polydipsia, unintended weight loss/gain, foot ulcerations, numbness or tingling in extremities, shortness of breath or chest pain.He did sustain a mechanical fall and broke his back.  He is s/p surgery for this and seems to be doing well now.  2.  Skin lesion Patient reports a skin lesion along the right side of his nipple.  This is been present since before our last visit but he forgot to mention it.  He denies any spontaneous bleeding.  Does not have a dermatologist but would like to see 1.   ROS: Per HPI  Allergies  Allergen Reactions   Augmentin [Amoxicillin-Pot Clavulanate] Itching and Nausea And Vomiting   Codeine Itching and Nausea And Vomiting   Penicillins Hives and Itching    Has patient had a PCN reaction causing immediate rash, facial/tongue/throat swelling, SOB or lightheadedness with hypotension: No Has patient had a PCN reaction causing severe rash involving mucus membranes or skin necrosis: No Has patient had a PCN reaction that required hospitalization No Has patient had a PCN reaction occurring within the last 10 years: No If all of the  above answers are "NO", then may proceed with Cephalosporin use.    Atorvastatin     Myalgias and fatigue   Oxycontin [Oxycodone Hcl] Hives and Itching   Past Medical History:  Diagnosis Date   Anxiety    Arthritis    Asbestosis (HCC)    Diabetes mellitus without complication (HCC)    Dyslipidemia    GERD (gastroesophageal reflux disease)    Numbness    right side of tongue since 2016 surgery   Sleep apnea    uses cpap   Tobacco abuse     Current Outpatient Medications:    albuterol (PROVENTIL HFA;VENTOLIN HFA) 108 (90 Base) MCG/ACT inhaler, Inhale 2 puffs into the lungs every 6 (six) hours as needed for wheezing., Disp: 1 Inhaler, Rfl: 3   Ascorbic Acid (VITAMIN C) 1000 MG tablet, Take 1,000 mg by mouth daily., Disp: , Rfl:    aspirin 81 MG tablet, Take 1 tablet (81 mg total) by mouth daily., Disp: 30 tablet, Rfl:    baclofen (LIORESAL) 10 MG tablet, Take 1 tablet (10 mg total) by mouth 3 (three) times daily. (Patient not taking: No sig reported), Disp: 30 each, Rfl: 0   budesonide-formoterol (SYMBICORT) 80-4.5 MCG/ACT inhaler, Inhale 2 puffs into the lungs 2 (two) times daily. Bringing in coupon: BIN [id number], Grp [id number]; ID [id number] (Patient not taking: No sig reported), Disp: 1 Inhaler, Rfl: 3   Cholecalciferol (VITAMIN D) 50 MCG (2000 UT) CAPS, Take 2,000 Units by mouth daily., Disp: , Rfl:    cycloSPORINE (RESTASIS) 0.05 % ophthalmic emulsion, Place 1 drop into both eyes 2 (two) times daily. ,  Disp: , Rfl:    dapagliflozin propanediol (FARXIGA) 10 MG TABS tablet, Take 1 tablet (10 mg total) by mouth daily before breakfast., Disp: 90 tablet, Rfl: 3   diclofenac (VOLTAREN) 75 MG EC tablet, Take 1 tablet (75 mg total) by mouth 2 (two) times daily. (Patient not taking: No sig reported), Disp: 30 tablet, Rfl: 0   diclofenac sodium (VOLTAREN) 1 % GEL, Apply 4 g topically 4 (four) times daily. (Patient not taking: No sig reported), Disp: 350 g, Rfl: 1   fluticasone (FLONASE) 50  MCG/ACT nasal spray, Place 2 sprays into both nostrils daily. (Patient not taking: No sig reported), Disp: 16 g, Rfl: 11   HYDROcodone-acetaminophen (NORCO) 5-325 MG tablet, Take 1 tablet by mouth every 4 (four) hours as needed for moderate pain., Disp: 90 tablet, Rfl: 0   lisinopril (ZESTRIL) 5 MG tablet, TAKE ONE (1) TABLET EACH DAY, Disp: 90 tablet, Rfl: 0   loratadine (CLARITIN) 10 MG tablet, Take 10 mg by mouth daily as needed for allergies., Disp: , Rfl:    metFORMIN (GLUCOPHAGE) 500 MG tablet, TAKE TWO TABLETS BY MOUTH TWICE DAILY, Disp: 360 tablet, Rfl: 0   naproxen sodium (ALEVE) 220 MG tablet, Take 440 mg by mouth 2 (two) times daily as needed (pain)., Disp: , Rfl:    Omega-3 1000 MG CAPS, Take 1,000 mg by mouth daily., Disp: , Rfl:    omega-3 acid ethyl esters (LOVAZA) 1 g capsule, Take 2 capsules (2 g total) by mouth 2 (two) times daily. (Patient not taking: No sig reported), Disp: 120 capsule, Rfl: 2   rosuvastatin (CRESTOR) 10 MG tablet, TAKE ONE (1) TABLET EACH DAY, Disp: 90 tablet, Rfl: 0   Semaglutide (RYBELSUS) 3 MG TABS, Take 6 mg by mouth daily., Disp: 30 tablet, Rfl: 0   Vitamin D, Ergocalciferol, (DRISDOL) 1.25 MG (50000 UT) CAPS capsule, Take 1 capsule (50,000 Units total) by mouth every 7 (seven) days., Disp: 12 capsule, Rfl: 3   vitamin E 180 MG (400 UNITS) capsule, Take 800 Units by mouth daily., Disp: , Rfl:  Social History   Socioeconomic History   Marital status: Married    Spouse name: Not on file   Number of children: 2   Years of education: Not on file   Highest education level: Not on file  Occupational History   Occupation: Plumber    Employer: SELF EMPLOYED  Tobacco Use   Smoking status: Former    Packs/day: 0.00    Types: Cigarettes    Quit date: 01/17/2015    Years since quitting: 5.8   Smokeless tobacco: Never   Tobacco comments:    He has patches.  Vaping Use   Vaping Use: Never used  Substance and Sexual Activity   Alcohol use: Not Currently     Comment: social   Drug use: No   Sexual activity: Not on file  Other Topics Concern   Not on file  Social History Narrative   Lives at home with wife.    Social Determinants of Health   Financial Resource Strain: Not on file  Food Insecurity: Not on file  Transportation Needs: Not on file  Physical Activity: Not on file  Stress: Not on file  Social Connections: Not on file  Intimate Partner Violence: Not on file   Family History  Problem Relation Age of Onset   Stroke Father        Died age 30   CAD Father    CAD Brother 64  CABG    Objective: Office vital signs reviewed. BP 124/80   Pulse 87   Temp (!) 97.4 F (36.3 C)   Ht 5\' 11"  (1.803 m)   Wt 212 lb 9.6 oz (96.4 kg)   SpO2 97%   BMI 29.65 kg/m   Physical Examination:  General: Awake, alert, well nourished, No acute distress HEENT: Normal, sclera white, MMM Cardio: regular rate and rhythm, S1S2 heard, no murmurs appreciated Pulm: clear to auscultation bilaterally, no wheezes, rhonchi or rales; normal work of breathing on room air Extremities: warm, well perfused, No edema, cyanosis or clubbing; +2 pulses bilaterally MSK: normal gait and station Skin: Papular lesion with central umbilication and rolled borders noted along the right nipple at approximately the 9 o'clock position.  Lab Results  Component Value Date   HGBA1C 6.7 (H) 11/23/2020    Assessment/ Plan: 65 y.o. male   Controlled type 2 diabetes mellitus with other specified complication, without long-term current use of insulin (HCC) - Plan: Bayer DCA Hb A1c Waived  Hypertension associated with diabetes (HCC)  Hyperlipidemia associated with type 2 diabetes mellitus (HCC)  Lesion of right nipple - Plan: Ambulatory referral to Dermatology  Sugar now under excellent control with A1c down to 6.7.  Continue Rybelsus 7 mg daily, metformin and Farxiga  as prescribed.  Blood pressure controlled.  No changes  Not yet due for fasting  lipid  Uncertain as to whether or not this lesion represents a basal cell carcinoma but I placed a referral to dermatology for further evaluation  May follow-up in 4 to 6 months, sooner if needed  No orders of the defined types were placed in this encounter.  No orders of the defined types were placed in this encounter.    76, DO [facility] [phone]

## 2020-11-30 ENCOUNTER — Institutional Professional Consult (permissible substitution): Payer: Medicare Other | Admitting: Pulmonary Disease

## 2020-12-05 ENCOUNTER — Encounter: Payer: Self-pay | Admitting: Pulmonary Disease

## 2020-12-05 ENCOUNTER — Other Ambulatory Visit: Payer: Self-pay

## 2020-12-05 ENCOUNTER — Ambulatory Visit (INDEPENDENT_AMBULATORY_CARE_PROVIDER_SITE_OTHER): Payer: Medicare Other | Admitting: Pulmonary Disease

## 2020-12-05 VITALS — BP 120/58 | HR 91 | Temp 97.8°F | Ht 71.0 in | Wt 217.6 lb

## 2020-12-05 DIAGNOSIS — G4733 Obstructive sleep apnea (adult) (pediatric): Secondary | ICD-10-CM | POA: Diagnosis not present

## 2020-12-05 NOTE — Patient Instructions (Addendum)
Contact [facility] regarding new CPAP  Cannot sleep without her CPAP, current machine is dysfunctional  Auto titrating CPAP 8-15  Continue using CPAP on a nightly basis  If the medical supply company says you need a new study before they can get you a new CPAP, your only other choice would be if you want to purchase one-I will be willing to give you a prescription for your current settings

## 2020-12-05 NOTE — Progress Notes (Signed)
Scott [name]    [id number]    [birth year]  Primary Care Physician:Gottschalk, Rozell Searing, DO  Referring Physician: [name], DO [address] [place],  [place] [address]  Chief complaint:   Patient with obstructive sleep apnea Machine is dysfunctional  HPI:  Was last seen in 2018 for a new machine Has been using his CPAP on a nightly basis  Patient is becoming dysfunctional  Diagnosed over 30 years ago Study not available in the system  Uses CPAP every night Cannot sleep without his CPAP Uses a CPAP with naps  Sleep is very restorative with CPAP Goes to bed about 10, wakes up 7:30-8, 1-2 awakenings to use the restroom  Sleep study was diagnosed based off of hypersomnia, snoring, gasping respirations, choking-all the symptoms improved with CPAP Has used about 4 machines in total  Retired Nutritional therapist, worked at [employer]  Outpatient Encounter Medications as of 12/05/2020  Medication Sig   albuterol (PROVENTIL HFA;VENTOLIN HFA) 108 (90 Base) MCG/ACT inhaler Inhale 2 puffs into the lungs every 6 (six) hours as needed for wheezing.   Ascorbic Acid (VITAMIN C) 1000 MG tablet Take 1,000 mg by mouth daily.   aspirin 81 MG tablet Take 1 tablet (81 mg total) by mouth daily.   baclofen (LIORESAL) 10 MG tablet Take 1 tablet (10 mg total) by mouth 3 (three) times daily.   Cholecalciferol (VITAMIN D) 50 MCG (2000 UT) CAPS Take 2,000 Units by mouth daily.   cycloSPORINE (RESTASIS) 0.05 % ophthalmic emulsion Place 1 drop into both eyes 2 (two) times daily.    dapagliflozin propanediol (FARXIGA) 10 MG TABS tablet Take 1 tablet (10 mg total) by mouth daily before breakfast.   lisinopril (ZESTRIL) 5 MG tablet TAKE ONE (1) TABLET EACH DAY   loratadine (CLARITIN) 10 MG tablet Take 10 mg by mouth daily as needed for allergies.   metFORMIN (GLUCOPHAGE) 500 MG tablet TAKE TWO TABLETS BY MOUTH TWICE DAILY   naproxen sodium (ALEVE) 220 MG tablet Take 440 mg by mouth 2 (two) times  daily as needed (pain).   Omega-3 1000 MG CAPS Take 1,000 mg by mouth daily.   rosuvastatin (CRESTOR) 10 MG tablet TAKE ONE (1) TABLET EACH DAY   Semaglutide (RYBELSUS) 7 MG TABS Take 7 mg by mouth daily.   Vitamin D, Ergocalciferol, (DRISDOL) 1.25 MG (50000 UT) CAPS capsule Take 1 capsule (50,000 Units total) by mouth every 7 (seven) days.   vitamin E 180 MG (400 UNITS) capsule Take 800 Units by mouth daily.   budesonide-formoterol (SYMBICORT) 80-4.5 MCG/ACT inhaler Inhale 2 puffs into the lungs 2 (two) times daily. Bringing in coupon: BIN [id number], Grp [id number]; ID [id number] (Patient not taking: No sig reported)   diclofenac (VOLTAREN) 75 MG EC tablet Take 1 tablet (75 mg total) by mouth 2 (two) times daily. (Patient not taking: No sig reported)   diclofenac sodium (VOLTAREN) 1 % GEL Apply 4 g topically 4 (four) times daily. (Patient not taking: No sig reported)   [DISCONTINUED] fluticasone (FLONASE) 50 MCG/ACT nasal spray Place 2 sprays into both nostrils daily. (Patient not taking: No sig reported)   [DISCONTINUED] HYDROcodone-acetaminophen (NORCO) 5-325 MG tablet Take 1 tablet by mouth every 4 (four) hours as needed for moderate pain.   [DISCONTINUED] omega-3 acid ethyl esters (LOVAZA) 1 g capsule Take 2 capsules (2 g total) by mouth 2 (two) times daily. (Patient not taking: No sig reported)   No facility-administered encounter medications on file  as of 12/05/2020.    Allergies as of 12/05/2020 - Review Complete 12/05/2020  Allergen Reaction Noted   Augmentin [amoxicillin-pot clavulanate] Itching and Nausea And Vomiting 06/03/2012   Codeine Itching and Nausea And Vomiting 06/03/2012   Penicillins Hives and Itching 11/29/2015   Atorvastatin  04/16/2013   Oxycontin [oxycodone hcl] Hives and Itching 05/09/2015    Past Medical History:  Diagnosis Date   Anxiety    Arthritis    Asbestosis (HCC)    Diabetes mellitus without complication (HCC)    Dyslipidemia    GERD (gastroesophageal  reflux disease)    Numbness    right side of tongue since 2016 surgery   Sleep apnea    uses cpap   Tobacco abuse     Past Surgical History:  Procedure Laterality Date   CHOLECYSTECTOMY N/A 01/10/2016   Procedure: LAPAROSCOPIC CHOLECYSTECTOMY;  Surgeon: Darnell Level, MD;  Location: [facility];  Service: General;  Laterality: N/A;   EYE SURGERY Bilateral    cataract surgery with lens implants   FOOT MASS EXCISION Right 35 yrs ago   KYPHOPLASTY N/A 10/10/2020   Procedure: Lumbar Two Kyphoplasty;  Surgeon: Barnett Abu, MD;  Location: [facility] OR;  Service: Neurosurgery;  Laterality: N/A;   right knuckle replaced  11/30/2014   saliva gland removed  04/2014   dr Clovis Riley gore   VASECTOMY      Family History  Problem Relation Age of Onset   Stroke Father        Died age 79   CAD Father    CAD Brother 37       CABG    Social History   Socioeconomic History   Marital status: Married    Spouse name: Not on file   Number of children: 2   Years of education: Not on file   Highest education level: Not on file  Occupational History   Occupation: Ecologist: SELF EMPLOYED  Tobacco Use   Smoking status: Former    Packs/day: 0.00    Types: Cigarettes    Quit date: 01/17/2015    Years since quitting: 5.8   Smokeless tobacco: Never   Tobacco comments:    He has patches.  Vaping Use   Vaping Use: Never used  Substance and Sexual Activity   Alcohol use: Not Currently    Comment: social   Drug use: No   Sexual activity: Not on file  Other Topics Concern   Not on file  Social History Narrative   Lives at home with wife.    Social Determinants of Health   Financial Resource Strain: Not on file  Food Insecurity: Not on file  Transportation Needs: Not on file  Physical Activity: Not on file  Stress: Not on file  Social Connections: Not on file  Intimate Partner Violence: Not on file    Review of Systems  Constitutional:  Negative for fatigue.  Respiratory:  Positive  for apnea.   Psychiatric/Behavioral:  Positive for sleep disturbance.    Vitals:   12/05/20 1448  BP: (!) 120/58  Pulse: 91  Temp: 97.8 F (36.6 C)  SpO2: 97%     Physical Exam Constitutional:      Appearance: He is obese.  HENT:     Head: Normocephalic.     Mouth/Throat:     Mouth: Mucous membranes are moist.  Eyes:     Pupils: Pupils are equal, round, and reactive to light.  Cardiovascular:     Rate and  Rhythm: Normal rate and regular rhythm.     Heart sounds: No murmur heard.   No friction rub.  Pulmonary:     Effort: No respiratory distress.     Breath sounds: No stridor. No wheezing or rhonchi.  Musculoskeletal:     Cervical back: No rigidity or tenderness.  Neurological:     Mental Status: He is alert.  Psychiatric:        Mood and Affect: Mood normal.     Data Reviewed: Compliance shows 100% compliance Set 8-15 95 percentile pressure of 10.8 AHI 1.9  Assessment:  Obstructive sleep apnea  Excellent compliance with CPAP  Unable to sleep without his CPAP  Machine becoming dysfunctional  Plan/Recommendations: Contact medical supply company regarding a new CPAP  I did discuss the possibility of needing a new sleep study which patient is reluctant to have as he feels he cannot sleep without a CPAP  Will contact medical supply company and see what can be done next   Virl Diamond MD [facility] 12/05/2020, 3:06 PM  CC: [name], DO

## 2020-12-09 ENCOUNTER — Telehealth: Payer: Self-pay | Admitting: Pulmonary Disease

## 2020-12-09 NOTE — Telephone Encounter (Signed)
I called [facility] & spoke to [facility].  Order for cpap was received.  It was documented that pt was not eligible for machine due to current machine is not 65 years old - machine was received on 08/16/2016.  I called pt & left him a vm to call me back.

## 2020-12-12 NOTE — Telephone Encounter (Signed)
Spoke to pt and he states machine is making a lot of noise.  I called Brad at [employer] & he will send message to RT team and will have someone to reach out to the pt to troubleshoot the problem.  Gave this info to pt.  Nothing further needed.

## 2021-01-20 ENCOUNTER — Other Ambulatory Visit: Payer: Self-pay | Admitting: Family Medicine

## 2021-01-20 DIAGNOSIS — E1159 Type 2 diabetes mellitus with other circulatory complications: Secondary | ICD-10-CM

## 2021-01-20 DIAGNOSIS — I152 Hypertension secondary to endocrine disorders: Secondary | ICD-10-CM

## 2021-01-25 ENCOUNTER — Telehealth (INDEPENDENT_AMBULATORY_CARE_PROVIDER_SITE_OTHER): Payer: Medicare Other | Admitting: Family Medicine

## 2021-01-25 ENCOUNTER — Encounter: Payer: Self-pay | Admitting: Family Medicine

## 2021-01-25 DIAGNOSIS — Z20828 Contact with and (suspected) exposure to other viral communicable diseases: Secondary | ICD-10-CM

## 2021-01-25 DIAGNOSIS — R6889 Other general symptoms and signs: Secondary | ICD-10-CM | POA: Diagnosis not present

## 2021-01-25 MED ORDER — OSELTAMIVIR PHOSPHATE 75 MG PO CAPS: 75.0000 mg | ORAL_CAPSULE | Freq: Two times a day (BID) | ORAL | 0 refills | Status: AC

## 2021-01-25 MED ORDER — BENZONATATE 100 MG PO CAPS: 100.0000 mg | ORAL_CAPSULE | Freq: Three times a day (TID) | ORAL | 0 refills | Status: DC | PRN

## 2021-01-25 MED ORDER — PREDNISONE 20 MG PO TABS: 40.0000 mg | ORAL_TABLET | Freq: Every day | ORAL | 0 refills | Status: AC

## 2021-01-25 NOTE — Progress Notes (Signed)
Virtual Visit via video Note   Due to COVID-19 pandemic this visit was conducted virtually. This visit type was conducted due to national recommendations for restrictions regarding the COVID-19 Pandemic (e.g. social distancing, sheltering in place) in an effort to limit this patient's exposure and mitigate transmission in our community. All issues noted in this document were discussed and addressed.  A physical exam was not performed with this format.  I connected with  Scott [name]  on 01/25/21 at 0926 by video and verified that I am speaking with the correct person using two identifiers. Scott [name] is currently located at home and no one is currently with him during the visit. The provider, Gabriel Earing, FNP is located in their office at time of visit.  I discussed the limitations, risks, security and privacy concerns of performing an evaluation and management service by video  and the availability of in person appointments. I also discussed with the patient that there may be a patient responsible charge related to this service. The patient expressed understanding and agreed to proceed.  CC: Flu  History and Present Illness: Scott [name] reports flu like symptoms x 1 day. He reports sore throat, cough, HA, malaise, and body aches. He denies fever, chills, shortness of breath, chest pain, nausea, vomiting, or diarrhea. He has contact with multiple family members who have tested positive for the flu. He has been taking nyquil with some improvement.    ROS As per HPI.     Observations/Objective: Alert and oriented. Respirations appear unlabored. No cyanosis noted.   Assessment and Plan: [name] was seen today for influenza.  Diagnoses and all orders for this visit:  Flu-like symptoms Discussed that our office is on limited supply of rapid flu testing. Given his flu like symptoms and known exposure, tamiflu has been sent in for likely influenza infection. Prednisone burst and  tessalon perles also sent in. Discussed symptomatic care and return precautions.  -     oseltamivir (TAMIFLU) 75 MG capsule; Take 1 capsule (75 mg total) by mouth 2 (two) times daily for 5 days. -     predniSONE (DELTASONE) 20 MG tablet; Take 2 tablets (40 mg total) by mouth daily with breakfast for 5 days. -     benzonatate (TESSALON PERLES) 100 MG capsule; Take 1 capsule (100 mg total) by mouth 3 (three) times daily as needed for cough.  Exposure to the flu -     oseltamivir (TAMIFLU) 75 MG capsule; Take 1 capsule (75 mg total) by mouth 2 (two) times daily for 5 days. -     predniSONE (DELTASONE) 20 MG tablet; Take 2 tablets (40 mg total) by mouth daily with breakfast for 5 days. -     benzonatate (TESSALON PERLES) 100 MG capsule; Take 1 capsule (100 mg total) by mouth 3 (three) times daily as needed for cough.    Follow Up Instructions: As needed.     I discussed the assessment and treatment plan with the patient. The patient was provided an opportunity to ask questions and all were answered. The patient agreed with the plan and demonstrated an understanding of the instructions.   The patient was advised to call back or seek an in-person evaluation if the symptoms worsen or if the condition fails to improve as anticipated.  The above assessment and management plan was discussed with the patient. The patient verbalized understanding of and has agreed to the management plan. Patient is aware to call the clinic if symptoms persist or  worsen. Patient is aware when to return to the clinic for a follow-up visit. Patient educated on when it is appropriate to go to the emergency department.   Time call ended: 0935  I provided 9 minutes of face-to-face time during this encounter.    Gabriel Earing, FNP

## 2021-02-06 ENCOUNTER — Other Ambulatory Visit: Payer: Self-pay | Admitting: Family Medicine

## 2021-02-06 DIAGNOSIS — E1169 Type 2 diabetes mellitus with other specified complication: Secondary | ICD-10-CM

## 2021-02-20 DIAGNOSIS — G4733 Obstructive sleep apnea (adult) (pediatric): Secondary | ICD-10-CM | POA: Diagnosis not present

## 2021-03-14 DIAGNOSIS — H40033 Anatomical narrow angle, bilateral: Secondary | ICD-10-CM | POA: Diagnosis not present

## 2021-03-14 DIAGNOSIS — E119 Type 2 diabetes mellitus without complications: Secondary | ICD-10-CM | POA: Diagnosis not present

## 2021-03-14 LAB — HM DIABETES EYE EXAM

## 2021-03-17 ENCOUNTER — Ambulatory Visit (INDEPENDENT_AMBULATORY_CARE_PROVIDER_SITE_OTHER): Payer: Medicare Other | Admitting: *Deleted

## 2021-03-17 DIAGNOSIS — Z23 Encounter for immunization: Secondary | ICD-10-CM | POA: Diagnosis not present

## 2021-03-20 DIAGNOSIS — K219 Gastro-esophageal reflux disease without esophagitis: Secondary | ICD-10-CM | POA: Diagnosis not present

## 2021-03-20 DIAGNOSIS — R1310 Dysphagia, unspecified: Secondary | ICD-10-CM | POA: Diagnosis not present

## 2021-03-20 DIAGNOSIS — K31A11 Gastric intestinal metaplasia without dysplasia, involving the antrum: Secondary | ICD-10-CM | POA: Diagnosis not present

## 2021-03-22 NOTE — Telephone Encounter (Signed)
Will close encounter

## 2021-03-23 ENCOUNTER — Other Ambulatory Visit (HOSPITAL_COMMUNITY): Payer: Self-pay

## 2021-03-23 DIAGNOSIS — R1310 Dysphagia, unspecified: Secondary | ICD-10-CM

## 2021-03-29 ENCOUNTER — Other Ambulatory Visit: Payer: Self-pay

## 2021-03-29 ENCOUNTER — Ambulatory Visit (HOSPITAL_COMMUNITY)
Admission: RE | Admit: 2021-03-29 | Discharge: 2021-03-29 | Disposition: A | Discharge status: Home / Self Care | Payer: Medicare Other | Source: Ambulatory Visit | Attending: Gastroenterology | Admitting: Gastroenterology

## 2021-03-29 DIAGNOSIS — R1310 Dysphagia, unspecified: Secondary | ICD-10-CM | POA: Insufficient documentation

## 2021-04-11 ENCOUNTER — Telehealth: Payer: Self-pay | Admitting: Family Medicine

## 2021-04-11 NOTE — Telephone Encounter (Signed)
lmtcb

## 2021-04-11 NOTE — Telephone Encounter (Signed)
Pt was seen by Roderic Scarce with [facility] on 1/26 and she advised pt to get a MRI. He wants to know what he needs to do next to get this MRI set up. PCP booked. Please call back and advise.

## 2021-04-13 ENCOUNTER — Encounter: Payer: Self-pay | Admitting: Family Medicine

## 2021-04-13 ENCOUNTER — Ambulatory Visit (INDEPENDENT_AMBULATORY_CARE_PROVIDER_SITE_OTHER): Payer: Medicare Other | Admitting: Family Medicine

## 2021-04-13 VITALS — BP 112/72 | HR 96 | Temp 97.5°F | Ht 71.0 in | Wt 215.8 lb

## 2021-04-13 DIAGNOSIS — H903 Sensorineural hearing loss, bilateral: Secondary | ICD-10-CM

## 2021-04-13 DIAGNOSIS — J9801 Acute bronchospasm: Secondary | ICD-10-CM

## 2021-04-13 MED ORDER — BUDESONIDE-FORMOTEROL FUMARATE 160-4.5 MCG/ACT IN AERO: 2.0000 | INHALATION_SPRAY | Freq: Two times a day (BID) | RESPIRATORY_TRACT | 12 refills | Status: DC

## 2021-04-13 MED ORDER — ALBUTEROL SULFATE HFA 108 (90 BASE) MCG/ACT IN AERS: 2.0000 | INHALATION_SPRAY | Freq: Four times a day (QID) | RESPIRATORY_TRACT | 3 refills | Status: DC | PRN

## 2021-04-13 NOTE — Telephone Encounter (Signed)
Left detailed message to call and get appointment scheduled

## 2021-04-13 NOTE — Patient Instructions (Signed)
Will arrange MRI to further evaluate the hearing  Albuterol and increased dose of Symbicort for breathing.

## 2021-04-13 NOTE — Progress Notes (Signed)
Subjective: CC:f/u audiology visit PCP: [name], DO BJY:NWGNFA Scott [name] is a 66 y.o. male presenting to clinic today for:  1. Asymmetric hearing loss  Patient was evaluated by audiology who noted asymmetric hearing loss on exam.  This was thought to be sensorineural in nature and severe in the left ear.  Recommendations was to see ENT and they mention the possibility of MRI, which she would like to go ahead and proceed with before seeing the ear nose and throat doctor.  He does report history of quite a bit of barotrauma to the ears when he worked for [employer].  He really tries to be adamant about protecting his hearing now.  2.  Bronchospasm Patient with history of asbestos exposure.  He reports needing to use his inhaler a little bit more since he was sick with flu, COVID and after he got his pneumococcal shot earlier this year.  He does not use it frequently but it is more than his baseline.  His current albuterol inhaler expired back in 2011 and he is asking for a new one.  He does admit that this causes him to feel jittery when he uses it.  No hemoptysis.  Tolerating exercise at baseline.   ROS: Per HPI  Allergies  Allergen Reactions   Augmentin [Amoxicillin-Pot Clavulanate] Itching and Nausea And Vomiting   Codeine Itching and Nausea And Vomiting   Penicillins Hives and Itching    Has patient had a PCN reaction causing immediate rash, facial/tongue/throat swelling, SOB or lightheadedness with hypotension: No Has patient had a PCN reaction causing severe rash involving mucus membranes or skin necrosis: No Has patient had a PCN reaction that required hospitalization No Has patient had a PCN reaction occurring within the last 10 years: No If all of the above answers are "NO", then may proceed with Cephalosporin use.    Atorvastatin     Myalgias and fatigue   Oxycontin [Oxycodone Hcl] Hives and Itching   Past Medical History:  Diagnosis Date   Anxiety    Arthritis     Asbestosis (HCC)    Diabetes mellitus without complication (HCC)    Dyslipidemia    GERD (gastroesophageal reflux disease)    Numbness    right side of tongue since 2016 surgery   Sleep apnea    uses cpap   Tobacco abuse     Current Outpatient Medications:    albuterol (PROVENTIL HFA;VENTOLIN HFA) 108 (90 Base) MCG/ACT inhaler, Inhale 2 puffs into the lungs every 6 (six) hours as needed for wheezing., Disp: 1 Inhaler, Rfl: 3   Ascorbic Acid (VITAMIN C) 1000 MG tablet, Take 1,000 mg by mouth daily., Disp: , Rfl:    aspirin 81 MG tablet, Take 1 tablet (81 mg total) by mouth daily., Disp: 30 tablet, Rfl:    baclofen (LIORESAL) 10 MG tablet, Take 1 tablet (10 mg total) by mouth 3 (three) times daily., Disp: 30 each, Rfl: 0   benzonatate (TESSALON PERLES) 100 MG capsule, Take 1 capsule (100 mg total) by mouth 3 (three) times daily as needed for cough., Disp: 20 capsule, Rfl: 0   budesonide-formoterol (SYMBICORT) 80-4.5 MCG/ACT inhaler, Inhale 2 puffs into the lungs 2 (two) times daily. Bringing in coupon: BIN 213086, Grp [id number]; ID [id number], Disp: 1 Inhaler, Rfl: 3   Cholecalciferol (VITAMIN D) 50 MCG (2000 UT) CAPS, Take 2,000 Units by mouth daily., Disp: , Rfl:    cycloSPORINE (RESTASIS) 0.05 % ophthalmic emulsion, Place 1 drop into both eyes 2 (  two) times daily. , Disp: , Rfl:    dapagliflozin propanediol (FARXIGA) 10 MG TABS tablet, Take 1 tablet (10 mg total) by mouth daily before breakfast., Disp: 90 tablet, Rfl: 3   diclofenac (VOLTAREN) 75 MG EC tablet, Take 1 tablet (75 mg total) by mouth 2 (two) times daily., Disp: 30 tablet, Rfl: 0   diclofenac sodium (VOLTAREN) 1 % GEL, Apply 4 g topically 4 (four) times daily., Disp: 350 g, Rfl: 1   lisinopril (ZESTRIL) 5 MG tablet, TAKE ONE (1) TABLET EACH DAY, Disp: 90 tablet, Rfl: 1   loratadine (CLARITIN) 10 MG tablet, Take 10 mg by mouth daily as needed for allergies., Disp: , Rfl:    metFORMIN (GLUCOPHAGE) 500 MG tablet, TAKE TWO  TABLETS BY MOUTH TWICE DAILY, Disp: 360 tablet, Rfl: 1   naproxen sodium (ALEVE) 220 MG tablet, Take 440 mg by mouth 2 (two) times daily as needed (pain)., Disp: , Rfl:    Omega-3 1000 MG CAPS, Take 1,000 mg by mouth daily., Disp: , Rfl:    omeprazole (PRILOSEC) 40 MG capsule, Take 40 mg by mouth daily., Disp: , Rfl:    rosuvastatin (CRESTOR) 10 MG tablet, TAKE ONE (1) TABLET EACH DAY, Disp: 90 tablet, Rfl: 1   Semaglutide (RYBELSUS) 7 MG TABS, Take 7 mg by mouth daily., Disp: 30 tablet, Rfl: 0   Vitamin D, Ergocalciferol, (DRISDOL) 1.25 MG (50000 UT) CAPS capsule, Take 1 capsule (50,000 Units total) by mouth every 7 (seven) days., Disp: 12 capsule, Rfl: 3   vitamin E 180 MG (400 UNITS) capsule, Take 800 Units by mouth daily., Disp: , Rfl:  Social History   Socioeconomic History   Marital status: Married    Spouse name: Not on file   Number of children: 2   Years of education: Not on file   Highest education level: Not on file  Occupational History   Occupation: Plumber    Employer: SELF EMPLOYED  Tobacco Use   Smoking status: Former    Packs/day: 0.00    Types: Cigarettes    Quit date: 01/17/2015    Years since quitting: 6.2   Smokeless tobacco: Never   Tobacco comments:    He has patches.  Vaping Use   Vaping Use: Never used  Substance and Sexual Activity   Alcohol use: Not Currently    Comment: social   Drug use: No   Sexual activity: Not on file  Other Topics Concern   Not on file  Social History Narrative   Lives at home with wife.    Social Determinants of Health   Financial Resource Strain: Not on file  Food Insecurity: Not on file  Transportation Needs: Not on file  Physical Activity: Not on file  Stress: Not on file  Social Connections: Not on file  Intimate Partner Violence: Not on file   Family History  Problem Relation Age of Onset   Stroke Father        Died age 52   CAD Father    CAD Brother 83       CABG    Objective: Office vital signs  reviewed. BP 112/72    Pulse 96    Temp (!) 97.5 F (36.4 C)    Ht 5\' 11"  (1.803 m)    Wt 215 lb 12.8 oz (97.9 kg)    SpO2 97%    BMI 30.10 kg/m   Physical Examination:  General: Awake, alert, well nourished, No acute distress HEENT: No facial swelling or  asymmetry Cardio: regular rate and rhythm, S1S2 heard, no murmurs appreciated Pulm: mild expiratory wheezes on the left noted. Normal WOB on room air  Assessment/ Plan: 66 y.o. male   Asymmetric SNHL (sensorineural hearing loss) - Plan: MR Brain W Wo Contrast  Bronchospasm - Plan: albuterol (VENTOLIN HFA) 108 (90 Base) MCG/ACT inhaler, budesonide-formoterol (SYMBICORT) 160-4.5 MCG/ACT inhaler  We reviewed the notes from his hearing evaluation.  They apparently have already referred him to ENT but he wanted to talk to me before he got evaluated there.  I am glad to proceed with imaging to further evaluate.    I consulted with Dr. Posey Pronto with [facility] imaging, who recommended internal auditory canal imaging with contrast and if this was not available MRI of the brain with indication that internal auditory canal imaging needed to be obtained.  Essentially need to rule out vestibular mass and/or brainstem lesion that may be causative to asymmetric hearing loss.  For his bronchospasm I have advanced his Symbicort and renewed albuterol.  Lung exam today was notable for expiratory wheezes  No orders of the defined types were placed in this encounter.  No orders of the defined types were placed in this encounter.    Janora Norlander, DO [place] [phone]

## 2021-04-27 ENCOUNTER — Other Ambulatory Visit: Payer: Self-pay

## 2021-04-27 ENCOUNTER — Ambulatory Visit (HOSPITAL_COMMUNITY)
Admission: RE | Admit: 2021-04-27 | Discharge: 2021-04-27 | Disposition: A | Discharge status: Home / Self Care | Payer: Medicare Other | Source: Ambulatory Visit | Attending: Family Medicine | Admitting: Family Medicine

## 2021-04-27 DIAGNOSIS — H903 Sensorineural hearing loss, bilateral: Secondary | ICD-10-CM | POA: Diagnosis not present

## 2021-04-27 DIAGNOSIS — H9190 Unspecified hearing loss, unspecified ear: Secondary | ICD-10-CM | POA: Diagnosis not present

## 2021-04-27 MED ORDER — GADOBUTROL 1 MMOL/ML IV SOLN
10.0000 mL | Freq: Once | INTRAVENOUS | Status: AC | PRN
  Administered 2021-04-27: 10 mL via INTRAVENOUS

## 2021-05-23 ENCOUNTER — Ambulatory Visit: Payer: Medicare Other | Admitting: Family Medicine

## 2021-05-31 ENCOUNTER — Ambulatory Visit (INDEPENDENT_AMBULATORY_CARE_PROVIDER_SITE_OTHER): Payer: Medicare Other | Admitting: Family Medicine

## 2021-05-31 ENCOUNTER — Encounter: Payer: Self-pay | Admitting: Family Medicine

## 2021-05-31 VITALS — BP 119/64 | HR 78 | Temp 98.0°F | Ht 71.0 in | Wt 214.6 lb

## 2021-05-31 DIAGNOSIS — E1169 Type 2 diabetes mellitus with other specified complication: Secondary | ICD-10-CM | POA: Diagnosis not present

## 2021-05-31 DIAGNOSIS — I152 Hypertension secondary to endocrine disorders: Secondary | ICD-10-CM | POA: Diagnosis not present

## 2021-05-31 DIAGNOSIS — E1159 Type 2 diabetes mellitus with other circulatory complications: Secondary | ICD-10-CM

## 2021-05-31 DIAGNOSIS — E1165 Type 2 diabetes mellitus with hyperglycemia: Secondary | ICD-10-CM

## 2021-05-31 DIAGNOSIS — E785 Hyperlipidemia, unspecified: Secondary | ICD-10-CM

## 2021-05-31 LAB — CMP14+EGFR
ALT: 15 IU/L (ref 0–44)
AST: 16 IU/L (ref 0–40)
Albumin/Globulin Ratio: 1.7 (ref 1.2–2.2)
Albumin: 4.8 g/dL (ref 3.8–4.8)
Alkaline Phosphatase: 59 IU/L (ref 44–121)
BUN/Creatinine Ratio: 14 (ref 10–24)
BUN: 13 mg/dL (ref 8–27)
Bilirubin Total: 1 mg/dL (ref 0.0–1.2)
CO2: 23 mmol/L (ref 20–29)
Calcium: 9.4 mg/dL (ref 8.6–10.2)
Chloride: 99 mmol/L (ref 96–106)
Creatinine, Ser: 0.92 mg/dL (ref 0.76–1.27)
Globulin, Total: 2.8 g/dL (ref 1.5–4.5)
Glucose: 128 mg/dL — ABNORMAL HIGH (ref 70–99)
Potassium: 4.6 mmol/L (ref 3.5–5.2)
Sodium: 137 mmol/L (ref 134–144)
Total Protein: 7.6 g/dL (ref 6.0–8.5)
eGFR: 92 mL/min/{1.73_m2} (ref >59)

## 2021-05-31 LAB — LIPID PANEL
Chol/HDL Ratio: 2.6 ratio (ref 0.0–5.0)
Cholesterol, Total: 110 mg/dL (ref 100–199)
HDL: 43 mg/dL (ref >39)
LDL Chol Calc (NIH): 48 mg/dL (ref 0–99)
Triglycerides: 100 mg/dL (ref 0–149)
VLDL Cholesterol Cal: 19 mg/dL (ref 5–40)

## 2021-05-31 LAB — BAYER DCA HB A1C WAIVED: HB A1C (BAYER DCA - WAIVED): 7.6 % — ABNORMAL HIGH (ref 4.8–5.6)

## 2021-05-31 MED ORDER — RYBELSUS 14 MG PO TABS: 14.0000 mg | ORAL_TABLET | Freq: Every day | ORAL | 0 refills | Status: DC

## 2021-05-31 NOTE — Progress Notes (Signed)
? ?Subjective: ?CC:DM ?PCP: Janora Norlander, DO ?KNL:Scott [name] is a 66 y.o. male presenting to clinic today for: ? ?1. Type 2 Diabetes with hypertension, hyperlipidemia:  ?Patient reports compliance with Rybelsus 7 mg daily, Farxiga 10 mg daily, metformin 500 mg (2 tablets twice daily), lisinopril 5 mg daily and Crestor 10 mg daily.  No hypoglycemic episodes reported.  He reports that he has been having cravings for chocolate.  He has not been exercising as frequently as he had been.  Currently getting Rybelsus through medication assistance program. ? ?Last eye exam: Up-to-date ?Last foot exam: Up-to-date ?Last A1c:  ?Lab Results  ?Component Value Date  ? HGBA1C 6.7 (H) 11/23/2020  ? ?Nephropathy screen indicated?:  Up-to-date/on ACE inhibitor ?Last flu, zoster and/or pneumovax:  ?Immunization History  ?Administered Date(s) Administered  ? Influenza,inj,Quad PF,6+ Mos 12/29/2013, 12/30/2014, 02/14/2016, 01/16/2017, 01/24/2018, 03/09/2019  ? PFIZER(Purple Top)SARS-COV-2 Vaccination 10/23/2019  ? Pneumococcal Conjugate-13 08/22/2020  ? Pneumococcal Polysaccharide-23 03/12/2009, 03/17/2021  ? Tdap 12/30/2014  ? Zoster Recombinat (Shingrix) 03/17/2021  ? Zoster, Live 02/22/2012  ? ? ?ROS: Denies dizziness, LOC, polyuria, polydipsia, unintended weight loss/gain, foot ulcerations, numbness or tingling in extremities, shortness of breath or chest pain.  He denies any genitourinary symptoms including scrotal discoloration, swelling or dysuria. ? ? ? ?ROS: Per HPI ? ?Allergies  ?Allergen Reactions  ? Augmentin [Amoxicillin-Pot Clavulanate] Itching and Nausea And Vomiting  ? Codeine Itching and Nausea And Vomiting  ? Penicillins Hives and Itching  ?  Has patient had a PCN reaction causing immediate rash, facial/tongue/throat swelling, SOB or lightheadedness with hypotension: No ?Has patient had a PCN reaction causing severe rash involving mucus membranes or skin necrosis: No ?Has patient had a PCN reaction that  required hospitalization No ?Has patient had a PCN reaction occurring within the last 10 years: No ?If all of the above answers are "NO", then may proceed with Cephalosporin use. ?  ? Atorvastatin   ?  Myalgias and fatigue  ? Oxycontin [Oxycodone Hcl] Hives and Itching  ? ?Past Medical History:  ?Diagnosis Date  ? Anxiety   ? Arthritis   ? Asbestosis ([place])   ? Diabetes mellitus without complication ([place])   ? Dyslipidemia   ? GERD (gastroesophageal reflux disease)   ? Numbness   ? right side of tongue since 2016 surgery  ? Sleep apnea   ? uses cpap  ? Tobacco abuse   ? ? ?Current Outpatient Medications:  ?  albuterol (VENTOLIN HFA) 108 (90 Base) MCG/ACT inhaler, Inhale 2 puffs into the lungs every 6 (six) hours as needed for wheezing., Disp: 1 each, Rfl: 3 ?  Ascorbic Acid (VITAMIN C) 1000 MG tablet, Take 1,000 mg by mouth daily., Disp: , Rfl:  ?  aspirin 81 MG tablet, Take 1 tablet (81 mg total) by mouth daily., Disp: 30 tablet, Rfl:  ?  benzonatate (TESSALON PERLES) 100 MG capsule, Take 1 capsule (100 mg total) by mouth 3 (three) times daily as needed for cough., Disp: 20 capsule, Rfl: 0 ?  budesonide-formoterol (SYMBICORT) 160-4.5 MCG/ACT inhaler, Inhale 2 puffs into the lungs 2 (two) times daily., Disp: 10.2 g, Rfl: 12 ?  Cholecalciferol (VITAMIN D) 50 MCG (2000 UT) CAPS, Take 2,000 Units by mouth daily., Disp: , Rfl:  ?  cycloSPORINE (RESTASIS) 0.05 % ophthalmic emulsion, Place 1 drop into both eyes 2 (two) times daily. , Disp: , Rfl:  ?  dapagliflozin propanediol (FARXIGA) 10 MG TABS tablet, Take 1 tablet (10 mg total) by mouth daily  before breakfast., Disp: 90 tablet, Rfl: 3 ?  diclofenac (VOLTAREN) 75 MG EC tablet, Take 1 tablet (75 mg total) by mouth 2 (two) times daily., Disp: 30 tablet, Rfl: 0 ?  diclofenac sodium (VOLTAREN) 1 % GEL, Apply 4 g topically 4 (four) times daily., Disp: 350 g, Rfl: 1 ?  lisinopril (ZESTRIL) 5 MG tablet, TAKE ONE (1) TABLET EACH DAY, Disp: 90 tablet, Rfl: 1 ?  loratadine  (CLARITIN) 10 MG tablet, Take 10 mg by mouth daily as needed for allergies., Disp: , Rfl:  ?  metFORMIN (GLUCOPHAGE) 500 MG tablet, TAKE TWO TABLETS BY MOUTH TWICE DAILY, Disp: 360 tablet, Rfl: 1 ?  naproxen sodium (ALEVE) 220 MG tablet, Take 440 mg by mouth 2 (two) times daily as needed (pain)., Disp: , Rfl:  ?  Omega-3 1000 MG CAPS, Take 1,000 mg by mouth daily., Disp: , Rfl:  ?  omeprazole (PRILOSEC) 40 MG capsule, Take 40 mg by mouth daily., Disp: , Rfl:  ?  rosuvastatin (CRESTOR) 10 MG tablet, TAKE ONE (1) TABLET EACH DAY, Disp: 90 tablet, Rfl: 1 ?  Semaglutide (RYBELSUS) 7 MG TABS, Take 7 mg by mouth daily., Disp: 30 tablet, Rfl: 0 ?  Vitamin D, Ergocalciferol, (DRISDOL) 1.25 MG (50000 UT) CAPS capsule, Take 1 capsule (50,000 Units total) by mouth every 7 (seven) days., Disp: 12 capsule, Rfl: 3 ?  vitamin E 180 MG (400 UNITS) capsule, Take 800 Units by mouth daily., Disp: , Rfl:  ?Social History  ? ?Socioeconomic History  ? Marital status: Married  ?  Spouse name: Not on file  ? Number of children: 2  ? Years of education: Not on file  ? Highest education level: Not on file  ?Occupational History  ? Occupation: Plumber  ?  Employer: SELF EMPLOYED  ?Tobacco Use  ? Smoking status: Former  ?  Packs/day: 0.00  ?  Types: Cigarettes  ?  Quit date: 01/17/2015  ?  Years since quitting: 6.3  ? Smokeless tobacco: Never  ? Tobacco comments:  ?  He has patches.  ?Vaping Use  ? Vaping Use: Never used  ?Substance and Sexual Activity  ? Alcohol use: Not Currently  ?  Comment: social  ? Drug use: No  ? Sexual activity: Not on file  ?Other Topics Concern  ? Not on file  ?Social History Narrative  ? Lives at home with wife.   ? ?Social Determinants of Health  ? ?Financial Resource Strain: Not on file  ?Food Insecurity: Not on file  ?Transportation Needs: Not on file  ?Physical Activity: Not on file  ?Stress: Not on file  ?Social Connections: Not on file  ?Intimate Partner Violence: Not on file  ? ?Family History  ?Problem  Relation Age of Onset  ? Stroke Father   ?     Died age 71  ? CAD Father   ? CAD Brother 61  ?     CABG  ? ? ?Objective: ?Office vital signs reviewed. ?BP 119/64   Pulse 78   Temp 98 ?F (36.7 ?C)   Ht 5' 11"  (1.803 m)   Wt 214 lb 9.6 oz (97.3 kg)   SpO2 96%   BMI 29.93 kg/m?  ? ?Physical Examination:  ?General: Awake, alert, well nourished, No acute distress ?HEENT: Sclera white.  Moist mucous membranes ?Cardio: regular rate and rhythm, S1S2 heard, no murmurs appreciated ?Pulm: clear to auscultation bilaterally, no wheezes, rhonchi or rales; normal work of breathing on room air ? ?Assessment/ Plan: ?66  y.o. male  ? ?Uncontrolled type 2 diabetes mellitus with hyperglycemia ([place]) - Plan: Bayer DCA Hb A1c Waived, CMP14+EGFR, Lipid Panel, Semaglutide (RYBELSUS) 14 MG TABS ? ?Hypertension associated with diabetes ([place]) ? ?Hyperlipidemia associated with type 2 diabetes mellitus ([place]) ? ?Sugar not controlled with A1c over 7 now.  Advance Rybelsus to 14 mg.  Continue metformin and Farxiga at current dose.  Check CMP, lipid panel ? ?Blood pressure controlled.  No changes ? ?Check lipid panel as above.  Continue statin ? ?Orders Placed This Encounter  ?Procedures  ? Bayer DCA Hb A1c Waived  ? CMP14+EGFR  ? Lipid Panel  ? ?No orders of the defined types were placed in this encounter. ? ? ? ?Janora Norlander, DO ?[place] ?[phone] ? ?

## 2021-06-15 DIAGNOSIS — Z8719 Personal history of other diseases of the digestive system: Secondary | ICD-10-CM | POA: Diagnosis not present

## 2021-06-15 DIAGNOSIS — K2970 Gastritis, unspecified, without bleeding: Secondary | ICD-10-CM | POA: Diagnosis not present

## 2021-06-15 DIAGNOSIS — K449 Diaphragmatic hernia without obstruction or gangrene: Secondary | ICD-10-CM | POA: Diagnosis not present

## 2021-06-15 DIAGNOSIS — R1310 Dysphagia, unspecified: Secondary | ICD-10-CM | POA: Diagnosis not present

## 2021-06-28 ENCOUNTER — Telehealth: Payer: Self-pay | Admitting: Family Medicine

## 2021-06-28 NOTE — Telephone Encounter (Signed)
Patient said he needs to discuss completing another application with Raynelle Fanning.  ?

## 2021-06-29 ENCOUNTER — Telehealth: Payer: Self-pay

## 2021-06-29 ENCOUNTER — Telehealth: Payer: Self-pay | Admitting: Pharmacist

## 2021-06-29 NOTE — Chronic Care Management (AMB) (Signed)
?  Care Management  ? ?Outreach Note ? ?06/29/2021 ?Name: Scott [name] MRN: [id number] DOB: 07-Jun-1955 ? ?Referred by: [name], DO ?Reason for referral : Care Coordination (Outreach to schedule with Pharm D ) ? ? ?An unsuccessful telephone outreach was attempted today. The patient was referred to the case management team for assistance with care management and care coordination.  ? ?Follow Up Plan:  ?A HIPAA compliant phone message was left for the patient providing contact information and requesting a return call.  ?The care management team will reach out to the patient again over the next 7 days.  ?If patient returns call to provider office, please advise to call Embedded Care Management Care Guide Penne Lash* at [phone]* ? ?Penne Lash, RMA ?Care Guide, Embedded Care Coordination ?[facility]  ?[place], [place] [address] ?Direct Dial: [phone] ?[email or web address] ?Website: [email or web address]  ? ?

## 2021-06-29 NOTE — Telephone Encounter (Signed)
Applications submitted for farxiga, symbicort and rybelsus ?

## 2021-06-30 NOTE — Chronic Care Management (AMB) (Signed)
?  Care Management  ? ?Note ? ?06/30/2021 ?Name: Scott [name] MRN: [id number] DOB: Jul 27, 1955 ? ?Scott [name] is a 66 y.o. year old male who is a primary care patient of [name], DO. I reached out to [employer] by phone today offer care coordination services.  ? ?Scott [name] was given information about care management services today including:  ?Care management services include personalized support from designated clinical staff supervised by his physician, including individualized plan of care and coordination with other care providers ?24/7 contact phone numbers for assistance for urgent and routine care needs. ?The patient may stop care management services at any time by phone call to the office staff. ? ?Patient agreed to services and verbal consent obtained.  ? ?Follow up plan: ?Telephone appointment with care management team member scheduled for:07/21/2021 ? ?Penne Lash, RMA ?Care Guide, Embedded Care Coordination ?[facility]  ?[place], [place] [address] ?Direct Dial: [phone] ?[name] ?Website: [email or web address]  ? ?

## 2021-07-03 ENCOUNTER — Other Ambulatory Visit: Payer: Self-pay | Admitting: Family Medicine

## 2021-07-03 DIAGNOSIS — E1159 Type 2 diabetes mellitus with other circulatory complications: Secondary | ICD-10-CM

## 2021-07-03 DIAGNOSIS — E1169 Type 2 diabetes mellitus with other specified complication: Secondary | ICD-10-CM

## 2021-07-04 ENCOUNTER — Telehealth: Payer: Self-pay

## 2021-07-04 NOTE — Telephone Encounter (Signed)
Received notification from [employer] regarding approval for RYBELSUS 14MG . Patient assistance approved from 07/03/21 to 02/08/22. ? ?4 MONTH SUPPLY OF MEDICATION IS PROCESSING FOR SHIPMENT ? ?Phone: [phone] ? ?

## 2021-07-12 ENCOUNTER — Telehealth: Payer: Self-pay | Admitting: Pharmacist

## 2021-07-12 NOTE — Telephone Encounter (Signed)
Please let patient know: ?His Rybelsus 14mg  tablet (4 month supply)- patient assistance shipment nordisk) has arrived and is up front for pick up  ? ?Thank you! ?

## 2021-07-12 NOTE — Telephone Encounter (Signed)
Pt aware.

## 2021-07-21 ENCOUNTER — Ambulatory Visit (INDEPENDENT_AMBULATORY_CARE_PROVIDER_SITE_OTHER): Payer: Medicare Other | Admitting: Pharmacist

## 2021-07-21 DIAGNOSIS — J9801 Acute bronchospasm: Secondary | ICD-10-CM

## 2021-07-21 DIAGNOSIS — E785 Hyperlipidemia, unspecified: Secondary | ICD-10-CM

## 2021-07-21 DIAGNOSIS — I152 Hypertension secondary to endocrine disorders: Secondary | ICD-10-CM

## 2021-07-21 MED ORDER — BUDESONIDE-FORMOTEROL FUMARATE 160-4.5 MCG/ACT IN AERO: 2.0000 | INHALATION_SPRAY | Freq: Two times a day (BID) | RESPIRATORY_TRACT | 12 refills | Status: DC

## 2021-07-21 NOTE — Patient Instructions (Addendum)
Visit Information  Current Barriers:  Unable to independently afford treatment regimen Unable to maintain control of T2DM  Pharmacist Clinical Goal(s):  patient will verbalize ability to afford treatment regimen maintain control of T2DM  as evidenced by GOAL A1C  through collaboration with PharmD and provider.    Interventions: 1:1 collaboration with [name], DO regarding development and update of comprehensive plan of care as evidenced by provider attestation and co-signature Inter-disciplinary care team collaboration (see longitudinal plan of care) Comprehensive medication review performed; medication list updated in electronic medical record  Diabetes: New goal. Uncontrolled-a1c 7.6 (was <7%), GFR 92; current treatment: RYBELSUS, FARXIGA, METFORMIN;  Patient admits to not eating great Will continue to work on that; interested in [place], but not covered due to not being on insulin and not having hypoglycemia Consider transition from rybelsus to ozempic (more potent) Current glucose readings: fasting glucose: <150, post prandial glucose: n/a Discussed meal planning options and Plate method for healthy eating Avoid sugary drinks and desserts Incorporate balanced protein, non starchy veggies, 1 serving of carbohydrate with each meal Increase water intake Increase physical activity as able Current exercise: n/a, encouraged LDL AT GOAL 48 GFR STABLE Recommended potential transition to ozempic (would have to redo patient assistance) Assessed patient finances. [place] for symbicort, farxiga. Enrolled in [employer]isk for Rybelsus  Patient Goals/Self-Care Activities patient will:  - take medications as prescribed as evidenced by patient report and record review collaborate with provider on medication access solutions target a minimum of 150 minutes of moderate intensity exercise weekly engage in dietary modifications by FOLLOWING A HEART HEALTHY  DIET/HEALTHY PLATE METHOD    Plan: Telephone follow up appointment with care management team member scheduled for:  3 months  Signature Kieth Brightly, PharmD, BCPS Clinical Pharmacist, [facility]  II Phone [phone]   Please call the care guide team at [phone] if you need to cancel or reschedule your appointment.   Patient verbalizes understanding of instructions and care plan provided today and agrees to view in MyChart. Active MyChart status and patient understanding of how to access instructions and care plan via MyChart confirmed with patient.

## 2021-07-21 NOTE — Progress Notes (Signed)
Chronic Care Management Pharmacy Note  07/21/2021 Name:  Scott [name] MRN:  [id number] DOB:  [birth year]  Summary:  Diabetes: New goal. Uncontrolled-a1c 7.6 (was <7%), GFR 92; current treatment: RYBELSUS, FARXIGA, [place];  Patient admits to not eating great Will continue to work on that; interested in [place], but not covered due to not being on insulin and not having hypoglycemia Consider transition from rybelsus to ozempic (more potent) Current glucose readings: fasting glucose: <150, post prandial glucose: n/a Discussed meal planning options and Plate method for healthy eating Avoid sugary drinks and desserts Incorporate balanced protein, non starchy veggies, 1 serving of carbohydrate with each meal Increase water intake Increase physical activity as able Current exercise: n/a, encouraged LDL AT GOAL 48 GFR STABLE Recommended potential transition to ozempic (would have to redo patient assistance) Assessed patient finances. [place] symbicort, farxiga. Enrolled in [employer]isk for Rybelsus   Subjective: Scott [name] is an 66 y.o. year old male who is a primary patient of Janora Norlander, DO.  The CCM team was consulted for assistance with disease management and care coordination needs.    Engaged with patient by telephone for initial visit in response to provider referral for pharmacy case management and/or care coordination services.   Consent to Services:  The patient was given the following information about Chronic Care Management services today, agreed to services, and gave verbal consent: 1. CCM service includes personalized support from designated clinical staff supervised by the primary care provider, including individualized plan of care and coordination with other care providers 2. 24/7 contact phone numbers for assistance for urgent and routine care needs. 3. Service will only be billed when office clinical staff spend 20 minutes or  more in a month to coordinate care. 4. Only one practitioner may furnish and bill the service in a calendar month. 5.The patient may stop CCM services at any time (effective at the end of the month) by phone call to the office staff. 6. The patient will be responsible for cost sharing (co-pay) of up to 20% of the service fee (after annual deductible is met). Patient agreed to services and consent obtained.  Patient Care Team: Janora Norlander, DO as PCP - General (Family Medicine) Clarene Essex, MD as Consulting Physician (Gastroenterology) Magdalen Spatz, NP as Nurse Practitioner (Pulmonary Disease) Harlen Labs, MD as Referring Physician (Optometry) Lavera Guise, [facility] as [place] [facility] (Pharmacist)  Objective:  Lab Results  Component Value Date   CREATININE 0.92 05/31/2021   CREATININE 0.90 10/07/2020   CREATININE 0.94 08/18/2020    Lab Results  Component Value Date   HGBA1C 7.6 (H) 05/31/2021   Last diabetic Eye exam:  Lab Results  Component Value Date/Time   HMDIABEYEEXA No Retinopathy 03/14/2021 12:00 AM    Last diabetic Foot exam: No results found for: HMDIABFOOTEX      Component Value Date/Time   CHOL 110 05/31/2021 1108   TRIG 100 05/31/2021 1108   TRIG 446 (H) 05/09/2015 0941   HDL 43 05/31/2021 1108   HDL 34 (L) 05/09/2015 0941   CHOLHDL 2.6 05/31/2021 1108   LDLCALC 48 05/31/2021 1108   LDLCALC 94 12/16/2013 1013       Latest Ref Rng & Units 05/31/2021   11:08 AM 08/18/2020   11:57 AM 10/27/2019   10:27 AM  Hepatic Function  Total Protein 6.0 - 8.5 g/dL 7.6   7.3   7.0    Albumin 3.8 -  4.8 g/dL 4.8   4.6   4.6    AST 0 - 40 IU/L _0 ALT 0 - 44 IU/L _1 Alk Phosphatase 44 - 121 IU/L 59   56   54    Total Bilirubin 0.0 - 1.2 mg/dL 1.0   0.7   0.6      No results found for: TSH, FREET4     Latest Ref Rng & Units 08/18/2020   11:57 AM 10/27/2019   10:27 AM 05/08/2018   11:48 AM  CBC  WBC 3.4 - 10.8  x10E3/uL 5.0   5.3   6.2    Hemoglobin 13.0 - 17.7 g/dL 15.2   15.6   14.5    Hematocrit 37.5 - 51.0 % 45.1   46.2   42.9    Platelets 150 - 450 x10E3/uL 147   171   227      Lab Results  Component Value Date/Time   VD25OH 46.3 05/08/2018 11:48 AM   VD25OH 45.7 11/28/2017 03:15 PM    Clinical ASCVD: No  The ASCVD Risk score (Arnett DK, et al., 2019) failed to calculate for the following reasons:   The valid total cholesterol range is 130 to 320 mg/dL    Other: (CHADS2VASc if Afib, PHQ9 if depression, MMRC or CAT for COPD, ACT, DEXA)  Social History   Tobacco Use  Smoking Status Former   Packs/day: 0.00   Types: Cigarettes   Quit date: 01/17/2015   Years since quitting: 6.5  Smokeless Tobacco Never  Tobacco Comments   He has patches.   BP Readings from Last 3 Encounters:  05/31/21 119/64  04/13/21 112/72  12/05/20 (!) 120/58   Pulse Readings from Last 3 Encounters:  05/31/21 78  04/13/21 96  12/05/20 91   Wt Readings from Last 3 Encounters:  05/31/21 214 lb 9.6 oz (97.3 kg)  04/13/21 215 lb 12.8 oz (97.9 kg)  12/05/20 217 lb 9.6 oz (98.7 kg)    Assessment: Review of patient past medical history, allergies, medications, health status, including review of consultants reports, laboratory and other test data, was performed as part of comprehensive evaluation and provision of chronic care management services.   SDOH:  (Social Determinants of Health) assessments and interventions performed:    CCM Care Plan  Allergies  Allergen Reactions   Augmentin [Amoxicillin-Pot Clavulanate] Itching and Nausea And Vomiting   Codeine Itching and Nausea And Vomiting   Penicillins Hives and Itching    Has patient had a PCN reaction causing immediate rash, facial/tongue/throat swelling, SOB or lightheadedness with hypotension: No Has patient had a PCN reaction causing severe rash involving mucus membranes or skin necrosis: No Has patient had a PCN reaction that required  hospitalization No Has patient had a PCN reaction occurring within the last 10 years: No If all of the above answers are "NO", then may proceed with Cephalosporin use.    Atorvastatin     Myalgias and fatigue   Oxycontin [Oxycodone Hcl] Hives and Itching    Medications Reviewed Today     Reviewed by Lavera Guise, [facility] (Pharmacist) on 07/21/21 at 1104  Med List Status: <None>   Medication Order Taking? Sig Documenting Provider Last Dose Status Informant  albuterol (VENTOLIN HFA) 108 (90 Base) MCG/ACT inhaler [id number] No Inhale 2 puffs into the lungs every 6 (six) hours as needed for wheezing. Janora Norlander, DO Taking Active  Ascorbic Acid (VITAMIN C) 1000 MG tablet [id number] No Take 1,000 mg by mouth daily. [provider] Taking Active Self  aspirin 81 MG tablet [id number] No Take 1 tablet (81 mg total) by mouth daily. Cherre Robins, RPH-CPP Taking Active Self           Med Note (COX, HEATHER C   Thu Nov 15, 2016  9:37 AM)    benzonatate (TESSALON PERLES) 100 MG capsule [id number] No Take 1 capsule (100 mg total) by mouth 3 (three) times daily as needed for cough. Gwenlyn Perking, FNP Taking Active   budesonide-formoterol [facility]) 160-4.5 MCG/ACT inhaler [id number] No Inhale 2 puffs into the lungs 2 (two) times daily. Ronnie Doss M, DO Taking Active   Cholecalciferol (VITAMIN D) 50 MCG (2000 UT) CAPS [id number] No Take 2,000 Units by mouth daily. [provider] Taking Active Self  cycloSPORINE (RESTASIS) 0.05 % ophthalmic emulsion [id number] No Place 1 drop into both eyes 2 (two) times daily.  [provider] Taking Active Self  dapagliflozin propanediol (FARXIGA) 10 MG TABS tablet [id number] No Take 1 tablet (10 mg total) by mouth daily before breakfast. Janora Norlander, DO Taking Active Self           Med Note Blanca Friend, JULIE D   Wed Jul 12, 2021  9:00 AM) Via [place] patient [place]   diclofenac (VOLTAREN) 75 MG EC tablet [id number] No  Take 1 tablet (75 mg total) by mouth 2 (two) times daily. Sharion Balloon, FNP Taking Active Self  diclofenac sodium (VOLTAREN) 1 % GEL [id number] No Apply 4 g topically 4 (four) times daily. Janora Norlander, DO Taking Active Self  lisinopril (ZESTRIL) 5 MG tablet [id number]  TAKE ONE (1) TABLET EACH DAY Gottschalk, Ashly M, [place]  Active   loratadine (CLARITIN) 10 MG tablet [id number] No Take 10 mg by mouth daily as needed for allergies. [provider] Taking Active Self  metFORMIN (GLUCOPHAGE) 500 MG tablet [id number]  TAKE TWO TABLETS BY MOUTH TWICE DAILY Ronnie Doss M, DO  Active   naproxen sodium (ALEVE) 220 MG tablet [id number] No Take 440 mg by mouth 2 (two) times daily as needed (pain). [provider] Taking Active Self  Omega-3 1000 MG CAPS [id number] No Take 1,000 mg by mouth daily. [provider] Taking Active Self  omeprazole (PRILOSEC) 40 MG capsule [id number] No Take 40 mg by mouth daily. [provider] Taking Active   rosuvastatin (CRESTOR) 10 MG tablet [id number]  TAKE ONE (1) TABLET EACH DAY Gottschalk, Ashly M, DO  Active   Semaglutide (RYBELSUS) 14 MG TABS [id number]  Take 1 tablet (14 mg total) by mouth daily. Ronnie Doss M, DO  Active            Med Note (PRUITT, JULIE D   Thu Jun 29, 2021  9:00 AM) Via [employer] patient assistance program    Vitamin D, Ergocalciferol, (DRISDOL) 1.25 MG (50000 UT) CAPS capsule [id number] No Take 1 capsule (50,000 Units total) by mouth every 7 (seven) days. Chipper Herb, MD Taking Active Self  vitamin E 180 MG (400 UNITS) capsule [id number] No Take 800 Units by mouth daily. [provider] Taking Active Self            Patient Active Problem List   Diagnosis Date Noted   Hypertension associated with diabetes ([place]) 12/02/2018   Hyperlipidemia associated with type 2 diabetes mellitus ([place]) 12/02/2018   Primary insomnia 06/14/2017   Essential hypertension, benign 06/14/2017  OSA  (obstructive sleep apnea) 08/09/2016   Cholelithiasis with chronic cholecystitis 01/09/2016   Chest pain 11/29/2015   Vitamin D deficiency 10/06/2015   Hyperlipidemia 02/12/2013   Tobacco use disorder 01/05/2013   Back pain, acute 12/13/2012   Elevated blood pressure 12/04/2012   H/O asbestosis 12/04/2012   Erectile dysfunction 12/04/2012   Neuropathy of both upper extremities 12/04/2012   Degenerative arthritis of cervical spine 12/04/2012   BPH (benign prostatic hyperplasia) 12/04/2012   GERD (gastroesophageal reflux disease) 06/03/2012   Diabetes mellitus type 2, controlled ([place]) 06/03/2012   Generalized anxiety disorder 06/03/2012    Immunization History  Administered Date(s) Administered   Influenza,inj,Quad PF,6+ Mos 12/29/2013, 12/30/2014, 02/14/2016, 01/16/2017, 01/24/2018, 03/09/2019   PFIZER(Purple Top)SARS-COV-2 Vaccination 10/23/2019   Pneumococcal Conjugate-13 08/22/2020   Pneumococcal Polysaccharide-23 03/12/2009, 03/17/2021   Tdap 12/30/2014   Zoster Recombinat (Shingrix) 03/17/2021   Zoster, Live 02/22/2012    Conditions to be addressed/monitored: HLD and DMII  Care Plan : PHARMD MEDICATION MANAGEMENT  Updates made by Lavera Guise, [place] since 07/27/2021 12:00 AM     Problem: DISEASE PROGRESSION PREVENTION      Long-Range Goal: T2DM   Note:   Current Barriers:  Unable to independently afford treatment regimen Unable to maintain control of T2DM  Pharmacist Clinical Goal(s):  patient will verbalize ability to afford treatment regimen maintain control of T2DM  as evidenced by GOAL A1C  through collaboration with PharmD and provider.    Interventions: 1:1 collaboration with Janora Norlander, DO regarding development and update of comprehensive plan of care as evidenced by provider attestation and co-signature Inter-disciplinary care team collaboration (see longitudinal plan of care) Comprehensive medication review performed; medication list updated  in electronic medical record  Diabetes: New goal. Uncontrolled-a1c 7.6 (was <7%), GFR 92; current treatment: RYBELSUS, FARXIGA, [place];  Patient admits to not eating great Will continue to work on that; interested in [place], but not covered due to not being on insulin and not having hypoglycemia Consider transition from rybelsus to ozempic (more potent) Current glucose readings: fasting glucose: <150, post prandial glucose: n/a Discussed meal planning options and Plate method for healthy eating Avoid sugary drinks and desserts Incorporate balanced protein, non starchy veggies, 1 serving of carbohydrate with each meal Increase water intake Increase physical activity as able Current exercise: n/a, encouraged LDL AT GOAL 48 GFR STABLE Recommended potential transition to ozempic (would have to redo patient assistance) Assessed patient finances. [place] for symbicort, farxiga. Enrolled in novo nordisk for Rybelsus  Patient Goals/Self-Care Activities patient will:  - take medications as prescribed as evidenced by patient report and record review collaborate with provider on medication access solutions target a minimum of 150 minutes of moderate intensity exercise weekly engage in dietary modifications by   FOLLOWING A HEART HEALTHY DIET/HEALTHY PLATE METHOD      Follow Up:  Patient agrees to Care Plan and Follow-up.  Plan: Telephone follow up appointment with care management team member scheduled for:  3 MONTHS   Regina Eck, PharmD, BCPS Clinical Pharmacist, [place]  II Phone [phone]

## 2021-07-21 NOTE — Telephone Encounter (Signed)
Can you make sure az&me has symbicort rx as well ( I escribed again) ? ?Patient already received farxiga and rybelsus ? ?Thank you! ?

## 2021-07-26 MED ORDER — DAPAGLIFLOZIN PROPANEDIOL 10 MG PO TABS: 10.0000 mg | ORAL_TABLET | Freq: Every day | ORAL | 3 refills | Status: DC

## 2021-07-31 ENCOUNTER — Telehealth: Payer: Self-pay

## 2021-07-31 NOTE — Telephone Encounter (Signed)
Received notification from [place]&ME regarding approval for SYMBICORT 160. Patient assistance approved from 07/31/21 to 03/11/22.  MEDICATION WILL SHIP TO PT IN 10-14 BUSINESS DAYS   Phone: [phone]

## 2021-08-09 DIAGNOSIS — Z7984 Long term (current) use of oral hypoglycemic drugs: Secondary | ICD-10-CM

## 2021-08-09 DIAGNOSIS — Z87891 Personal history of nicotine dependence: Secondary | ICD-10-CM

## 2021-08-09 DIAGNOSIS — E785 Hyperlipidemia, unspecified: Secondary | ICD-10-CM

## 2021-08-09 DIAGNOSIS — E1159 Type 2 diabetes mellitus with other circulatory complications: Secondary | ICD-10-CM

## 2021-08-09 DIAGNOSIS — I152 Hypertension secondary to endocrine disorders: Secondary | ICD-10-CM

## 2021-08-09 DIAGNOSIS — E1169 Type 2 diabetes mellitus with other specified complication: Secondary | ICD-10-CM

## 2021-08-23 ENCOUNTER — Ambulatory Visit (INDEPENDENT_AMBULATORY_CARE_PROVIDER_SITE_OTHER): Payer: Medicare Other

## 2021-08-23 VITALS — Ht 71.0 in | Wt 214.0 lb

## 2021-08-23 DIAGNOSIS — Z0000 Encounter for general adult medical examination without abnormal findings: Secondary | ICD-10-CM | POA: Diagnosis not present

## 2021-08-23 NOTE — Progress Notes (Cosign Needed)
Subjective:   Scott [name] is a 66 y.o. male who presents for an Initial Medicare Annual Wellness Visit. Virtual Visit via Telephone Note  I connected with  Scott [name] on 08/23/21 at 10:30 AM EDT by telephone and verified that I am speaking with the correct person using two identifiers.  Location: Patient: HOME Provider: [facility] Persons participating in the virtual visit: patient/Nurse Health Advisor   I discussed the limitations, risks, security and privacy concerns of performing an evaluation and management service by telephone and the availability of in person appointments. The patient expressed understanding and agreed to proceed.  Interactive audio and video telecommunications were attempted between this nurse and patient, however failed, due to patient having technical difficulties OR patient did not have access to video capability.  We continued and completed visit with audio only.  Some vital signs may be absent or patient reported.   Chriss Driver, LPN  Review of Systems     Cardiac Risk Factors include: advanced age (>21men, >45 women);hypertension;diabetes mellitus;dyslipidemia;male gender;sedentary lifestyle     Objective:    Today's Vitals   08/23/21 1030 08/23/21 1035  Weight: 214 lb (97.1 kg)   Height: 5\' 11"  (1.803 m)   PainSc:  5    Body mass index is 29.85 kg/m.     08/23/2021   10:45 AM 10/07/2020   11:16 AM 01/10/2016    5:57 AM 01/05/2016    1:41 PM 11/30/2015   12:34 AM 11/29/2015    7:26 PM  Advanced Directives  Does Patient Have a Medical Advance Directive? No No No No No No  Would patient like information on creating a medical advance directive? No - Patient declined Yes (MAU/Ambulatory/Procedural Areas - Information given) No - patient declined information No - patient declined information Yes - Educational materials given Yes - Educational materials given    Current Medications (verified) Outpatient Encounter Medications as of  08/23/2021  Medication Sig   albuterol (VENTOLIN HFA) 108 (90 Base) MCG/ACT inhaler Inhale 2 puffs into the lungs every 6 (six) hours as needed for wheezing.   Ascorbic Acid (VITAMIN C) 1000 MG tablet Take 1,000 mg by mouth daily.   aspirin 81 MG tablet Take 1 tablet (81 mg total) by mouth daily.   benzonatate (TESSALON PERLES) 100 MG capsule Take 1 capsule (100 mg total) by mouth 3 (three) times daily as needed for cough.   budesonide-formoterol (SYMBICORT) 160-4.5 MCG/ACT inhaler Inhale 2 puffs into the lungs 2 (two) times daily.   Cholecalciferol (VITAMIN D) 50 MCG (2000 UT) CAPS Take 2,000 Units by mouth daily.   cycloSPORINE (RESTASIS) 0.05 % ophthalmic emulsion Place 1 drop into both eyes 2 (two) times daily.    dapagliflozin propanediol (FARXIGA) 10 MG TABS tablet Take 1 tablet (10 mg total) by mouth daily before breakfast.   diclofenac (VOLTAREN) 75 MG EC tablet Take 1 tablet (75 mg total) by mouth 2 (two) times daily.   diclofenac sodium (VOLTAREN) 1 % GEL Apply 4 g topically 4 (four) times daily.   lisinopril (ZESTRIL) 5 MG tablet TAKE ONE (1) TABLET EACH DAY   loratadine (CLARITIN) 10 MG tablet Take 10 mg by mouth daily as needed for allergies.   metFORMIN (GLUCOPHAGE) 500 MG tablet TAKE TWO TABLETS BY MOUTH TWICE DAILY   naproxen sodium (ALEVE) 220 MG tablet Take 440 mg by mouth 2 (two) times daily as needed (pain).   Omega-3 1000 MG CAPS Take 1,000 mg by mouth daily.   omeprazole (PRILOSEC) 40  MG capsule Take 40 mg by mouth daily.   rosuvastatin (CRESTOR) 10 MG tablet TAKE ONE (1) TABLET EACH DAY   Semaglutide (RYBELSUS) 14 MG TABS Take 1 tablet (14 mg total) by mouth daily.   Vitamin D, Ergocalciferol, (DRISDOL) 1.25 MG (50000 UT) CAPS capsule Take 1 capsule (50,000 Units total) by mouth every 7 (seven) days.   vitamin E 180 MG (400 UNITS) capsule Take 800 Units by mouth daily.   No facility-administered encounter medications on file as of 08/23/2021.    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[place])    Asthma    Diabetes mellitus without complication ([place])    Dyslipidemia    GERD (gastroesophageal reflux disease)    Numbness    right side of tongue since 2016 surgery   Sleep apnea    uses cpap   Tobacco abuse    Past Surgical History:  Procedure Laterality Date   CHOLECYSTECTOMY N/A 01/10/2016   Procedure: LAPAROSCOPIC CHOLECYSTECTOMY;  Surgeon: Armandina Gemma, MD;  Location: [facility];  Service: General;  Laterality: N/A;   EYE SURGERY Bilateral    cataract surgery with lens implants   FOOT MASS EXCISION Right 35 yrs ago   KYPHOPLASTY N/A 10/10/2020   Procedure: Lumbar Two Kyphoplasty;  Surgeon: Kristeen Miss, MD;  Location: [facility];  Service: Neurosurgery;  Laterality: N/A;   right knuckle replaced  11/30/2014   saliva gland removed  04/2014   dr Alroy Dust gore   SPINE SURGERY     VASECTOMY     Family History  Problem Relation Age of Onset   Stroke Father        Died age 44   CAD Father    CAD Brother 56       CABG   Social History   Socioeconomic History   Marital status: Married    Spouse name: Not on file   Number of children: 2   Years of education: Not on file   Highest education level: Not on file  Occupational History   Occupation: Programmer, systems: SELF EMPLOYED  Tobacco Use   Smoking status: Former    Packs/day: 0.00    Types: Cigarettes    Quit date: 01/17/2015    Years since quitting: 6.6   Smokeless tobacco: Never   Tobacco comments:    He has patches.  Vaping Use   Vaping Use: Never used  Substance and Sexual Activity   Alcohol use: Not Currently    Comment: social   Drug use: No   Sexual activity: Not on file  Other Topics Concern   Not on file  Social History Narrative   Lives at home with wife.    Social Determinants of  Health   Financial Resource Strain: Low Risk  (08/23/2021)   Overall Financial Resource Strain (CARDIA)    Difficulty of Paying Living Expenses: Not hard at all  Food Insecurity: No Food Insecurity (08/23/2021)   Hunger Vital Sign    Worried About Running Out of Food in the Last Year: Never true    Ran Out of Food in the Last Year: Never true  Transportation Needs: No Transportation Needs (08/23/2021)   PRAPARE - Hydrologist (Medical): No    Lack of Transportation (Non-Medical): No  Physical Activity: Sufficiently Active (08/23/2021)  Exercise Vital Sign    Days of Exercise per Week: 5 days    Minutes of Exercise per Session: 50 min  Stress: No Stress Concern Present (08/23/2021)   [place]    Feeling of Stress : Only a little  Social Connections: Moderately Integrated (08/23/2021)   Social Connection and Isolation Panel [NHANES]    Frequency of Communication with Friends and Family: More than three times a week    Frequency of Social Gatherings with Friends and Family: Once a week    Attends Religious Services: 1 to 4 times per year    Active Member of [employer] or Organizations: No    Attends Archivist Meetings: Never    Marital Status: Married    Tobacco Counseling Counseling given: Not Answered Tobacco comments: He has patches.   Clinical Intake:  Pre-visit preparation completed: Yes  Pain : 0-10 Pain Score: 5  Pain Type: Chronic pain Pain Location: Shoulder Pain Descriptors / Indicators: Aching, Discomfort Pain Onset: More than a month ago Pain Frequency: Intermittent     BMI - recorded: 29.85 Nutritional Status: BMI 25 -29 Overweight Diabetes: Yes  How often do you need to have someone help you when you read instructions, pamphlets, or other written materials from your doctor or pharmacy?: 1 - Never  Diabetic?Nutrition Risk Assessment:  Has the patient had  any N/V/D within the last 2 months?  Yes  Does the patient have any non-healing wounds?  No  Has the patient had any unintentional weight loss or weight gain?  No   Diabetes:  Is the patient diabetic?  Yes  If diabetic, was a CBG obtained today?  No  Did the patient bring in their glucometer from home?  No  How often do you monitor your CBG's? Daily.   Financial Strains and Diabetes Management:  Are you having any financial strains with the device, your supplies or your medication? No .  Does the patient want to be seen by Chronic Care Management for management of their diabetes?  No  Would the patient like to be referred to a Nutritionist or for Diabetic Management?  No   Diabetic Exams:  Diabetic Eye Exam: Completed 04/2020. Pt has been advised about the importance in completing this exam. A referral has been placed today. Message sent to referral coordinator for scheduling purposes. Advised pt to expect a call from office referred to regarding appt.  Diabetic Foot Exam: Completed 08/22/2020. Pt has been advised about the importance in completing this exam.   Interpreter Needed?: No  Information entered by :: mj Auriah Hollings, lpn   Activities of Daily Living    08/23/2021   10:46 AM 08/19/2021   11:38 PM  In your present state of health, do you have any difficulty performing the following activities:  Hearing? 0 0  Vision? 0 0  Difficulty concentrating or making decisions? 0 0  Walking or climbing stairs? 0 0  Dressing or bathing? 0 0  Doing errands, shopping? 0 0  Preparing Food and eating ? N N  Using the Toilet? N N  In the past six months, have you accidently leaked urine? N N  Do you have problems with loss of bowel control? N N  Managing your Medications? N N  Managing your Finances? N N  Housekeeping or managing your Housekeeping? N N    Patient Care Team: Janora Norlander, DO as PCP - General (Family Medicine) Clarene Essex, MD as  Consulting Physician  (Gastroenterology) Magdalen Spatz, NP as Nurse Practitioner (Pulmonary Disease) Harlen Labs, MD as Referring Physician (Optometry) Lavera Guise, [facility] as Pharmacist (Family Medicine)  Indicate any recent Medical Services you may have received from other than [facility] providers in the past year (date may be approximate).     Assessment:   This is a routine wellness examination for [facility].  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 Vision Screening - Comments:: Readers since Cataract surgery. [facility] [place]. 04/2021.  Dietary issues and exercise activities discussed: Current Exercise Habits: Home exercise routine, Type of exercise: walking, Time (Minutes): 50, Frequency (Times/Week): 5, Weekly Exercise (Minutes/Week): 250, Intensity: Mild, Exercise limited by: cardiac condition(s);orthopedic condition(s)   Goals Addressed             This Visit's Progress    Exercise 3x per week (30 min per time)       Continue to exercise and stay healthy.       Depression Screen    08/23/2021   10:42 AM 05/31/2021   10:29 AM 04/13/2021    4:18 PM 11/23/2020   10:07 AM 09/26/2020    9:08 AM 08/22/2020   11:10 AM 10/29/2019   10:48 AM  PHQ 2/9 Scores  PHQ - 2 Score 0 0 0 0 0 0 0    Fall Risk    08/23/2021   10:45 AM 08/19/2021   11:38 PM 05/31/2021   10:29 AM 04/13/2021    4:18 PM 11/23/2020   10:07 AM  Fall Risk   Falls in the past year? 1 1 1  0 0  Number falls in past yr: 0 0 0    Injury with Fall? 1 1 1     Risk for fall due to : History of fall(s);Impaired balance/gait  History of fall(s)    Follow up Falls prevention discussed  Education provided      FALL RISK PREVENTION PERTAINING TO THE HOME:  Any stairs in or around the home? Yes  If so, are there any without handrails? No  Home free of loose throw rugs in walkways, pet beds, electrical cords, etc? Yes  Adequate lighting in your home to reduce risk of falls? Yes   ASSISTIVE DEVICES  UTILIZED TO PREVENT FALLS:  Life alert? No  Use of a cane, walker or w/c? No  Grab bars in the bathroom? No  Shower chair or bench in shower? Yes  Elevated toilet seat or a handicapped toilet? Yes   TIMED UP AND GO:  Was the test performed? No .  Phone visit.  Cognitive Function:        08/23/2021   10:50 AM  6CIT Screen  What Year? 0 points  What month? 0 points  What time? 0 points  Count back from 20 0 points  Months in reverse 0 points  Repeat phrase 0 points  Total Score 0 points    Immunizations Immunization History  Administered Date(s) Administered   Influenza,inj,Quad PF,6+ Mos 12/29/2013, 12/30/2014, 02/14/2016, 01/16/2017, 01/24/2018, 03/09/2019   PFIZER(Purple Top)SARS-COV-2 Vaccination 10/23/2019   Pneumococcal Conjugate-13 08/22/2020   Pneumococcal Polysaccharide-23 03/12/2009, 03/17/2021   Tdap 12/30/2014   Zoster Recombinat (Shingrix) 03/17/2021   Zoster, Live 02/22/2012    TDAP status: Up to date  Flu Vaccine status: Up to date  Pneumococcal vaccine status: Up to date  Covid-19 vaccine status: Completed vaccines  Qualifies for Shingles Vaccine? Yes   Zostavax completed Yes   Shingrix Completed?: No.  Education has been provided regarding the importance of this vaccine. Patient has been advised to call insurance company to determine out of pocket expense if they have not yet received this vaccine. Advised may also receive vaccine at local pharmacy or Health Dept. Verbalized acceptance and understanding.  Screening Tests Health Maintenance  Topic Date Due   FOOT EXAM  08/22/2021   COVID-19 Vaccine (2 - Pfizer series) 09/08/2021 (Originally 12/18/2019)   Zoster Vaccines- Shingrix (2 of 2) 09/08/2021 (Originally 05/12/2021)   INFLUENZA VACCINE  10/10/2021   HEMOGLOBIN A1C  12/01/2021   OPHTHALMOLOGY EXAM  03/14/2022   TETANUS/TDAP  12/29/2024   COLONOSCOPY (Pts 45-25yrs Insurance coverage will need to be confirmed)  02/27/2028   Pneumonia  Vaccine 67+ Years old  Completed   Hepatitis C Screening  Completed   HPV VACCINES  Aged Out    Health Maintenance  Health Maintenance Due  Topic Date Due   FOOT EXAM  08/22/2021    Colorectal cancer screening: Type of screening: Colonoscopy. Completed 02/26/2018. Repeat every 10 years  Lung Cancer Screening: (Low Dose CT Chest recommended if Age 26-80 years, 30 pack-year currently smoking OR have quit w/in 15years.) does qualify.   Lung Cancer Screening Referral: Last chest CT done 11/21/2018.  Additional Screening:  Hepatitis C Screening: does qualify; Completed 07/12/2016  Vision Screening: Recommended annual ophthalmology exams for early detection of glaucoma and other disorders of the eye. Is the patient up to date with their annual eye exam?  Yes  Who is the provider or what is the name of the office in which the patient attends annual eye exams? [facility] If pt is not established with a provider, would they like to be referred to a provider to establish care? No .   Dental Screening: Recommended annual dental exams for proper oral hygiene  Community Resource Referral / Chronic Care Management: CRR required this visit?  No   CCM required this visit?  No      Plan:     I have personally reviewed and noted the following in the patient's chart:   Medical and social history Use of alcohol, tobacco or illicit drugs  Current medications and supplements including opioid prescriptions. Patient is not currently taking opioid prescriptions. Functional ability and status Nutritional status Physical activity Advanced directives List of other physicians Hospitalizations, surgeries, and ER visits in previous 12 months Vitals Screenings to include cognitive, depression, and falls Referrals and appointments  In addition, I have reviewed and discussed with patient certain preventive protocols, quality metrics, and best practice recommendations. A written personalized  care plan for preventive services as well as general preventive health recommendations were provided to patient.     Chriss Driver, LPN   [id number]   Nurse Notes: Discussed Shingrix, second dose and how to obtain.

## 2021-08-23 NOTE — Patient Instructions (Signed)
Scott [name] , Thank you for taking time to come for your Medicare Wellness Visit. I appreciate your ongoing commitment to your health goals. Please review the following plan we discussed and let me know if I can assist you in the future.   Screening recommendations/referrals: Colonoscopy: Done 02/26/2018 Repeat in 10 years  Recommended yearly ophthalmology/optometry visit for glaucoma screening and checkup Recommended yearly dental visit for hygiene and checkup  Vaccinations: Influenza vaccine: Done 03/09/2019 Repeat annually  Pneumococcal vaccine: Done 08/22/2020, 03/17/2021, Tdap vaccine: Done 12/30/2014 Repeat in 10 years  Shingles vaccine: Done 03/17/2021 and 02/22/2012. Second dose of Shingrix due 08/31/2020.   Covid-19: Done 10/23/2019.  Advanced directives: Please bring a copy of your health care power of attorney and living will to the office to be added to your chart at your convenience.   Conditions/risks identified: KEEP UP THE GOOD WORK!!  Next appointment: Follow up in one year for your annual wellness visit. 2024.  Preventive Care 20 Years and Older, Male  Preventive care refers to lifestyle choices and visits with your health care provider that can promote health and wellness. What does preventive care include? A yearly physical exam. This is also called an annual well check. Dental exams once or twice a year. Routine eye exams. Ask your health care provider how often you should have your eyes checked. Personal lifestyle choices, including: Daily care of your teeth and gums. Regular physical activity. Eating a healthy diet. Avoiding tobacco and drug use. Limiting alcohol use. Practicing safe sex. Taking low doses of aspirin every day. Taking vitamin and mineral supplements as recommended by your health care provider. What happens during an annual well check? The services and screenings done by your health care provider during your annual well check will depend on  your age, overall health, lifestyle risk factors, and family history of disease. Counseling  Your health care provider may ask you questions about your: Alcohol use. Tobacco use. Drug use. Emotional well-being. Home and relationship well-being. Sexual activity. Eating habits. History of falls. Memory and ability to understand (cognition). Work and work Astronomer. Screening  You may have the following tests or measurements: Height, weight, and BMI. Blood pressure. Lipid and cholesterol levels. These may be checked every 5 years, or more frequently if you are over 56 years old. Skin check. Lung cancer screening. You may have this screening every year starting at age 64 if you have a 30-pack-year history of smoking and currently smoke or have quit within the past 15 years. Fecal occult blood test (FOBT) of the stool. You may have this test every year starting at age 10. Flexible sigmoidoscopy or colonoscopy. You may have a sigmoidoscopy every 5 years or a colonoscopy every 10 years starting at age 46. Prostate cancer screening. Recommendations will vary depending on your family history and other risks. Hepatitis C blood test. Hepatitis B blood test. Sexually transmitted disease (STD) testing. Diabetes screening. This is done by checking your blood sugar (glucose) after you have not eaten for a while (fasting). You may have this done every 1-3 years. Abdominal aortic aneurysm (AAA) screening. You may need this if you are a current or former smoker. Osteoporosis. You may be screened starting at age 56 if you are at high risk. Talk with your health care provider about your test results, treatment options, and if necessary, the need for more tests. Vaccines  Your health care provider may recommend certain vaccines, such as: Influenza vaccine. This is recommended every year. Tetanus, diphtheria,  and acellular pertussis (Tdap, Td) vaccine. You may need a Td booster every 10 years. Zoster  vaccine. You may need this after age 41. Pneumococcal 13-valent conjugate (PCV13) vaccine. One dose is recommended after age 53. Pneumococcal polysaccharide (PPSV23) vaccine. One dose is recommended after age 18. Talk to your health care provider about which screenings and vaccines you need and how often you need them. This information is not intended to replace advice given to you by your health care provider. Make sure you discuss any questions you have with your health care provider. Document Released: 03/25/2015 Document Revised: 11/16/2015 Document Reviewed: 12/28/2014 Elsevier Interactive Patient Education  2017 [place] Prevention in the Home Falls can cause injuries. They can happen to people of all ages. There are many things you can do to make your home safe and to help prevent falls. What can I do on the outside of my home? Regularly fix the edges of walkways and driveways and fix any cracks. Remove anything that might make you trip as you walk through a door, such as a raised step or threshold. Trim any bushes or trees on the path to your home. Use bright outdoor lighting. Clear any walking paths of anything that might make someone trip, such as rocks or tools. Regularly check to see if handrails are loose or broken. Make sure that both sides of any steps have handrails. Any raised decks and porches should have guardrails on the edges. Have any leaves, snow, or ice cleared regularly. Use sand or salt on walking paths during winter. Clean up any spills in your garage right away. This includes oil or grease spills. What can I do in the bathroom? Use night lights. Install grab bars by the toilet and in the tub and shower. Do not use towel bars as grab bars. Use non-skid mats or decals in the tub or shower. If you need to sit down in the shower, use a plastic, non-slip stool. Keep the floor dry. Clean up any water that spills on the floor as soon as it happens. Remove  soap buildup in the tub or shower regularly. Attach bath mats securely with double-sided non-slip rug tape. Do not have throw rugs and other things on the floor that can make you trip. What can I do in the bedroom? Use night lights. Make sure that you have a light by your bed that is easy to reach. Do not use any sheets or blankets that are too big for your bed. They should not hang down onto the floor. Have a firm chair that has side arms. You can use this for support while you get dressed. Do not have throw rugs and other things on the floor that can make you trip. What can I do in the kitchen? Clean up any spills right away. Avoid walking on wet floors. Keep items that you use a lot in easy-to-reach places. If you need to reach something above you, use a strong step stool that has a grab bar. Keep electrical cords out of the way. Do not use floor polish or wax that makes floors slippery. If you must use wax, use non-skid floor wax. Do not have throw rugs and other things on the floor that can make you trip. What can I do with my stairs? Do not leave any items on the stairs. Make sure that there are handrails on both sides of the stairs and use them. Fix handrails that are broken or loose. Make sure  that handrails are as long as the stairways. Check any carpeting to make sure that it is firmly attached to the stairs. Fix any carpet that is loose or worn. Avoid having throw rugs at the top or bottom of the stairs. If you do have throw rugs, attach them to the floor with carpet tape. Make sure that you have a light switch at the top of the stairs and the bottom of the stairs. If you do not have them, ask someone to add them for you. What else can I do to help prevent falls? Wear shoes that: Do not have high heels. Have rubber bottoms. Are comfortable and fit you well. Are closed at the toe. Do not wear sandals. If you use a stepladder: Make sure that it is fully opened. Do not climb a  closed stepladder. Make sure that both sides of the stepladder are locked into place. Ask someone to hold it for you, if possible. Clearly mark and make sure that you can see: Any grab bars or handrails. First and last steps. Where the edge of each step is. Use tools that help you move around (mobility aids) if they are needed. These include: Canes. Walkers. Scooters. Crutches. Turn on the lights when you go into a dark area. Replace any light bulbs as soon as they burn out. Set up your furniture so you have a clear path. Avoid moving your furniture around. If any of your floors are uneven, fix them. If there are any pets around you, be aware of where they are. Review your medicines with your doctor. Some medicines can make you feel dizzy. This can increase your chance of falling. Ask your doctor what other things that you can do to help prevent falls. This information is not intended to replace advice given to you by your health care provider. Make sure you discuss any questions you have with your health care provider. Document Released: 12/23/2008 Document Revised: 08/04/2015 Document Reviewed: 04/02/2014 Elsevier Interactive Patient Education  2017 [employer].

## 2021-08-31 ENCOUNTER — Ambulatory Visit (INDEPENDENT_AMBULATORY_CARE_PROVIDER_SITE_OTHER): Payer: Medicare Other | Admitting: Emergency Medicine

## 2021-08-31 DIAGNOSIS — Z23 Encounter for immunization: Secondary | ICD-10-CM

## 2021-09-04 ENCOUNTER — Encounter: Payer: Self-pay | Admitting: Family Medicine

## 2021-09-04 ENCOUNTER — Ambulatory Visit (INDEPENDENT_AMBULATORY_CARE_PROVIDER_SITE_OTHER): Payer: Medicare Other | Admitting: Family Medicine

## 2021-09-04 VITALS — BP 102/66 | HR 79 | Temp 98.7°F | Resp 20 | Ht 71.0 in | Wt 214.0 lb

## 2021-09-04 DIAGNOSIS — D696 Thrombocytopenia, unspecified: Secondary | ICD-10-CM

## 2021-09-04 DIAGNOSIS — E1159 Type 2 diabetes mellitus with other circulatory complications: Secondary | ICD-10-CM | POA: Diagnosis not present

## 2021-09-04 DIAGNOSIS — E785 Hyperlipidemia, unspecified: Secondary | ICD-10-CM | POA: Diagnosis not present

## 2021-09-04 DIAGNOSIS — I152 Hypertension secondary to endocrine disorders: Secondary | ICD-10-CM | POA: Diagnosis not present

## 2021-09-04 DIAGNOSIS — E1165 Type 2 diabetes mellitus with hyperglycemia: Secondary | ICD-10-CM | POA: Diagnosis not present

## 2021-09-04 DIAGNOSIS — E1169 Type 2 diabetes mellitus with other specified complication: Secondary | ICD-10-CM

## 2021-09-04 LAB — BAYER DCA HB A1C WAIVED: HB A1C (BAYER DCA - WAIVED): 7.3 % — ABNORMAL HIGH (ref 4.8–5.6)

## 2021-09-05 LAB — CMP14+EGFR
ALT: 16 IU/L (ref 0–44)
AST: 17 IU/L (ref 0–40)
Albumin/Globulin Ratio: 1.5 (ref 1.2–2.2)
Albumin: 4.3 g/dL (ref 3.8–4.8)
Alkaline Phosphatase: 56 IU/L (ref 44–121)
BUN/Creatinine Ratio: 23 (ref 10–24)
BUN: 20 mg/dL (ref 8–27)
Bilirubin Total: 0.8 mg/dL (ref 0.0–1.2)
CO2: 19 mmol/L — ABNORMAL LOW (ref 20–29)
Calcium: 9.2 mg/dL (ref 8.6–10.2)
Chloride: 98 mmol/L (ref 96–106)
Creatinine, Ser: 0.88 mg/dL (ref 0.76–1.27)
Globulin, Total: 2.8 g/dL (ref 1.5–4.5)
Glucose: 153 mg/dL — ABNORMAL HIGH (ref 70–99)
Potassium: 5 mmol/L (ref 3.5–5.2)
Sodium: 135 mmol/L (ref 134–144)
Total Protein: 7.1 g/dL (ref 6.0–8.5)
eGFR: 95 mL/min/{1.73_m2} (ref >59)

## 2021-09-05 LAB — CBC WITH DIFFERENTIAL/PLATELET
Basophils Absolute: 0.1 10*3/uL (ref 0.0–0.2)
Basos: 1 %
EOS (ABSOLUTE): 0.3 10*3/uL (ref 0.0–0.4)
Eos: 6 %
Hematocrit: 44.5 % (ref 37.5–51.0)
Hemoglobin: 15.3 g/dL (ref 13.0–17.7)
Immature Grans (Abs): 0 10*3/uL (ref 0.0–0.1)
Immature Granulocytes: 0 %
Lymphocytes Absolute: 1.6 10*3/uL (ref 0.7–3.1)
Lymphs: 31 %
MCH: 31 pg (ref 26.6–33.0)
MCHC: 34.4 g/dL (ref 31.5–35.7)
MCV: 90 fL (ref 79–97)
Monocytes Absolute: 0.4 10*3/uL (ref 0.1–0.9)
Monocytes: 9 %
Neutrophils Absolute: 2.7 10*3/uL (ref 1.4–7.0)
Neutrophils: 53 %
Platelets: 176 10*3/uL (ref 150–450)
RBC: 4.94 x10E6/uL (ref 4.14–5.80)
RDW: 12.7 % (ref 11.6–15.4)
WBC: 5.2 10*3/uL (ref 3.4–10.8)

## 2021-09-06 ENCOUNTER — Telehealth: Payer: Self-pay | Admitting: Pharmacist

## 2021-09-06 NOTE — Telephone Encounter (Signed)
Pt aware to come by

## 2021-09-06 NOTE — Telephone Encounter (Signed)
Please let patient know:  Rybelsus 14mg  daily (#4 boxes) is up front for pick up via [employer] patient assistance program

## 2021-09-14 ENCOUNTER — Ambulatory Visit (INDEPENDENT_AMBULATORY_CARE_PROVIDER_SITE_OTHER): Payer: Medicare Other | Admitting: Pharmacist

## 2021-09-14 DIAGNOSIS — E559 Vitamin D deficiency, unspecified: Secondary | ICD-10-CM

## 2021-09-14 DIAGNOSIS — J9801 Acute bronchospasm: Secondary | ICD-10-CM

## 2021-09-14 DIAGNOSIS — E1169 Type 2 diabetes mellitus with other specified complication: Secondary | ICD-10-CM

## 2021-09-14 DIAGNOSIS — I152 Hypertension secondary to endocrine disorders: Secondary | ICD-10-CM

## 2021-09-14 DIAGNOSIS — E118 Type 2 diabetes mellitus with unspecified complications: Secondary | ICD-10-CM

## 2021-09-14 MED ORDER — BUDESONIDE-FORMOTEROL FUMARATE 160-4.5 MCG/ACT IN AERO: 2.0000 | INHALATION_SPRAY | Freq: Two times a day (BID) | RESPIRATORY_TRACT | 12 refills | Status: DC

## 2021-09-14 MED ORDER — FREESTYLE LIBRE 2 SENSOR MISC: 11 refills | Status: DC

## 2021-09-14 NOTE — Patient Instructions (Addendum)
Visit Information  Following are the goals we discussed today:  Current Barriers:  Unable to independently afford treatment regimen Unable to maintain control of T2DM  Pharmacist Clinical Goal(s):  patient will verbalize ability to afford treatment regimen maintain control of T2DM  as evidenced by GOAL A1C  through collaboration with PharmD and provider.    Interventions: 1:1 collaboration with [name], DO regarding development and update of comprehensive plan of care as evidenced by provider attestation and co-signature Inter-disciplinary care team collaboration (see longitudinal plan of care) Comprehensive medication review performed; medication list updated in electronic medical record  Diabetes: Goal on Track (progressing): YES.  Uncontrolled-a1c 7.6-->7.3% (improved), GFR 92; current treatment: RYBELSUS 14mg  daily, FARXIGA 10mg  daily, METFORMIN max;  Patient making better food choices, increased water intake, increased walking since using CGM sample (libre 2) Will continue to work on ; interested in [place], but not covered due to not being on insulin and not having hypoglycemia Libre 2 CGM education provided Additional sample libre 2 provided Rx called in to drug store--$75 cash Consider transition from rybelsus to ozempic (more potent) if needed Denies personal and family history of Medullary thyroid cancer (MTC) Current glucose readings: fasting glucose: <150, post prandial glucose: n/a Discussed meal planning options and Plate method for healthy eating Avoid sugary drinks and desserts Incorporate balanced protein, non starchy veggies, 1 serving of carbohydrate with each meal Increase water intake Increase physical activity as able Current exercise: n/a, encouraged LDL AT GOAL 48--continue statin GFR STABLE Recommended potential transition to ozempic (would have to redo patient assistance) Assessed patient finances. [place]  symbicort, farxiga. Enrolled in [employer] for Rybelsus  Patient Goals/Self-Care Activities patient will:  - take medications as prescribed as evidenced by patient report and record review collaborate with provider on medication access solutions target a minimum of 150 minutes of moderate intensity exercise weekly engage in dietary modifications by FOLLOWING A HEART HEALTHY DIET/HEALTHY PLATE METHOD    Plan: Telephone follow up appointment with care management team member scheduled for:  01/2022  Signature Esen, PharmD, BCPS Clinical Pharmacist, [facility]  II Phone [phone]   Please call the care guide team at [phone] if you need to cancel or reschedule your appointment.   The patient verbalized understanding of instructions, educational materials, and care plan provided today and DECLINED offer to receive copy of patient instructions, educational materials, and care plan.

## 2021-09-14 NOTE — Progress Notes (Signed)
Chronic Care Management Pharmacy Note  09/14/2021 Name:  Scott [name] MRN:  [id number] DOB:  1955-06-06  Summary:  Diabetes: Goal on Track (progressing): YES.  Uncontrolled-a1c 7.6-->7.3% (improved), GFR 92; current treatment: RYBELSUS 34m daily, FARXIGA 156mdaily, METFORMIN max;  Patient making better food choices, increased water intake, increased walking since using CGM sample (libre 2) Will continue to work on diEducation officer, [place] due to not being on insulin and not having hypoglycemia Libre 2 CGM education provided Additional sample libre 2 provided Rx called in to drug store--$75 cash Consider transition from rybelsus to ozempic (more potent) if needed Denies personal and family history of Medullary thyroid cancer (MTC) Current glucose readings: fasting glucose: <150, post prandial glucose: n/a Discussed meal planning options and Plate method for healthy eating Avoid sugary drinks and desserts Incorporate balanced protein, non starchy veggies, 1 serving of carbohydrate with each meal Increase water intake Increase physical activity as able Current exercise: n/a, encouraged LDL AT GOAL 48--continue statin GFR STABLE Recommended potential transition to ozempic (would have to redo patient assistance) Assessed patient finances. [place] for symbicort, farxiga. Enrolled in novo nordisk for Rybelsus  Patient Goals/Self-Care Activities patient will:  - take medications as prescribed as evidenced by patient report and record review collaborate with provider on medication access solutions target a minimum of 150 minutes of moderate intensity exercise weekly engage in dietary modifications by FOLLOWING A HEART HEALTHY DIET/HEALTHY PLATE METHOD  Subjective: Scott [name] an [age].o. year old male who is a primary patient of Scott NorlanderDO.  The CCM team was consulted for assistance with disease management and  care coordination needs.    Engaged with patient face to face for follow up visit in response to provider referral for pharmacy case management and/or care coordination services.   Consent to Services:  The patient was given information about Chronic Care Management services, agreed to services, and gave verbal consent prior to initiation of services.  Please see initial visit note for detailed documentation.   Patient Care Team: Scott NorlanderDO as PCP - General (Family Medicine) MaClarene EssexMD as Consulting Physician (Gastroenterology) GrMagdalen SpatzNP as Nurse Practitioner (Pulmonary Disease) LeHarlen LabsMD as Referring Physician (Optometry) PrLavera GuiseRPEllsworth Municipal [name] Pharmacist (Family Medicine)  Objective:  Lab Results  Component Value Date   CREATININE 0.88 09/04/2021   CREATININE 0.92 05/31/2021   CREATININE 0.90 10/07/2020    Lab Results  Component Value Date   HGBA1C 7.3 (H) 09/04/2021   Last diabetic Eye exam:  Lab Results  Component Value Date/Time   HMDIABEYEEXA No Retinopathy 03/14/2021 12:00 AM    Last diabetic Foot exam: No results found for: "HMDIABFOOTEX"      Component Value Date/Time   CHOL 110 05/31/2021 1108   TRIG 100 05/31/2021 1108   TRIG 446 (H) 05/09/2015 0941   HDL 43 05/31/2021 1108   HDL 34 (L) 05/09/2015 0941   CHOLHDL 2.6 05/31/2021 1108   LDLCALC 48 05/31/2021 1108   LDLCALC 94 12/16/2013 1013       Latest Ref Rng & Units 09/04/2021   10:33 AM 05/31/2021   11:08 AM 08/18/2020   11:57 AM  Hepatic Function  Total Protein 6.0 - 8.5 g/dL 7.1  7.6  7.3   Albumin 3.8 - 4.8 g/dL 4.3  4.8  4.6   AST 0 - 40 IU/L 17  16  12  ALT 0 - 44 IU/L _0 Alk Phosphatase 44 - 121 IU/L 56  59  56   Total Bilirubin 0.0 - 1.2 mg/dL 0.8  1.0  0.7     No results found for: "TSH", "FREET4"     Latest Ref Rng & Units 09/04/2021   10:33 AM 08/18/2020   11:57 AM 10/27/2019   10:27 AM  CBC  WBC 3.4 - 10.8 x10E3/uL 5.2  5.0  5.3    Hemoglobin 13.0 - 17.7 g/dL 15.3  15.2  15.6   Hematocrit 37.5 - 51.0 % 44.5  45.1  46.2   Platelets 150 - 450 x10E3/uL 176  147  171     Lab Results  Component Value Date/Time   VD25OH 46.3 05/08/2018 11:48 AM   VD25OH 45.7 11/28/2017 03:15 PM    Clinical ASCVD: No  The ASCVD Risk score (Arnett DK, et al., 2019) failed to calculate for the following reasons:   The valid total cholesterol range is 130 to 320 mg/dL    Other: (CHADS2VASc if Afib, PHQ9 if depression, MMRC or CAT for COPD, ACT, DEXA)  Social History   Tobacco Use  Smoking Status Former   Packs/day: 0.00   Types: Cigarettes   Quit date: 01/17/2015   Years since quitting: 6.6  Smokeless Tobacco Never  Tobacco Comments   He has patches.   BP Readings from Last 3 Encounters:  09/04/21 102/66  05/31/21 119/64  04/13/21 112/72   Pulse Readings from Last 3 Encounters:  09/04/21 79  05/31/21 78  04/13/21 96   Wt Readings from Last 3 Encounters:  09/04/21 214 lb (97.1 kg)  08/23/21 214 lb (97.1 kg)  05/31/21 214 lb 9.6 oz (97.3 kg)    Assessment: Review of patient past medical history, allergies, medications, health status, including review of consultants reports, laboratory and other test data, was performed as part of comprehensive evaluation and provision of chronic care management services.   SDOH:  (Social Determinants of Health) assessments and interventions performed:    CCM Care Plan  Allergies  Allergen Reactions   Augmentin [Amoxicillin-Pot Clavulanate] Itching and Nausea And Vomiting   Codeine Itching and Nausea And Vomiting   Penicillins Hives and Itching    Has patient had a PCN reaction causing immediate rash, facial/tongue/throat swelling, SOB or lightheadedness with hypotension: No Has patient had a PCN reaction causing severe rash involving mucus membranes or skin necrosis: No Has patient had a PCN reaction that required hospitalization No Has patient had a PCN reaction occurring  within the last 10 years: No If all of the above answers are "NO", then may proceed with Cephalosporin use.    Atorvastatin     Myalgias and fatigue   Oxycodone Hcl Hives, Itching and Other (See Comments)    Medications Reviewed Today     Reviewed by Scott [name], [facility] (Pharmacist) on 09/14/21 at 1359  Med List Status: <None>   Medication Order Taking? Sig Documenting Provider Last Dose Status Informant  albuterol (VENTOLIN HFA) 108 (90 Base) MCG/ACT inhaler [id number] No Inhale 2 puffs into the lungs every 6 (six) hours as needed for wheezing. Ronnie Doss M, DO Taking Active   Ascorbic Acid (VITAMIN C) 1000 MG tablet [id number] No Take 1,000 mg by mouth daily. [provider] Taking Active Self  aspirin 81 MG tablet [id number] No Take 1 tablet (81 mg total) by mouth daily. Cherre Robins, RPH-CPP Taking Active Self  Med Note (COX, HEATHER C   Thu Nov 15, 2016  9:37 AM)    budesonide-formoterol [facility]) 160-4.5 MCG/ACT inhaler [id number]  Inhale 2 puffs into the lungs 2 (two) times daily. Janora Norlander, DO  Active            Med Note Blanca Friend, Sherian Maroon Sep 14, 2021  1:59 PM) Via [place]&me patient assistance program    Cholecalciferol (VITAMIN D) 50 MCG (2000 UT) CAPS [id number] No Take 2,000 Units by mouth daily. [provider] Taking Active Self  cycloSPORINE (RESTASIS) 0.05 % ophthalmic emulsion [id number] No Place 1 drop into both eyes 2 (two) times daily.  [provider] Taking Active Self  dapagliflozin propanediol (FARXIGA) 10 MG TABS tablet [id number] No Take 1 tablet (10 mg total) by mouth daily before breakfast. Janora Norlander, DO Taking Active            Med Note Blanca Friend, Sherian Maroon Sep 14, 2021  1:59 PM) Via [place] patient assistance program   diclofenac (VOLTAREN) 75 MG EC tablet [id number] No Take 1 tablet (75 mg total) by mouth 2 (two) times daily.  Patient not taking: Reported on 09/04/2021   Sharion Balloon, FNP Not  Taking Active Self  diclofenac sodium (VOLTAREN) 1 % GEL [id number] No Apply 4 g topically 4 (four) times daily. Janora Norlander, DO Taking Active Self  lisinopril (ZESTRIL) 5 MG tablet [id number] No TAKE ONE (1) TABLET EACH DAY [place], [place] M, DO Taking Active   loratadine (CLARITIN) 10 MG tablet [id number] No Take 10 mg by mouth daily as needed for allergies. [provider] Taking Active Self  metFORMIN (GLUCOPHAGE) 500 MG tablet [id number] No TAKE TWO TABLETS BY MOUTH TWICE DAILY Ronnie Doss M, DO Taking Active   naproxen sodium (ALEVE) 220 MG tablet [id number] No Take 440 mg by mouth 2 (two) times daily as needed (pain). [provider] Taking Active Self  omeprazole (PRILOSEC) 40 MG capsule [id number] No Take 40 mg by mouth daily. [provider] Taking Active   rosuvastatin (CRESTOR) 10 MG tablet [id number] No TAKE ONE (1) TABLET EACH DAY [place], [place] M, DO Taking Active   Semaglutide (RYBELSUS) 14 MG TABS [id number] No Take 1 tablet (14 mg total) by mouth daily. Janora Norlander, DO Taking Active            Med Note Blanca Friend, Ashani Pumphrey D   Thu Jun 29, 2021  9:00 AM) Via novo nordisk patient assistance program    vitamin E 180 MG (400 UNITS) capsule [id number] No Take 800 Units by mouth daily. [provider] Taking Active Self            Patient Active Problem List   Diagnosis Date Noted   Hypertension associated with diabetes ([place]) 12/02/2018   Hyperlipidemia associated with type 2 diabetes mellitus ([place]) 12/02/2018   Primary insomnia 06/14/2017   Essential hypertension, benign 06/14/2017   OSA (obstructive sleep apnea) 08/09/2016   Cholelithiasis with chronic cholecystitis 01/09/2016   Vitamin D deficiency 10/06/2015   Hyperlipidemia 02/12/2013   Back pain, acute 12/13/2012   Elevated blood pressure 12/04/2012   H/O asbestosis 12/04/2012   Erectile dysfunction 12/04/2012   Neuropathy of both upper extremities 12/04/2012    Degenerative arthritis of cervical spine 12/04/2012   BPH (benign prostatic hyperplasia) 12/04/2012   GERD (gastroesophageal reflux disease) 06/03/2012   Diabetes mellitus type 2, controlled ([place]) 06/03/2012    Immunization History  Administered Date(s) Administered  Influenza,inj,Quad PF,6+ Mos 12/29/2013, 12/30/2014, 02/14/2016, 01/16/2017, 01/24/2018, 03/09/2019   PFIZER(Purple Top)SARS-COV-2 Vaccination 10/23/2019   Pneumococcal Conjugate-13 08/22/2020   Pneumococcal Polysaccharide-23 03/12/2009, 03/17/2021   Tdap 12/30/2014   Zoster Recombinat (Shingrix) 03/17/2021, 08/31/2021   Zoster, Live 02/22/2012    Conditions to be addressed/monitored: HLD and DMII  Care Plan : PHARMD MEDICATION MANAGEMENT  Updates made by Scott [name], [place] since 09/14/2021 12:00 AM     Problem: DISEASE PROGRESSION PREVENTION      Long-Range Goal: T2DM   This Visit's Progress: Not on track  Note:   Current Barriers:  Unable to independently afford treatment regimen Unable to maintain control of T2DM  Pharmacist Clinical Goal(s):  patient will verbalize ability to afford treatment regimen maintain control of T2DM  as evidenced by GOAL A1C  through collaboration with PharmD and provider.    Interventions: 1:1 collaboration with Janora Norlander, DO regarding development and update of comprehensive plan of care as evidenced by provider attestation and co-signature Inter-disciplinary care team collaboration (see longitudinal plan of care) Comprehensive medication review performed; medication list updated in electronic medical record  Diabetes: Goal on Track (progressing): YES.  Uncontrolled-a1c 7.6-->7.3% (improved), GFR 92; current treatment: RYBELSUS 107m daily, FARXIGA 175mdaily, METFORMIN max;  Patient making better food choices, increased water intake, increased walking since using CGM sample (libre 2) Will continue to work on diEducation officer, environmentalinterested [place] due  to not being on insulin and not having hypoglycemia Libre 2 CGM education provided Additional sample libre 2 provided Rx called in to drug store--$75 cash Consider transition from rybelsus to ozempic (more potent) if needed Denies personal and family history of Medullary thyroid cancer (MTC) Current glucose readings: fasting glucose: <150, post prandial glucose: n/a Discussed meal planning options and Plate method for healthy eating Avoid sugary drinks and desserts Incorporate balanced protein, non starchy veggies, 1 serving of carbohydrate with each meal Increase water intake Increase physical activity as able Current exercise: n/a, encouraged LDL AT GOAL 48--continue statin GFR STABLE Recommended potential transition to ozempic (would have to redo patient assistance) Assessed patient finances. En[place] patient in [place]&me for symbicort, farxiga. Enrolled in novo nordisk for Rybelsus  Patient Goals/Self-Care Activities patient will:  - take medications as prescribed as evidenced by patient report and record review collaborate with provider on medication access solutions target a minimum of 150 minutes of moderate intensity exercise weekly engage in dietary modifications by   FOLLOWING A HEART HEALTHY DIET/HEALTHY PLATE METHOD       Medication Assistance:  symbicort & farxiga via AZ&me PAP; rybelsus via novo nordisk PAP   Follow Up:  Patient agrees to Care Plan and Follow-up.  Plan: Telephone follow up appointment with care management team member scheduled for:  01/2022   JuRegina EckPharmD, BCPS Clinical Pharmacist, [facility] Phone [phone]

## 2021-09-21 ENCOUNTER — Telehealth: Payer: Medicare Other

## 2021-09-26 ENCOUNTER — Other Ambulatory Visit: Payer: Self-pay | Admitting: Family Medicine

## 2021-09-26 DIAGNOSIS — E1169 Type 2 diabetes mellitus with other specified complication: Secondary | ICD-10-CM

## 2021-09-26 DIAGNOSIS — E1159 Type 2 diabetes mellitus with other circulatory complications: Secondary | ICD-10-CM

## 2021-10-09 DIAGNOSIS — E785 Hyperlipidemia, unspecified: Secondary | ICD-10-CM | POA: Diagnosis not present

## 2021-10-09 DIAGNOSIS — E118 Type 2 diabetes mellitus with unspecified complications: Secondary | ICD-10-CM | POA: Diagnosis not present

## 2021-10-09 DIAGNOSIS — E1159 Type 2 diabetes mellitus with other circulatory complications: Secondary | ICD-10-CM

## 2021-10-09 DIAGNOSIS — E1169 Type 2 diabetes mellitus with other specified complication: Secondary | ICD-10-CM | POA: Diagnosis not present

## 2021-10-09 DIAGNOSIS — I152 Hypertension secondary to endocrine disorders: Secondary | ICD-10-CM | POA: Diagnosis not present

## 2021-10-24 ENCOUNTER — Telehealth: Payer: Self-pay | Admitting: Family Medicine

## 2021-10-24 NOTE — Telephone Encounter (Signed)
Pt aware.

## 2021-10-24 NOTE — Telephone Encounter (Signed)
He will have to use voucher and call [phone] if asked to pay more than $75/month

## 2021-10-26 ENCOUNTER — Other Ambulatory Visit: Payer: Self-pay | Admitting: *Deleted

## 2021-10-26 NOTE — Patient Outreach (Signed)
  Care Coordination   10/26/2021  Name: Scott [name] MRN: [id number] DOB: 1955/08/21   Care Coordination Outreach Attempts:  An unsuccessful telephone outreach was attempted today to offer the patient information about available care coordination services as a benefit of their health plan. HIPAA compliant message left on voicemail, providing contact information for CSW, encouraging patient to return CSW's call at his earliest convenience.  Follow Up Plan:  Additional outreach attempts will be made to offer the patient care coordination information and services.   Encounter Outcome:  No Answer.   Care Coordination Interventions Activated:  No.    Care Coordination Interventions:  No, not indicated.    Danford Bad, BSW, MSW, LCSW  Licensed Restaurant manager, [employer] [facility]  Mailing [place] [address], [place], [place] [address] Physical Address[address] [place], [place] [address] Toll Free Main # [phone] Fax # [phone] Cell # [phone] [email or web address]

## 2021-11-02 ENCOUNTER — Other Ambulatory Visit: Payer: Self-pay | Admitting: *Deleted

## 2021-11-02 NOTE — Patient Outreach (Signed)
  Care Coordination   11/02/2021  Name: Devarius Nelles MRN: [id number] DOB: [birth year]   Care Coordination Outreach Attempts:  A second unsuccessful outreach was attempted today to offer the patient with information about available care coordination services as a benefit of their health plan.   HIPAA compliant messages left on voicemail, providing contact information for CSW, encouraging patient to return CSW's call at his earliest convenience.  Follow Up Plan:  Additional outreach attempts will be made to offer the patient care coordination information and services.   Encounter Outcome:  No Answer.   Care Coordination Interventions Activated:  No.    Care Coordination Interventions:  No, not indicated.    Danford Bad, BSW, MSW, LCSW  Licensed Restaurant manager, [employer] [facility]  Mailing [place] [address] [place], [place] [address] Physical Address[address], [place], [place] [address] Toll Free Main # [phone] Fax # [phone] Cell # [phone] [email or web address]

## 2021-11-07 ENCOUNTER — Telehealth: Payer: Self-pay | Admitting: *Deleted

## 2021-11-07 NOTE — Telephone Encounter (Signed)
Pt assistance  Meds here for pt up front- aware to pick up  #4 boxes of Rybelsus

## 2021-11-08 ENCOUNTER — Telehealth: Payer: Self-pay | Admitting: Pulmonary Disease

## 2021-11-08 DIAGNOSIS — G4733 Obstructive sleep apnea (adult) (pediatric): Secondary | ICD-10-CM | POA: Diagnosis not present

## 2021-11-08 DIAGNOSIS — R0681 Apnea, not elsewhere classified: Secondary | ICD-10-CM | POA: Diagnosis not present

## 2021-11-11 DIAGNOSIS — H10013 Acute follicular conjunctivitis, bilateral: Secondary | ICD-10-CM | POA: Diagnosis not present

## 2021-11-14 NOTE — Telephone Encounter (Signed)
Attempted to call. Left VM as well as a mychart message to schedule F/U

## 2021-11-21 ENCOUNTER — Other Ambulatory Visit: Payer: Self-pay | Admitting: *Deleted

## 2021-11-21 NOTE — Patient Outreach (Signed)
  Care Coordination   11/21/2021  Name: Shmiel Morton MRN: [id number] DOB: [birth year]   Care Coordination Outreach Attempts:  A third unsuccessful outreach was attempted today to offer the patient with information about available care coordination services as a benefit of their health plan. HIPAA compliant message left on voicemail, providing contact information for CSW, encouraging patient to return CSW's call at his earliest convenience.  Follow Up Plan:  No further outreach attempts will be made at this time. We have been unable to contact the patient to offer or enroll patient in care coordination services  Encounter Outcome:  No Answer.   Care Coordination Interventions Activated:  No.    Care Coordination Interventions:  No, not indicated.    Danford Bad, BSW, MSW, LCSW  Licensed Restaurant manager, fast food [facility]  Mailing [place] [address], [place], [place] [address] Physical Address[address] [place], [place] [address] Toll Free Main # [phone] Fax # [phone] Cell # [phone] [email or web address]

## 2021-12-06 ENCOUNTER — Ambulatory Visit: Payer: Medicare Other | Admitting: Family Medicine

## 2021-12-09 DIAGNOSIS — R0681 Apnea, not elsewhere classified: Secondary | ICD-10-CM | POA: Diagnosis not present

## 2021-12-09 DIAGNOSIS — G4733 Obstructive sleep apnea (adult) (pediatric): Secondary | ICD-10-CM | POA: Diagnosis not present

## 2021-12-29 ENCOUNTER — Telehealth: Payer: Self-pay

## 2021-12-29 NOTE — Telephone Encounter (Signed)
Received notification from [place]ME regarding RE-ENROLLMENT approval for [place] Patient assistance approved from 03/12/22 to 03/12/23.  Phone: [phone]

## 2022-01-08 DIAGNOSIS — G4733 Obstructive sleep apnea (adult) (pediatric): Secondary | ICD-10-CM | POA: Diagnosis not present

## 2022-01-08 DIAGNOSIS — R0681 Apnea, not elsewhere classified: Secondary | ICD-10-CM | POA: Diagnosis not present

## 2022-01-09 ENCOUNTER — Other Ambulatory Visit: Payer: Self-pay | Admitting: Family Medicine

## 2022-01-10 ENCOUNTER — Ambulatory Visit (INDEPENDENT_AMBULATORY_CARE_PROVIDER_SITE_OTHER): Payer: Medicare Other | Admitting: Family Medicine

## 2022-01-10 ENCOUNTER — Encounter: Payer: Self-pay | Admitting: Family Medicine

## 2022-01-10 VITALS — BP 124/71 | HR 89 | Temp 98.1°F | Ht 71.0 in | Wt 213.8 lb

## 2022-01-10 DIAGNOSIS — E559 Vitamin D deficiency, unspecified: Secondary | ICD-10-CM | POA: Diagnosis not present

## 2022-01-10 DIAGNOSIS — E1165 Type 2 diabetes mellitus with hyperglycemia: Secondary | ICD-10-CM

## 2022-01-10 DIAGNOSIS — E1159 Type 2 diabetes mellitus with other circulatory complications: Secondary | ICD-10-CM | POA: Diagnosis not present

## 2022-01-10 DIAGNOSIS — E118 Type 2 diabetes mellitus with unspecified complications: Secondary | ICD-10-CM | POA: Diagnosis not present

## 2022-01-10 DIAGNOSIS — E1169 Type 2 diabetes mellitus with other specified complication: Secondary | ICD-10-CM | POA: Diagnosis not present

## 2022-01-10 DIAGNOSIS — Z23 Encounter for immunization: Secondary | ICD-10-CM

## 2022-01-10 DIAGNOSIS — I152 Hypertension secondary to endocrine disorders: Secondary | ICD-10-CM | POA: Diagnosis not present

## 2022-01-10 DIAGNOSIS — E785 Hyperlipidemia, unspecified: Secondary | ICD-10-CM

## 2022-01-10 DIAGNOSIS — J029 Acute pharyngitis, unspecified: Secondary | ICD-10-CM

## 2022-01-10 NOTE — Progress Notes (Signed)
I have separately seen and examined the patient. I have discussed the findings and exam with student Dr Jerline Pain and agree with the below note.  My changes/additions are outlined in BLUE.    S: Patient reports that he has been having some slip ups with ice cream lately.  Every time he gives his grandchild ice cream he will have some too.  He has not seen any blood sugars above 200s however.  He was not able to continue the freestyle libre due to cost of med.  He is compliant with all medicines and has had no missed doses.  Does not report any chest pain, shortness of breath or blurred vision.  He does not want a sore throat that is been mild for the last couple of days.  No fevers, myalgia or other URI symptoms reported.  O: Vitals:   01/10/22 1108  BP: 124/71  Pulse: 89  Temp: 98.1 F (36.7 C)  SpO2: 95%    General appearance: alert, cooperative, appears stated age, no distress, and mildly obese Throat:  Oropharynx with mild erythema but no exudates or ulcerations appreciated.  Mucous membranes are moist Lungs: clear to auscultation bilaterally Heart: regular rate and rhythm, S1, S2 normal, no murmur, click, rub or gallop   A/P:  Uncontrolled type 2 diabetes mellitus with hyperglycemia (HCC) - Plan: Bayer DCA Hb A1c Waived, Microalbumin / creatinine urine ratio, AMB Referral to Chronic Care Management Services  Vitamin D deficiency - Plan: VITAMIN D 25 Hydroxy (Vit-D Deficiency, Fractures)  Hypertension associated with diabetes ([place]) - Plan: AMB Referral to Chronic Care Management Services  Hyperlipidemia associated with type 2 diabetes mellitus ([place]) - Plan: AMB Referral to Chronic Care Management Services  Sore throat - Plan: Rapid Strep Screen ([facility] ONLY)  Need for immunization against influenza - Plan: Flu Vaccine QUAD High Dose(Fluad)  Diabetes grossly uncontrolled with A1c rising above 8 now.  It sounds like this primarily has to do with dietary mismanagement and  he does identify areas of opportunity.  I had a frank discussion with him because his A1c has remained above 7 for almost a year now and I really do think that we should proceed with escalating therapies.  Discussed transition from Rybelsus 14 mg daily to Ozempic 0.5 mg weekly to start but anticipate he will need at least 1 mg weekly.  He will follow-up with CCM in the next 2 to 4 weeks to complete paperwork so that we can order the 1 mg for him.  He understands the side effects of the medication and reasons for reevaluation.  Blood pressure is controlled.  No changes needed.  Not yet due for fasting lipid.  Sore throat likely viral versus allergic in nature.  His rapid strep was negative and he really demonstrates no other signs or symptoms to suggest bacterial infection  Influenza vaccination administered  Renso Swett M. Lajuana Ripple, [facility]   -------------------------------------------------------------------------------------------------------------------------------------------------------------------------------------      Subjective: CC: 3 month diabetes follow-up PCP: Janora Norlander, DO [id number] Ranjel is a 66 y.o. male presenting to clinic today for:  Type 2 Diabetes with hypertension, hyperlipidemia:  Patient continues to be compliant with aspirin, rosuvastatin, Farxiga, lisinopril, Rybelsus, metformin. Patient assistance for medications approved through next year. Patient walks two days a week. Eats a well-balanced diet with lots of fruit and vegetables. Does report eating sweets. Reports easy bleeding, no muscle pain or weakness, abdominal pain, regurgitation of undigested food. Reports occasional nausea. Has had two  hypoglycemic episodes in the past year. Reports some numbness in the feet, no vision changes, no urinary symptoms. Lipid levels and creatinine levels from 09/04/2021 were within normal limits.   Glucometer: Yes   High at home: 154  after breakfast. 190 is the high; 110 is low, Taking medication(s): yes,.  Last eye exam: January 2023, showed no retinopathy Last foot exam: UTD   Last A1c:  Lab Results  Component Value Date   HGBA1C 7.3 (H) 09/04/2021   Nephropathy screen indicated?: No  Sore throat Patient reports sore throat for the past three days along with a bad taste in his mouth. Occasional cough and clear sinus drainage. No fever, body aches, headache, fatigue, loss of smell, sinus pain, GI symptoms, or sick contacts.   GERD Patient reports that GERD is well-controlled with omeprazole. No epigastric pain or food regurgitation. Had an upper endoscopy in the last year that showed no abnormalities.   Lung disease Patient takes albuterol approximately six times per year for occasional episodes of shortness of breath. Takes Symbicort approximately twice per week. Has obstructive sleep apnea and currently is compliant with this CPAP machine   MSK Patient reports ongoing pain in his neck that is well-controlled with Voltaren gel and nightly sugar-free CBD gummies. Reports that the CBD allows his to sleep with relief from the shoulder pain. Only taking intermittent Naproxen (about once monthly) due to history of GI bleeds.   Last flu, zoster and/or pneumovax:  Immunization History  Administered Date(s) Administered   Fluad Quad(high Dose 65+) 01/10/2022   Influenza,inj,Quad PF,6+ Mos 12/29/2013, 12/30/2014, 02/14/2016, 01/16/2017, 01/24/2018, 03/09/2019   PFIZER(Purple Top)SARS-COV-2 Vaccination 10/23/2019   Pneumococcal Conjugate-13 08/22/2020   Pneumococcal Polysaccharide-23 03/12/2009, 03/17/2021   Tdap 12/30/2014   Zoster Recombinat (Shingrix) 03/17/2021, 08/31/2021   Zoster, Live 02/22/2012    ROS: No dizziness, LOC, polyuria, polydipsia, unintended weight loss/gain, foot ulcerations, numbness or tingling in extremities, shortness of breath or chest pain.  Allergies  Allergen Reactions   Augmentin  [Amoxicillin-Pot Clavulanate] Itching and Nausea And Vomiting   Codeine Itching and Nausea And Vomiting   Penicillins Hives and Itching    Has patient had a PCN reaction causing immediate rash, facial/tongue/throat swelling, SOB or lightheadedness with hypotension: No Has patient had a PCN reaction causing severe rash involving mucus membranes or skin necrosis: No Has patient had a PCN reaction that required hospitalization No Has patient had a PCN reaction occurring within the last 10 years: No If all of the above answers are "NO", then may proceed with Cephalosporin use.    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[place])    Asthma    Diabetes mellitus without complication (HCC)    Dyslipidemia    GERD (gastroesophageal reflux disease)    Numbness    right side of tongue since 2016 surgery   Sleep apnea    uses cpap   Tobacco abuse     Current Outpatient Medications:    albuterol (VENTOLIN HFA) 108 (90 Base) MCG/ACT inhaler, Inhale 2 puffs into the lungs every 6 (six) hours as needed for wheezing., Disp: 1 each, Rfl: 3   Ascorbic Acid (VITAMIN C) 1000 MG tablet, Take 1,000 mg by mouth daily., Disp: , Rfl:    aspirin 81 MG tablet, Take 1 tablet (81 mg total)  by mouth daily., Disp: 30 tablet, Rfl:    budesonide-formoterol (SYMBICORT) 160-4.5 MCG/ACT inhaler, Inhale 2 puffs into the lungs 2 (two) times daily., Disp: 30.6 g, Rfl: 12   Cholecalciferol (VITAMIN D) 50 MCG (2000 UT) CAPS, Take 2,000 Units by mouth daily., Disp: , Rfl:    Continuous Blood Gluc Sensor (FREESTYLE LIBRE 2 SENSOR) MISC, Use to test blood sugar continuously.  Apply sensor to arm every 14 days.  DX: E11.65, Disp: 2 each, Rfl: 11   cycloSPORINE (RESTASIS) 0.05 % ophthalmic emulsion, Place 1 drop into both eyes 2 (two) times daily. , Disp: , Rfl:    dapagliflozin  propanediol (FARXIGA) 10 MG TABS tablet, Take 1 tablet (10 mg total) by mouth daily before breakfast., Disp: 90 tablet, Rfl: 3   diclofenac (VOLTAREN) 75 MG EC tablet, Take 1 tablet (75 mg total) by mouth 2 (two) times daily., Disp: 30 tablet, Rfl: 0   diclofenac sodium (VOLTAREN) 1 % GEL, Apply 4 g topically 4 (four) times daily., Disp: 350 g, Rfl: 1   lisinopril (ZESTRIL) 5 MG tablet, TAKE ONE (1) TABLET EACH DAY, Disp: 90 tablet, Rfl: 1   loratadine (CLARITIN) 10 MG tablet, Take 10 mg by mouth daily as needed for allergies., Disp: , Rfl:    metFORMIN (GLUCOPHAGE) 500 MG tablet, TAKE TWO TABLETS BY MOUTH TWICE DAILY, Disp: 360 tablet, Rfl: 0   naproxen sodium (ALEVE) 220 MG tablet, Take 440 mg by mouth 2 (two) times daily as needed (pain)., Disp: , Rfl:    omeprazole (PRILOSEC) 40 MG capsule, Take 40 mg by mouth daily., Disp: , Rfl:    rosuvastatin (CRESTOR) 10 MG tablet, TAKE ONE (1) TABLET EACH DAY, Disp: 90 tablet, Rfl: 1   Semaglutide (RYBELSUS) 14 MG TABS, Take 1 tablet (14 mg total) by mouth daily., Disp: 30 tablet, Rfl: 0   vitamin E 180 MG (400 UNITS) capsule, Take 800 Units by mouth daily., Disp: , Rfl:  Social History   Socioeconomic History   Marital status: Married    Spouse name: Not on file   Number of children: 2   Years of education: Not on file   Highest education level: Not on file  Occupational History   Occupation: Plumber    Employer: SELF EMPLOYED  Tobacco Use   Smoking status: Former    Packs/day: 0.00    Types: Cigarettes    Quit date: 01/17/2015    Years since quitting: 6.9   Smokeless tobacco: Never   Tobacco comments:    He has patches.  Vaping Use   Vaping Use: Never used  Substance and Sexual Activity   Alcohol use: Not Currently    Comment: social   Drug use: No   Sexual activity: Not on file  Other Topics Concern   Not on file  Social History Narrative   Lives at home with wife.    Social Determinants of Health   Financial Resource Strain:  Low Risk  (08/23/2021)   Overall Financial Resource Strain (CARDIA)    Difficulty of Paying Living Expenses: Not hard at all  Food Insecurity: No Food Insecurity (08/23/2021)   Hunger Vital Sign    Worried About Running Out of Food in the Last Year: Never true    Ran Out of Food in the Last Year: Never true  Transportation Needs: No Transportation Needs (08/23/2021)   PRAPARE - Hydrologist (Medical): No    Lack of Transportation (Non-Medical): No  Physical Activity: Sufficiently  Active (08/23/2021)   Exercise Vital Sign    Days of Exercise per Week: 5 days    Minutes of Exercise per Session: 50 min  Stress: No Stress Concern Present (08/23/2021)   [employer] of Occupational Health - Occupational Stress Questionnaire    Feeling of Stress : Only a little  Social Connections: Moderately Integrated (08/23/2021)   Social Connection and Isolation Panel [NHANES]    Frequency of Communication with Friends and Family: More than three times a week    Frequency of Social Gatherings with Friends and Family: Once a week    Attends Religious Services: 1 to 4 times per year    Active Member of [employer] or Organizations: No    Attends Banker Meetings: Never    Marital Status: Married  Catering manager Violence: Not At Risk (08/23/2021)   Humiliation, Afraid, Rape, and Kick questionnaire    Fear of Current or Ex-Partner: No    Emotionally Abused: No    Physically Abused: No    Sexually Abused: No   Family History  Problem Relation Age of Onset   Stroke Father        Died age 25   CAD Father    CAD Brother 76       CABG    Objective: Office vital signs reviewed. BP 124/71   Pulse 89   Temp 98.1 F (36.7 C)   Ht 5\' 11"  (1.803 m)   Wt 213 lb 12.8 oz (97 kg)   SpO2 95%   BMI 29.82 kg/m   Physical Examination:  General: Awake, alert, well nourished, No acute distress HEENT: Normal    Neck: No masses palpated. No lymphadenopathy    Ears:  Tympanic membranes intact, normal light reflex, no erythema, no bulging    Nose: nasal turbinates moist, clear nasal discharge    Throat: moist mucus membranes, mild erythema, no tonsillar exudate.  Airway is patent Cardio: regular rate and rhythm, S1S2 heard, no murmurs appreciated Pulm: clear to auscultation bilaterally, no wheezes, rhonchi or rales; normal work of breathing on room air GI: soft, non-tender, non-distended, bowel sounds present x4, no hepatomegaly, no splenomegaly, no masses Extremities: warm, well perfused, No edema, cyanosis or clubbing; +2 pulses bilaterally Skin: dry; intact; no rashes. Scattered bruising on upper extremities.   Assessment/ Plan: 66 y.o. male   The patient reports adherence to their prescribed medications. However, the A1c level measured during today's clinic visit was 8.1, indicating a concerning level of uncontrolled diabetes. Given the patient's age and existing cardiovascular comorbidities, I recommend setting a target A1c of approximately 6.5. To achieve this, we plan to initiate injection therapy as a replacement for Rybelsus. I provided the patient with samples of Ozempic and educated him on its proper administration. I also counseled the patient on the long-term effects of elevated blood sugar on the body. Will check microalbumin/creatinine urine ratio to monitor kidney function.  Regarding the patient's sore throat, it does not appear to be concerning for strep throat as indicated by a negative swab test and a low Centor Criteria score. The absence of sick contacts, fever, cough, body aches, headache, and loss of the sense of smell suggests a viral or non-streptococcal etiology. I counseled the patient on conservative measures for symptom management.  Orders Placed This Encounter  Procedures   Rapid Strep Screen ([facility] ONLY)   Flu Vaccine QUAD High Dose(Fluad)   Bayer DCA Hb A1c Waived   Microalbumin / creatinine urine ratio  VITAMIN D  25 Hydroxy (Vit-D Deficiency, Fractures)   No orders of the defined types were placed in this encounter.  Stephani Police, MS3

## 2022-01-11 LAB — CULTURE, GROUP A STREP

## 2022-01-11 LAB — VITAMIN D 25 HYDROXY (VIT D DEFICIENCY, FRACTURES): Vit D, 25-Hydroxy: 62.8 ng/mL (ref 30.0–100.0)

## 2022-01-11 LAB — MICROALBUMIN / CREATININE URINE RATIO
Creatinine, Urine: 50 mg/dL
Microalb/Creat Ratio: 12 mg/g creat (ref 0–29)
Microalbumin, Urine: 5.8 ug/mL

## 2022-01-11 LAB — BAYER DCA HB A1C WAIVED: HB A1C (BAYER DCA - WAIVED): 8.1 % — ABNORMAL HIGH (ref 4.8–5.6)

## 2022-01-11 LAB — RAPID STREP SCREEN (MED CTR MEBANE ONLY): Strep Gp A Ag, IA W/Reflex: NEGATIVE

## 2022-01-12 ENCOUNTER — Telehealth: Payer: Self-pay | Admitting: Family Medicine

## 2022-01-12 ENCOUNTER — Other Ambulatory Visit: Payer: Self-pay | Admitting: Family Medicine

## 2022-01-12 MED ORDER — CEFDINIR 300 MG PO CAPS: 300.0000 mg | ORAL_CAPSULE | Freq: Two times a day (BID) | ORAL | 0 refills | Status: DC

## 2022-01-12 NOTE — Telephone Encounter (Signed)
Pt called to let Dr Darnell Level know that since his visit with her the other day, his throat has gotten a lot worse and says its up in both ears now too. Says his throat is raw sore. Was told to call office and let Dr Darnell Level know if symptoms got worse and she would send medicine in for him.  Pt uses the drug store in [place].

## 2022-01-12 NOTE — Telephone Encounter (Signed)
Omnicef sent. Has tolerated cephalosporin class without problems in past.

## 2022-01-12 NOTE — Telephone Encounter (Signed)
Pt aware.

## 2022-01-18 NOTE — Telephone Encounter (Signed)
Disregard previous note - patient ELIGIBLE for 2024 enrollment. 

## 2022-01-24 ENCOUNTER — Telehealth: Payer: Self-pay | Admitting: Pharmacist

## 2022-01-24 ENCOUNTER — Ambulatory Visit (INDEPENDENT_AMBULATORY_CARE_PROVIDER_SITE_OTHER): Payer: Medicare Other | Admitting: Pharmacist

## 2022-01-24 DIAGNOSIS — E1165 Type 2 diabetes mellitus with hyperglycemia: Secondary | ICD-10-CM

## 2022-01-24 DIAGNOSIS — E1169 Type 2 diabetes mellitus with other specified complication: Secondary | ICD-10-CM

## 2022-01-24 DIAGNOSIS — I10 Essential (primary) hypertension: Secondary | ICD-10-CM

## 2022-01-24 MED ORDER — DAPAGLIFLOZIN PROPANEDIOL 10 MG PO TABS: 10.0000 mg | ORAL_TABLET | Freq: Every day | ORAL | 4 refills | Status: DC

## 2022-01-24 MED ORDER — SEMAGLUTIDE(0.25 OR 0.5MG/DOS) 2 MG/3ML ~~LOC~~ SOPN: PEN_INJECTOR | SUBCUTANEOUS | 5 refills | Status: DC

## 2022-01-24 NOTE — Telephone Encounter (Signed)
Please assist in re-enrolling patient for 2024 Az&me -- farxiga 10mg  daily in the AM (please mail pt portion and send PCP portion to ) Novo nordisk -- Ozempic 1mg  weekly (I already e submitted new app online today)  Thanks!!

## 2022-01-24 NOTE — Patient Instructions (Addendum)
Visit Information  Following are the goals we discussed today:  Current Barriers:  Unable to independently afford treatment regimen Unable to maintain control of T2DM, HTN  Pharmacist Clinical Goal(s):  patient will verbalize ability to afford treatment regimen maintain control of T2DM  as evidenced by GOAL A1C  through collaboration with PharmD and provider.    Interventions: 1:1 collaboration with [name], DO regarding development and update of comprehensive plan of care as evidenced by provider attestation and co-signature Inter-disciplinary care team collaboration (see longitudinal plan of care) Comprehensive medication review performed; medication list updated in electronic medical record  Diabetes: Goal on track: NO.  Uncontrolled-A1c increased to 8.1%7.6-->7.3% (improved), GFR 92-->95 Current treatment: RYBELSUS 14mg  daily-->Ozempic 0.5mg  sq weekly, FARXIGA 10mg  daily, METFORMIN max;  Patient transitioned to Ozempic 0.5mg  from Rybelsus 14mg  daily -- additional glycemic control is needed Instructed patient to continue Ozemipc 0.5mg  weekly x4 weeks then we will increase to 1mg  sq weekly (when patient assistance supply is here) Dose change forms submitted to novo nordisk PAP for Ozempic 1mg  weekly Patient continues to make healthier food choices, increased water intake, increased walking  Will continue to work on diet/lifestyle; interested in [place], but not covered under insurance (not on insulin/no hypoglycemia) Denies personal and family history of Medullary thyroid cancer (MTC) Current glucose readings: fasting glucose: <150, post prandial glucose: >180- Discussed meal planning options and Plate method for healthy eating Avoid sugary drinks and desserts Incorporate balanced protein, non starchy veggies, 1 serving of carbohydrate with each meal Increase water intake Increase physical activity as able Current exercise: n/a, encouraged LDL AT GOAL--continue  statin GFR STABLE Recommended ozempic titration Assessed patient finances. Will re-enroll for  az&me for  farxiga. Dose change submitted to  novo nordisk for Ozemipc  Patient Goals/Self-Care Activities patient will:  - take medications as prescribed as evidenced by patient report and record review collaborate with provider on medication access solutions target a minimum of 150 minutes of moderate intensity exercise weekly engage in dietary modifications by FOLLOWING A HEART HEALTHY DIET/HEALTHY PLATE METHOD    Plan: Telephone follow up appointment with care management team member scheduled for:  3 month  Signature , PharmD, BCPS Clinical Pharmacist, [facility]  II Phone [phone]   Please call the care guide team at [phone] if you need to cancel or reschedule your appointment.   The patient verbalized understanding of instructions, educational materials, and care plan provided today and DECLINED offer to receive copy of patient instructions, educational materials, and care plan.

## 2022-01-24 NOTE — Progress Notes (Signed)
Chronic Care Management Pharmacy Note  01/24/2022 Name:  Scott [name] MRN:  [id number] DOB:  July 08, 1955  Summary:  Diabetes: Goal on track: NO.  Uncontrolled-A1c increased to 8.1%, GFR 92-->95 Current treatment: RYBELSUS 14mg  daily-->Ozempic 0.5mg  sq weekly, FARXIGA 10mg  daily, METFORMIN max;  Patient transitioned to Ozempic 0.5mg  from Rybelsus 14mg  daily -- additional glycemic control is needed Instructed patient to continue Ozemipc 0.5mg  weekly x4 weeks then we will increase to 1mg  sq weekly (when patient assistance supply is here) Dose change forms submitted to [employer] PAP for Ozempic 1mg  weekly Patient continues to make healthier food choices, increased water intake, increased walking  Will continue to work on diet/lifestyle; interested in [place], but not covered under insurance (not on insulin/no hypoglycemia) Denies personal and family history of Medullary thyroid cancer (MTC) Current glucose readings: fasting glucose: <150, post prandial glucose: >180- Discussed meal planning options and Plate method for healthy eating Avoid sugary drinks and desserts Incorporate balanced protein, non starchy veggies, 1 serving of carbohydrate with each meal Increase water intake Increase physical activity as able Current exercise: n/a, encouraged LDL AT GOAL--continue statin GFR STABLE Recommended ozempic titration Assessed patient finances. Will re-enroll for  az&me for  farxiga. Dose change submitted to  [employer] [facility] for Ozemipc  Patient Goals/Self-Care Activities patient will:  - take medications as prescribed as evidenced by patient report and record review collaborate with provider on medication access solutions target a minimum of 150 minutes of moderate intensity exercise weekly engage in dietary modifications by FOLLOWING A HEART HEALTHY DIET/HEALTHY PLATE METHOD    Subjective: Scott [name] is an 66 y.o. year old male who is a primary patient of , DO.  The patient was referred to the Chronic Care Management team for assistance with care management needs subsequent to provider initiation of CCM services and plan of care.    Engaged with patient by telephone for follow up visit in response to provider referral for CCM services.   Objective:  LABS:    Lab Results  Component Value Date   CREATININE 0.88 09/04/2021   CREATININE 0.92 05/31/2021   CREATININE 0.90 10/07/2020     Lab Results  Component Value Date   HGBA1C 8.1 (H) 01/10/2022         Component Value Date/Time   CHOL 110 05/31/2021 1108   TRIG 100 05/31/2021 1108   TRIG 446 (H) 05/09/2015 0941   HDL 43 05/31/2021 1108   HDL 34 (L) 05/09/2015 0941   CHOLHDL 2.6 05/31/2021 1108   LDLCALC 48 05/31/2021 1108   LDLCALC 94 12/16/2013 1013     Clinical ASCVD:  NO   The ASCVD Risk score (Arnett DK, et al., 2019) failed to calculate for the following reasons:   The valid total cholesterol range is 130 to 320 mg/dL    Other: (05/11/2015 if Afib, PHQ9 if depression, MMRC or CAT for COPD, ACT, DEXA)    BP Readings from Last 3 Encounters:  01/10/22 124/71  09/04/21 102/66  05/31/21 119/64      SDOH:  (Social Determinants of Health) assessments and interventions performed:    Allergies  Allergen Reactions   Augmentin [Amoxicillin-Pot Clavulanate] Itching and Nausea And Vomiting   Codeine Itching and Nausea And Vomiting   Penicillins Hives and Itching    Has patient had a PCN reaction causing immediate rash, facial/tongue/throat swelling, SOB or lightheadedness with hypotension: No Has patient had a PCN reaction causing severe rash involving mucus membranes or skin necrosis: No  Has patient had a PCN reaction that required hospitalization No Has patient had a PCN reaction occurring within the last 10 years: No If all of the above answers are "NO", then may proceed with Cephalosporin use.    Atorvastatin     Myalgias and fatigue   Oxycodone Hcl Hives, Itching  and Other (See Comments)    Medications Reviewed Today     Reviewed by Danella Maiers, [facility] (Pharmacist) on 01/24/22 at 1218  Med List Status: <None>   Medication Order Taking? Sig Documenting Provider Last Dose Status Informant  albuterol (VENTOLIN HFA) 108 (90 Base) MCG/ACT inhaler [id number] No Inhale 2 puffs into the lungs every 6 (six) hours as needed for wheezing. Delynn Flavin M, DO Taking Active   Ascorbic Acid (VITAMIN C) 1000 MG tablet [id number] No Take 1,000 mg by mouth daily. [provider] Taking Active Self  aspirin 81 MG tablet [id number] No Take 1 tablet (81 mg total) by mouth daily. Henrene Pastor, RPH-CPP Taking Active Self           Med Note (COX, HEATHER C   Thu Nov 15, 2016  9:37 AM)    budesonide-formoterol [facility]) 160-4.5 MCG/ACT inhaler [id number] No Inhale 2 puffs into the lungs 2 (two) times daily. [name], DO Taking Active            Med Note Donn Pierini Sep 14, 2021  1:59 PM) Via AZ&me patient assistance program      Discontinued 01/24/22 1215 (Completed Course)   Cholecalciferol (VITAMIN D) 50 MCG (2000 UT) CAPS [id number] No Take 2,000 Units by mouth daily. [provider] Taking Active Self  cycloSPORINE (RESTASIS) 0.05 % ophthalmic emulsion [id number] No Place 1 drop into both eyes 2 (two) times daily.  [provider] Taking Active Self  dapagliflozin propanediol (FARXIGA) 10 MG TABS tablet [id number]  Take 1 tablet (10 mg total) by mouth daily before breakfast. [name], DO  Active            Med Note Cresenciano Genre, Lilla Shook   Wed Jan 24, 2022 12:15 PM) Via AZ&me patient assistance program   diclofenac (VOLTAREN) 75 MG EC tablet [id number] No Take 1 tablet (75 mg total) by mouth 2 (two) times daily. Junie Spencer, FNP Taking Active Self  diclofenac sodium (VOLTAREN) 1 % GEL [id number] No Apply 4 g topically 4 (four) times daily. [name], DO Taking Active Self  lisinopril (ZESTRIL) 5 MG tablet  [id number] No TAKE ONE (1) TABLET EACH DAY Gottschalk, [place] M, DO Taking Active   loratadine (CLARITIN) 10 MG tablet [id number] No Take 10 mg by mouth daily as needed for allergies. [provider] Taking Active Self  metFORMIN (GLUCOPHAGE) 500 MG tablet [id number] No TAKE TWO TABLETS BY MOUTH TWICE DAILY Delynn Flavin M, DO Taking Active   naproxen sodium (ALEVE) 220 MG tablet [id number] No Take 440 mg by mouth 2 (two) times daily as needed (pain). [provider] Taking Active Self  omeprazole (PRILOSEC) 40 MG capsule [id number] No Take 40 mg by mouth daily. [provider] Taking Active   rosuvastatin (CRESTOR) 10 MG tablet [id number] No TAKE ONE (1) TABLET EACH DAY Jefferson, Ruby, DO Taking Active   Semaglutide,0.25 or 0.5MG /DOS, 2 MG/3ML Namon Cirri [id number] Yes Inject 0.5mg  into the skin weekly for 4 weeks, then increase to 1mg  into skin weekly (via patient assistance program) [employer], DO  Active  Med Note Cresenciano Genre, Akylah Hascall D   Wed Jan 24, 2022 12:18 PM) Via [employer] patient assistance program    vitamin E 180 MG (400 UNITS) capsule [id number] No Take 800 Units by mouth daily. [provider] Taking Active Self              Goals Addressed               This Visit's Progress     Patient Stated     T2DM, HLD PHARMD GOAL (pt-stated)        =Current Barriers:  Unable to independently afford treatment regimen Unable to maintain control of T2DM, HTN  Pharmacist Clinical Goal(s):  patient will verbalize ability to afford treatment regimen maintain control of T2DM  as evidenced by GOAL A1C  through collaboration with PharmD and provider.    Interventions: 1:1 collaboration with [name], DO regarding development and update of comprehensive plan of care as evidenced by provider attestation and co-signature Inter-disciplinary care team collaboration (see longitudinal plan of care) Comprehensive  medication review performed; medication list updated in electronic medical record  Diabetes: Goal on track: NO.  Uncontrolled-A1c increased to 8.1%7.6-->7.3% (improved), GFR 92-->95 Current treatment: RYBELSUS 14mg  daily-->Ozempic 0.5mg  sq weekly, FARXIGA 10mg  daily, METFORMIN max;  Patient transitioned to Ozempic 0.5mg  from Rybelsus 14mg  daily -- additional glycemic control is needed Instructed patient to continue Ozemipc 0.5mg  weekly x4 weeks then we will increase to 1mg  sq weekly (when patient assistance supply is here) Dose change forms submitted to [employer] PAP for Ozempic 1mg  weekly Patient continues to make healthier food choices, increased water intake, increased walking  Will continue to work on diet/lifestyle; interested in [place], but not covered under insurance (not on insulin/no hypoglycemia) Denies personal and family history of Medullary thyroid cancer (MTC) Current glucose readings: fasting glucose: <150, post prandial glucose: >180- Discussed meal planning options and Plate method for healthy eating Avoid sugary drinks and desserts Incorporate balanced protein, non starchy veggies, 1 serving of carbohydrate with each meal Increase water intake Increase physical activity as able Current exercise: n/a, encouraged LDL AT GOAL--continue statin GFR STABLE Recommended ozempic titration Assessed patient finances. Will re-enroll for  az&me for  farxiga. Dose change submitted to  [employer] [facility] for Ozemipc  Patient Goals/Self-Care Activities patient will:  - take medications as prescribed as evidenced by patient report and record review collaborate with provider on medication access solutions target a minimum of 150 minutes of moderate intensity exercise weekly engage in dietary modifications by FOLLOWING A HEART HEALTHY DIET/HEALTHY PLATE METHOD          Plan: Telephone follow up appointment with care management team member scheduled for:  6 MONTHS      , PharmD, BCPS Clinical Pharmacist, [facility]  II Phone [phone]

## 2022-01-29 NOTE — Telephone Encounter (Signed)
Mailed app to pt's home. 

## 2022-02-08 DIAGNOSIS — E785 Hyperlipidemia, unspecified: Secondary | ICD-10-CM

## 2022-02-08 DIAGNOSIS — Z7984 Long term (current) use of oral hypoglycemic drugs: Secondary | ICD-10-CM | POA: Diagnosis not present

## 2022-02-08 DIAGNOSIS — E1169 Type 2 diabetes mellitus with other specified complication: Secondary | ICD-10-CM | POA: Diagnosis not present

## 2022-02-08 DIAGNOSIS — G4733 Obstructive sleep apnea (adult) (pediatric): Secondary | ICD-10-CM | POA: Diagnosis not present

## 2022-02-08 DIAGNOSIS — R0681 Apnea, not elsewhere classified: Secondary | ICD-10-CM | POA: Diagnosis not present

## 2022-02-16 ENCOUNTER — Ambulatory Visit (INDEPENDENT_AMBULATORY_CARE_PROVIDER_SITE_OTHER): Payer: Medicare Other | Admitting: *Deleted

## 2022-02-16 DIAGNOSIS — I10 Essential (primary) hypertension: Secondary | ICD-10-CM

## 2022-02-16 DIAGNOSIS — E1165 Type 2 diabetes mellitus with hyperglycemia: Secondary | ICD-10-CM

## 2022-02-16 NOTE — Chronic Care Management (AMB) (Signed)
Chronic Care Management   CCM RN Visit Note  02/16/2022 Name: Scott [name] MRN: [id number] DOB: 1955-09-30  Subjective: Scott [name] is a 66 y.o. year old male who is a primary care patient of [name], DO. The patient was referred to the Chronic Care Management team for assistance with care management needs subsequent to provider initiation of CCM services and plan of care.    Today's Visit:  Engaged with patient by telephone for follow up visit.     SDOH Interventions Today    Flowsheet Row Most Recent Value  SDOH Interventions   Food Insecurity Interventions Intervention Not Indicated  Housing Interventions Intervention Not Indicated  Transportation Interventions Intervention Not Indicated  Utilities Interventions Intervention Not Indicated  Financial Strain Interventions Intervention Not Indicated  Physical Activity Interventions Intervention Not Indicated  Stress Interventions Intervention Not Indicated  Social Connections Interventions Intervention Not Indicated         Goals Addressed             This Visit's Progress    CCM (DIABETES) EXPECTED OUTCOME:  MONITOR, SELF-MANAGE AND REDUCE SYMPTOMS OF DIABETES       Current Barriers:  Knowledge Deficits related to Diabetes management Chronic Disease Management support and education needs related to Diabetes, diet No Advanced Directives in place- documents mailed Patient reports he checks CBG 1-2 x per week with fasting ranges 100-170 with today's reading 171, random ranges near 170. Patient states he used to have [employer] and did well with this but due to not being on insulin the costs was too high, states trying to eat healthy and be mindful of carbohydrate intake, pt is currently working with [facility] pharmacist, states he is almost out of ozempic and has last dose next week, would like pharmacist to be aware. Patient reports he walks 2 x week, recently retired from work. Patient has shoulder pain  but states it is under better control.  Planned Interventions: Provided education to patient about basic DM disease process; Reviewed medications with patient and discussed importance of medication adherence;        Reviewed prescribed diet with patient carbohydrate modified, plate method; Counseled on importance of regular laboratory monitoring as prescribed;        Discussed plans with patient for ongoing care management follow up and provided patient with direct contact information for care management team;      Provided patient with written educational materials related to hypo and hyperglycemia and importance of correct treatment;       Advised patient, providing education and rationale, to check cbg when you have symptoms of low or high blood sugar and per doctor's order/ recommendation  and record        call provider for findings outside established parameters;       Screening for signs and symptoms of depression related to chronic disease state;        Assessed social determinant of health barriers;        Pain assessment completed In basket sent to pharmacist Vanice Sarah reporting pt is almost out of ozempic and needs samples/ assistance  Symptom Management: Take medications as prescribed   Attend all scheduled provider appointments Call pharmacy for medication refills 3-7 days in advance of running out of medications Attend church or other social activities Perform all self care activities independently  Perform IADL's (shopping, preparing meals, housekeeping, managing finances) independently Call provider office for new concerns or questions  check blood sugar at prescribed times: when  you have symptoms of low or high blood sugar and per doctor's order/ recommendation  check feet daily for cuts, sores or redness enter blood sugar readings and medication or insulin into daily log take the blood sugar log to all doctor visits take the blood sugar meter to all doctor  visits trim toenails straight across fill half of plate with vegetables limit fast food meals to no more than 1 per week manage portion size read food labels for fat, fiber, carbohydrates and portion size Look over education sent via my chart- hypoglycemia Keep up your walking routine Pharmacist was notified you are almost out of ozempic  Follow Up Plan: Telephone follow up appointment with care management team member scheduled for: 04/13/21 at 9 am        CCM (HYPERTENSION) EXPECTED OUTCOME: MONITOR, SELF-MANAGE AND REDUCE SYMPTOMS OF HYPERTENSION       Current Barriers:  Knowledge Deficits related to Hypertension management Chronic Disease Management support and education needs related to Hypertension, diet No Advanced Directives in place- pt requests documents be mailed Patient reports he lives with spouse, is independent with all aspects of his care, continues to drive, recently retired from plumbing. Patient reports he has blood pressure cuff but does not check at home, BP is checked at provider appointments and [employer].  Planned Interventions: Evaluation of current treatment plan related to hypertension self management and patient's adherence to plan as established by provider;   Reviewed prescribed diet low sodium Reviewed medications with patient and discussed importance of compliance;  Counseled on the importance of exercise goals with target of 150 minutes per week Discussed plans with patient for ongoing care management follow up and provided patient with direct contact information for care management team; Advised patient, providing education and rationale, to monitor blood pressure daily and record, calling PCP for findings outside established parameters;  Provided education on prescribed diet low sodium;  Discussed complications of poorly controlled blood pressure such as heart disease, stroke, circulatory complications, vision complications, kidney impairment, sexual  dysfunction;  Screening for signs and symptoms of depression related to chronic disease state;  Assessed social determinant of health barriers;  Advanced directives packet mailed  Symptom Management: Take medications as prescribed   Attend all scheduled provider appointments Call pharmacy for medication refills 3-7 days in advance of running out of medications Attend church or other social activities Perform all self care activities independently  Perform IADL's (shopping, preparing meals, housekeeping, managing finances) independently Call provider office for new concerns or questions  check blood pressure weekly choose a place to take my blood pressure (home, clinic or office, retail store) write blood pressure results in a log or diary learn about high blood pressure keep a blood pressure log take blood pressure log to all doctor appointments keep all doctor appointments take medications for blood pressure exactly as prescribed report new symptoms to your doctor eat more whole grains, fruits and vegetables, lean meats and healthy fats Look over education sent via my chart- low sodium diet Advanced directives packet mailed  Follow Up Plan: Telephone follow up appointment with care management team member scheduled for:  04/13/21 at 9 am          Plan:Telephone follow up appointment with care management team member scheduled for:  04/13/21 at 9 am  Irving Shows [facility], BSN RN Case Manager [facility] [phone]

## 2022-02-16 NOTE — Plan of Care (Signed)
Chronic Care Management Provider Comprehensive Care Plan    02/16/2022 Name: Scott [name] MRN: [id number] DOB: 29-Apr-1955  Referral to Chronic Care Management (CCM) services was placed by Provider:  Delynn Flavin on Date: 01/10/22.  Chronic Condition 1: HYPERTENSION Provider Assessment and Plan  Hypertension associated with diabetes (HCC) - Plan: AMB Referral to Chronic Care Management Services   Expected Outcome/Goals Addressed This Visit (Provider CCM goals/Provider Assessment and plan  CCM (HYPERTENSION) EXPECTED OUTCOME: MONITOR, SELF-MANAGE AND REDUCE SYMPTOMS OF HYPERTENSION  Symptom Management Condition 1: Take medications as prescribed   Attend all scheduled provider appointments Call pharmacy for medication refills 3-7 days in advance of running out of medications Attend church or other social activities Perform all self care activities independently  Perform IADL's (shopping, preparing meals, housekeeping, managing finances) independently Call provider office for new concerns or questions  check blood pressure weekly choose a place to take my blood pressure (home, clinic or office, retail store) write blood pressure results in a log or diary learn about high blood pressure keep a blood pressure log take blood pressure log to all doctor appointments keep all doctor appointments take medications for blood pressure exactly as prescribed report new symptoms to your doctor eat more whole grains, fruits and vegetables, lean meats and healthy fats Look over education sent via my chart- low sodium diet Advanced directives packet mailed  Chronic Condition 2: DIABETES Provider Assessment and Plan  Uncontrolled type 2 diabetes mellitus with hyperglycemia (HCC) - Plan: Bayer DCA Hb A1c Waived, Microalbumin / creatinine urine ratio, AMB Referral to Chronic Care Management Services   Expected Outcome/Goals Addressed This Visit (Provider CCM goals/Provider Assessment and plan   CCM (DIABETES) EXPECTED OUTCOME:  MONITOR, SELF-MANAGE AND REDUCE SYMPTOMS OF DIABETES  Symptom Management Condition 2: Take medications as prescribed   Attend all scheduled provider appointments Call pharmacy for medication refills 3-7 days in advance of running out of medications Attend church or other social activities Perform all self care activities independently  Perform IADL's (shopping, preparing meals, housekeeping, managing finances) independently Call provider office for new concerns or questions  check blood sugar at prescribed times: when you have symptoms of low or high blood sugar and per doctor's order/ recommendation  check feet daily for cuts, sores or redness enter blood sugar readings and medication or insulin into daily log take the blood sugar log to all doctor visits take the blood sugar meter to all doctor visits trim toenails straight across fill half of plate with vegetables limit fast food meals to no more than 1 per week manage portion size read food labels for fat, fiber, carbohydrates and portion size Look over education sent via my chart- hypoglycemia Keep up your walking routine Pharmacist was notified you are almost out of ozempic  Problem List Patient Active Problem List   Diagnosis Date Noted   Hypertension associated with diabetes (HCC) 12/02/2018   Hyperlipidemia associated with type 2 diabetes mellitus (HCC) 12/02/2018   Primary insomnia 06/14/2017   Essential hypertension, benign 06/14/2017   OSA (obstructive sleep apnea) 08/09/2016   Cholelithiasis with chronic cholecystitis 01/09/2016   Vitamin D deficiency 10/06/2015   Hyperlipidemia 02/12/2013   Back pain, acute 12/13/2012   Elevated blood pressure 12/04/2012   H/O asbestosis 12/04/2012   Erectile dysfunction 12/04/2012   Neuropathy of both upper extremities 12/04/2012   Degenerative arthritis of cervical spine 12/04/2012   BPH (benign prostatic hyperplasia) 12/04/2012   GERD  (gastroesophageal reflux disease) 06/03/2012   Diabetes mellitus type  2, controlled (HCC) 06/03/2012    Medication Management  Current Outpatient Medications:    albuterol (VENTOLIN HFA) 108 (90 Base) MCG/ACT inhaler, Inhale 2 puffs into the lungs every 6 (six) hours as needed for wheezing., Disp: 1 each, Rfl: 3   Ascorbic Acid (VITAMIN C) 1000 MG tablet, Take 1,000 mg by mouth daily., Disp: , Rfl:    aspirin 81 MG tablet, Take 1 tablet (81 mg total) by mouth daily., Disp: 30 tablet, Rfl:    budesonide-formoterol (SYMBICORT) 160-4.5 MCG/ACT inhaler, Inhale 2 puffs into the lungs 2 (two) times daily., Disp: 30.6 g, Rfl: 12   Cholecalciferol (VITAMIN D) 50 MCG (2000 UT) CAPS, Take 2,000 Units by mouth daily., Disp: , Rfl:    cycloSPORINE (RESTASIS) 0.05 % ophthalmic emulsion, Place 1 drop into both eyes 2 (two) times daily. , Disp: , Rfl:    dapagliflozin propanediol (FARXIGA) 10 MG TABS tablet, Take 1 tablet (10 mg total) by mouth daily before breakfast., Disp: 90 tablet, Rfl: 4   diclofenac (VOLTAREN) 75 MG EC tablet, Take 1 tablet (75 mg total) by mouth 2 (two) times daily., Disp: 30 tablet, Rfl: 0   diclofenac sodium (VOLTAREN) 1 % GEL, Apply 4 g topically 4 (four) times daily., Disp: 350 g, Rfl: 1   lisinopril (ZESTRIL) 5 MG tablet, TAKE ONE (1) TABLET EACH DAY, Disp: 90 tablet, Rfl: 1   loratadine (CLARITIN) 10 MG tablet, Take 10 mg by mouth daily as needed for allergies., Disp: , Rfl:    metFORMIN (GLUCOPHAGE) 500 MG tablet, TAKE TWO TABLETS BY MOUTH TWICE DAILY, Disp: 360 tablet, Rfl: 0   naproxen sodium (ALEVE) 220 MG tablet, Take 440 mg by mouth 2 (two) times daily as needed (pain)., Disp: , Rfl:    omeprazole (PRILOSEC) 40 MG capsule, Take 40 mg by mouth daily., Disp: , Rfl:    rosuvastatin (CRESTOR) 10 MG tablet, TAKE ONE (1) TABLET EACH DAY, Disp: 90 tablet, Rfl: 1   Semaglutide,0.25 or 0.5MG /DOS, 2 MG/3ML SOPN, Inject 0.5mg  into the skin weekly for 4 weeks, then increase to 1mg  into  skin weekly (via patient assistance program), Disp: 3 mL, Rfl: 5   vitamin E 180 MG (400 UNITS) capsule, Take 800 Units by mouth daily., Disp: , Rfl:   Cognitive Assessment Identity Confirmed: : Name; DOB Cognitive Status: Normal   Functional Assessment Hearing Difficulty or Deaf: no Wear Glasses or Blind: yes Vision Management: can see well w/ glasses Concentrating, Remembering or Making Decisions Difficulty (CP): no Difficulty Communicating: no Difficulty Eating/Swallowing: no Walking or Climbing Stairs Difficulty: no Dressing/Bathing Difficulty: no Doing Errands Independently Difficulty (such as shopping) (CP): no Change in Functional Status Since Onset of Current Illness/Injury: no   Caregiver Assessment  Primary Source of Support/Comfort: spouse Name of Support/Comfort Primary Source: [employer] People in Home: spouse Name(s) of People in Home: Scott [name] Family Caregiver if Needed: none   Planned Interventions  Evaluation of current treatment plan related to hypertension self management and patient's adherence to plan as established by provider;   Reviewed prescribed diet low sodium Reviewed medications with patient and discussed importance of compliance;  Counseled on the importance of exercise goals with target of 150 minutes per week Discussed plans with patient for ongoing care management follow up and provided patient with direct contact information for care management team; Advised patient, providing education and rationale, to monitor blood pressure daily and record, calling PCP for findings outside established parameters;  Provided education on prescribed  diet low sodium;  Discussed complications of poorly controlled blood pressure such as heart disease, stroke, circulatory complications, vision complications, kidney impairment, sexual dysfunction;  Screening for signs and symptoms of depression related to chronic disease state;   Assessed social determinant of health barriers;  Advanced directives packet mailed Provided education to patient about basic DM disease process; Reviewed medications with patient and discussed importance of medication adherence;        Reviewed prescribed diet with patient carbohydrate modified, plate method; Counseled on importance of regular laboratory monitoring as prescribed;        Discussed plans with patient for ongoing care management follow up and provided patient with direct contact information for care management team;      Provided patient with written educational materials related to hypo and hyperglycemia and importance of correct treatment;       Advised patient, providing education and rationale, to check cbg when you have symptoms of low or high blood sugar and per doctor's order/ recommendation  and record        call provider for findings outside established parameters;       Screening for signs and symptoms of depression related to chronic disease state;        Assessed social determinant of health barriers;        Pain assessment completed In basket sent to pharmacist Vanice Sarah reporting pt is almost out of ozempic and needs samples/ assistance  Interaction and coordination with outside resources, practitioners, and providers See CCM Referral  Care Plan: Available in MyChart

## 2022-02-16 NOTE — Patient Instructions (Signed)
Please call the care guide team at [phone] if you need to cancel or reschedule your appointment.   If you are experiencing a Mental Health or [place] or need someone to talk to, please call the Suicide and Crisis Lifeline: 988 call the [place] National Suicide Prevention Lifeline: 9315246763 or TTY: 5093326309 TTY (248)618-1386) to talk to a trained counselor call 1-800-273-TALK (toll free, 24 hour hotline) go to [facility] [phone]) call the [facility]: [phone] call 911   Following is a copy of your full provider care plan:   Goals Addressed             This Visit's Progress    CCM (DIABETES) EXPECTED OUTCOME:  MONITOR, SELF-MANAGE AND REDUCE SYMPTOMS OF DIABETES       Current Barriers:  Knowledge Deficits related to Diabetes management Chronic Disease Management support and education needs related to Diabetes, diet No Advanced Directives in place- documents mailed Patient reports he checks CBG 1-2 x per week with fasting ranges 100-170 with today's reading 171, random ranges near 170. Patient states he used to have [employer] and did well with this but due to not being on insulin the costs was too high, states trying to eat healthy and be mindful of carbohydrate intake, pt is currently working with [facility] pharmacist, states he is almost out of ozempic and has last dose next week, would like pharmacist to be aware. Patient reports he walks 2 x week, recently retired from work. Patient has shoulder pain but states it is under better control.  Planned Interventions: Provided education to patient about basic DM disease process; Reviewed medications with patient and discussed importance of medication adherence;        Reviewed prescribed diet with patient carbohydrate modified, plate method; Counseled on importance of regular laboratory monitoring as prescribed;         Discussed plans with patient for ongoing care management follow up and provided patient with direct contact information for care management team;      Provided patient with written educational materials related to hypo and hyperglycemia and importance of correct treatment;       Advised patient, providing education and rationale, to check cbg when you have symptoms of low or high blood sugar and per doctor's order/ recommendation  and record        call provider for findings outside established parameters;       Screening for signs and symptoms of depression related to chronic disease state;        Assessed social determinant of health barriers;        Pain assessment completed In basket sent to pharmacist Lottie Dawson reporting pt is almost out of ozempic and needs samples/ assistance  Symptom Management: Take medications as prescribed   Attend all scheduled provider appointments Call pharmacy for medication refills 3-7 days in advance of running out of medications Attend church or other social activities Perform all self care activities independently  Perform IADL's (shopping, preparing meals, housekeeping, managing finances) independently Call provider office for new concerns or questions  check blood sugar at prescribed times: when you have symptoms of low or high blood sugar and per doctor's order/ recommendation  check feet daily for cuts, sores or redness enter blood sugar readings and medication or insulin into daily log take the blood sugar log to all doctor visits take the blood sugar meter to all doctor visits trim toenails straight  across fill half of plate with vegetables limit fast food meals to no more than 1 per week manage portion size read food labels for fat, fiber, carbohydrates and portion size Look over education sent via my chart- hypoglycemia Keep up your walking routine Pharmacist was notified you are almost out of ozempic  Follow Up Plan: Telephone follow up  appointment with care management team member scheduled for: 04/13/21 at 9 am        CCM (HYPERTENSION) EXPECTED OUTCOME: MONITOR, SELF-MANAGE AND REDUCE SYMPTOMS OF HYPERTENSION       Current Barriers:  Knowledge Deficits related to Hypertension management Chronic Disease Management support and education needs related to Hypertension, diet No Advanced Directives in place- pt requests documents be mailed Patient reports he lives with spouse, is independent with all aspects of his care, continues to drive, recently retired from plumbing. Patient reports he has blood pressure cuff but does not check at home, BP is checked at provider appointments and [employer].  Planned Interventions: Evaluation of current treatment plan related to hypertension self management and patient's adherence to plan as established by provider;   Reviewed prescribed diet low sodium Reviewed medications with patient and discussed importance of compliance;  Counseled on the importance of exercise goals with target of 150 minutes per week Discussed plans with patient for ongoing care management follow up and provided patient with direct contact information for care management team; Advised patient, providing education and rationale, to monitor blood pressure daily and record, calling PCP for findings outside established parameters;  Provided education on prescribed diet low sodium;  Discussed complications of poorly controlled blood pressure such as heart disease, stroke, circulatory complications, vision complications, kidney impairment, sexual dysfunction;  Screening for signs and symptoms of depression related to chronic disease state;  Assessed social determinant of health barriers;  Advanced directives packet mailed  Symptom Management: Take medications as prescribed   Attend all scheduled provider appointments Call pharmacy for medication refills 3-7 days in advance of running out of medications Attend church or other  social activities Perform all self care activities independently  Perform IADL's (shopping, preparing meals, housekeeping, managing finances) independently Call provider office for new concerns or questions  check blood pressure weekly choose a place to take my blood pressure (home, clinic or office, retail store) write blood pressure results in a log or diary learn about high blood pressure keep a blood pressure log take blood pressure log to all doctor appointments keep all doctor appointments take medications for blood pressure exactly as prescribed report new symptoms to your doctor eat more whole grains, fruits and vegetables, lean meats and healthy fats Look over education sent via my chart- low sodium diet Advanced directives packet mailed  Follow Up Plan: Telephone follow up appointment with care management team member scheduled for:  04/13/21 at 9 am          Patient verbalizes understanding of instructions and care plan provided today and agrees to view in [place]. Active MyChart status and patient understanding of how to access instructions and care plan via MyChart confirmed with patient.     Telephone follow up appointment with care management team member scheduled for:  04/13/21 at 9 am  Low-Sodium Eating Plan Sodium, which is an element that makes up salt, helps you maintain a healthy balance of fluids in your body. Too much sodium can increase your blood pressure and cause fluid and waste to be held in your body. Your health care provider or dietitian may  recommend following this plan if you have high blood pressure (hypertension), kidney disease, liver disease, or heart failure. Eating less sodium can help lower your blood pressure, reduce swelling, and protect your heart, liver, and kidneys. What are tips for following this plan? Reading food labels The Nutrition Facts label lists the amount of sodium in one serving of the food. If you eat more than one serving, you  must multiply the listed amount of sodium by the number of servings. Choose foods with less than 140 mg of sodium per serving. Avoid foods with 300 mg of sodium or more per serving. Shopping  Look for lower-sodium products, often labeled as "low-sodium" or "no salt added." Always check the sodium content, even if foods are labeled as "unsalted" or "no salt added." Buy fresh foods. Avoid canned foods and pre-made or frozen meals. Avoid canned, cured, or processed meats. Buy breads that have less than 80 mg of sodium per slice. Cooking  Eat more home-cooked food and less restaurant, buffet, and fast food. Avoid adding salt when cooking. Use salt-free seasonings or herbs instead of table salt or sea salt. Check with your health care provider or pharmacist before using salt substitutes. Cook with plant-based oils, such as canola, sunflower, or olive oil. Meal planning When eating at a restaurant, ask that your food be prepared with less salt or no salt, if possible. Avoid dishes labeled as brined, pickled, cured, smoked, or made with soy sauce, miso, or teriyaki sauce. Avoid foods that contain MSG (monosodium glutamate). MSG is sometimes added to [place] food, bouillon, and some canned foods. Make meals that can be grilled, baked, poached, roasted, or steamed. These are generally made with less sodium. General information Most people on this plan should limit their sodium intake to 1,500-2,000 mg (milligrams) of sodium each day. What foods should I eat? Fruits Fresh, frozen, or canned fruit. Fruit juice. Vegetables Fresh or frozen vegetables. "No salt added" canned vegetables. "No salt added" tomato sauce and paste. Low-sodium or reduced-sodium tomato and vegetable juice. Grains Low-sodium cereals, including oats, puffed wheat and rice, and shredded wheat. Low-sodium crackers. Unsalted rice. Unsalted pasta. Low-sodium bread. Whole-grain breads and whole-grain pasta. Meats and other  proteins Fresh or frozen (no salt added) meat, poultry, seafood, and fish. Low-sodium canned tuna and salmon. Unsalted nuts. Dried peas, beans, and lentils without added salt. Unsalted canned beans. Eggs. Unsalted nut butters. Dairy Milk. Soy milk. Cheese that is naturally low in sodium, such as ricotta cheese, fresh mozzarella, or Swiss cheese. Low-sodium or reduced-sodium cheese. Cream cheese. Yogurt. Seasonings and condiments Fresh and dried herbs and spices. Salt-free seasonings. Low-sodium mustard and ketchup. Sodium-free salad dressing. Sodium-free light mayonnaise. Fresh or refrigerated horseradish. Lemon juice. Vinegar. Other foods Homemade, reduced-sodium, or low-sodium soups. Unsalted popcorn and pretzels. Low-salt or salt-free chips. The items listed above may not be a complete list of foods and beverages you can eat. Contact a dietitian for more information. What foods should I avoid? Vegetables Sauerkraut, pickled vegetables, and relishes. Olives. [place] fries. Onion rings. Regular canned vegetables (not low-sodium or reduced-sodium). Regular canned tomato sauce and paste (not low-sodium or reduced-sodium). Regular tomato and vegetable juice (not low-sodium or reduced-sodium). Frozen vegetables in sauces. Grains Instant hot cereals. Bread stuffing, pancake, and biscuit mixes. Croutons. Seasoned rice or pasta mixes. Noodle soup cups. Boxed or frozen macaroni and cheese. Regular salted crackers. Self-rising flour. Meats and other proteins Meat or fish that is salted, canned, smoked, spiced, or pickled. Precooked or cured meat, such  as sausages or meat loaves. Berniece Salines. Ham. Pepperoni. Hot dogs. Corned beef. Chipped beef. Salt pork. Jerky. Pickled herring. Anchovies and sardines. Regular canned tuna. Salted nuts. Dairy Processed cheese and cheese spreads. Hard cheeses. Cheese curds. Blue cheese. Feta cheese. String cheese. Regular cottage cheese. Buttermilk. Canned milk. Fats and  oils Salted butter. Regular margarine. Ghee. Bacon fat. Seasonings and condiments Onion salt, garlic salt, seasoned salt, table salt, and sea salt. Canned and packaged gravies. Worcestershire sauce. Tartar sauce. Barbecue sauce. Teriyaki sauce. Soy sauce, including reduced-sodium. Steak sauce. Fish sauce. Oyster sauce. Cocktail sauce. Horseradish that you find on the shelf. Regular ketchup and mustard. Meat flavorings and tenderizers. Bouillon cubes. Hot sauce. Pre-made or packaged marinades. Pre-made or packaged taco seasonings. Relishes. Regular salad dressings. Salsa. Other foods Salted popcorn and pretzels. Corn chips and puffs. Potato and tortilla chips. Canned or dried soups. Pizza. Frozen entrees and pot pies. The items listed above may not be a complete list of foods and beverages you should avoid. Contact a dietitian for more information. Summary Eating less sodium can help lower your blood pressure, reduce swelling, and protect your heart, liver, and kidneys. Most people on this plan should limit their sodium intake to 1,500-2,000 mg (milligrams) of sodium each day. Canned, boxed, and frozen foods are high in sodium. Restaurant foods, fast foods, and pizza are also very high in sodium. You also get sodium by adding salt to food. Try to cook at home, eat more fresh fruits and vegetables, and eat less fast food and canned, processed, or prepared foods. This information is not intended to replace advice given to you by your health care provider. Make sure you discuss any questions you have with your health care provider. Document Revised: 04/03/2019 Document Reviewed: 01/28/2019 Elsevier Patient Education  [place]. Hypoglycemia Hypoglycemia is when the sugar (glucose) level in your blood is too low. Low blood sugar can happen to people who have diabetes and people who do not have diabetes. Low blood sugar can happen quickly, and it can be an emergency. What are the causes? This  condition happens most often in people who have diabetes. It may be caused by: Diabetes medicine. Not eating enough, or not eating often enough. Doing more physical activity. Drinking alcohol on an empty stomach. If you do not have diabetes, this condition may be caused by: A tumor in the pancreas. Not eating enough, or not eating for long periods at a time (fasting). A very bad infection or illness. Problems after having weight loss (bariatric) surgery. Kidney failure or liver failure. Certain medicines. What increases the risk? This condition is more likely to develop in people who: Have diabetes and take medicines to lower their blood sugar. Abuse alcohol. Have a very bad illness. What are the signs or symptoms? Mild Hunger. Sweating and feeling clammy. Feeling dizzy or light-headed. Being sleepy or having trouble sleeping. Feeling like you may vomit (nauseous). A fast heartbeat. A headache. Blurry vision. Mood changes, such as: Being grouchy. Feeling worried or nervous (anxious). Tingling or loss of feeling (numbness) around your mouth, lips, or tongue. Moderate Confusion and poor judgment. Behavior changes. Weakness. Uneven heartbeat. Trouble with moving (coordination). Very low Very low blood sugar (severe hypoglycemia) is a medical emergency. It can cause: Fainting. Seizures. Loss of consciousness (coma). Death. How is this treated? Treating low blood sugar Low blood sugar is often treated by eating or drinking something that has sugar in it right away. The food or drink should contain 15  grams of a fast-acting carb (carbohydrate). Options include: 4 oz (120 mL) of fruit juice. 4 oz (120 mL) of regular soda (not diet soda). A few pieces of hard candy. Check food labels to see how many pieces to eat for 15 grams. 1 Tbsp (15 mL) of sugar or honey. 4 glucose tablets. 1 tube of glucose gel. Treating low blood sugar if you have diabetes If you can think clearly  and swallow safely, follow the 15:15 rule: Take 15 grams of a fast-acting carb. Talk with your doctor about how much you should take. Always keep a source of fast-acting carb with you, such as: Glucose tablets (take 4 tablets). A few pieces of hard candy. Check food labels to see how many pieces to eat for 15 grams. 4 oz (120 mL) of fruit juice. 4 oz (120 mL) of regular soda (not diet soda). 1 Tbsp (15 mL) of honey or sugar. 1 tube of glucose gel. Check your blood sugar 15 minutes after you take the carb. If your blood sugar is still at or below 70 mg/dL (3.9 mmol/L), take 15 grams of a carb again. If your blood sugar does not go above 70 mg/dL (3.9 mmol/L) after 3 tries, get help right away. After your blood sugar goes back to normal, eat a meal or a snack within 1 hour.  Treating very low blood sugar If your blood sugar is below 54 mg/dL (3 mmol/L), you have very low blood sugar, or severe hypoglycemia. This is an emergency. Get medical help right away. If you have very low blood sugar and you cannot eat or drink, you will need to be given a hormone called glucagon. A family member or friend should learn how to check your blood sugar and how to give you glucagon. Ask your doctor if you need to have an emergency glucagon kit at home. Very low blood sugar may also need to be treated in a hospital. Follow these instructions at home: General instructions Take over-the-counter and prescription medicines only as told by your doctor. Stay aware of your blood sugar as told by your doctor. If you drink alcohol: Limit how much you have to: 0-1 drink a day for women who are not pregnant. 0-2 drinks a day for men. Know how much alcohol is in your drink. In the [place] one drink equals one 12 oz bottle of beer (355 mL), one 5 oz glass of wine (148 mL), or one 1 oz glass of hard liquor (44 mL). Be sure to eat food when you drink alcohol. Know that your body absorbs alcohol quickly. This may lead to  low blood sugar later. Be sure to keep checking your blood sugar. Keep all follow-up visits. If you have diabetes:  Always have a fast-acting carb (15 grams) with you to treat low blood sugar. Follow your diabetes care plan as told by your doctor. Make sure you: Know the symptoms of low blood sugar. Check your blood sugar as often as told. Always check it before and after exercise. Always check your blood sugar before you drive. Take your medicines as told. Follow your meal plan. Eat on time. Do not skip meals. Share your diabetes care plan with: Your work or school. People you live with. Carry a card or wear jewelry that says you have diabetes. Where to find more information American Diabetes Association: www.diabetes.org Contact a doctor if: You have trouble keeping your blood sugar in your target range. You have low blood sugar often. Get help  right away if: You still have symptoms after you eat or drink something that contains 15 grams of fast-acting carb, and you cannot get your blood sugar above 70 mg/dL by following the 15:15 rule. Your blood sugar is below 54 mg/dL (3 mmol/L). You have a seizure. You faint. These symptoms may be an emergency. Get help right away. Call your local emergency services (911 in the [place] Do not wait to see if the symptoms will go away. Do not drive yourself to the hospital. Summary Hypoglycemia happens when the level of sugar (glucose) in your blood is too low. Low blood sugar can happen to people who have diabetes and people who do not have diabetes. Low blood sugar can happen quickly, and it can be an emergency. Make sure you know the symptoms of low blood sugar and know how to treat it. Always keep a source of sugar (fast-acting carb) with you to treat low blood sugar. This information is not intended to replace advice given to you by your health care provider. Make sure you discuss any questions you have with your health care  provider. Document Revised: 01/28/2020 Document Reviewed: 01/28/2020 Elsevier Patient Education  [place].

## 2022-02-28 ENCOUNTER — Encounter: Payer: Self-pay | Admitting: Nurse Practitioner

## 2022-02-28 ENCOUNTER — Telehealth (INDEPENDENT_AMBULATORY_CARE_PROVIDER_SITE_OTHER): Payer: Medicare Other | Admitting: Nurse Practitioner

## 2022-02-28 DIAGNOSIS — J0101 Acute recurrent maxillary sinusitis: Secondary | ICD-10-CM | POA: Diagnosis not present

## 2022-02-28 MED ORDER — AZITHROMYCIN 250 MG PO TABS: ORAL_TABLET | ORAL | 0 refills | Status: DC

## 2022-02-28 NOTE — Patient Instructions (Signed)
1. Take meds as prescribed 2. Use a cool mist humidifier especially during the winter months and when heat has been humid. 3. Use saline nose sprays frequently 4. Saline irrigations of the nose can be very helpful if done frequently.  * 4X daily for 1 week*  * Use of a nettie pot can be helpful with this. Follow directions with this* 5. Drink plenty of fluids 6. Keep thermostat turn down low 7.For any cough or congestion- mucinex if needed 8. For fever or aces or pains- take tylenol or ibuprofen appropriate for age and weight.  * for fevers greater than 101 orally you may alternate ibuprofen and tylenol every  3 hours.    

## 2022-02-28 NOTE — Progress Notes (Signed)
Virtual Visit Consent   Scott [name], you are scheduled for a virtual visit with Scott Daphine Deutscher, FNP, a [facility] provider, [name].     Just as with appointments in the office, your consent must be obtained to participate.  Your consent will be active for this visit and any virtual visit you may have with one of our providers in the next 365 days.     If you have a MyChart account, a copy of this consent can be sent to you electronically.  All virtual visits are billed to your insurance company just like a traditional visit in the office.    As this is a virtual visit, video technology does not allow for your provider to perform a traditional examination.  This may limit your provider's ability to fully assess your condition.  If your provider identifies any concerns that need to be evaluated in person or the need to arrange testing (such as labs, EKG, etc.), we will make arrangements to do so.     Although advances in technology are sophisticated, we cannot ensure that it will always work on either your end or our end.  If the connection with a video visit is poor, the visit may have to be switched to a telephone visit.  With either a video or telephone visit, we are not always able to ensure that we have a secure connection.     I need to obtain your verbal consent now.   Are you willing to proceed with your visit [name]? YES   Scott [name] has provided verbal consent on 02/28/2022 for a virtual visit (video or telephone).   Scott Daphine Deutscher, FNP   Date: 02/28/2022 1:16 PM   Virtual Visit via Video Note   I, Scott [name], connected with Scott [name], Scott [name], 1957) on 02/28/22 at  3:00 PM EST by a video-enabled telemedicine application and verified that I am speaking with the correct person using two identifiers.  Location: Patient: Virtual Visit Location Patient: Home Provider: Virtual Visit Location Provider: Mobile   I discussed the  limitations of evaluation and management by telemedicine and the availability of in person appointments. The patient expressed understanding and agreed to proceed.    History of Present Illness: Scott [name] is a 66 y.o. who identifies as a male who was assigned male at birth, and is being seen [name] for uri.  HPI: Has frequent sinusitis that affects his teeth.  Sinusitis This is a new problem. The current episode started yesterday. The problem has been waxing and waning since onset. There has been no fever. Associated symptoms include congestion, headaches and sinus pressure. Pertinent negatives include no coughing. (Teeth hurt to chew) Past treatments include acetaminophen. The treatment provided mild relief.    Review of Systems  HENT:  Positive for congestion and sinus pressure.   Respiratory:  Negative for cough.   Neurological:  Positive for headaches.    Problems:  Patient Active Problem List   Diagnosis Date Noted   Hypertension associated with diabetes (HCC) 12/02/2018   Hyperlipidemia associated with type 2 diabetes mellitus (HCC) 12/02/2018   Primary insomnia 06/14/2017   Essential hypertension, benign 06/14/2017   OSA (obstructive sleep apnea) 08/09/2016   Cholelithiasis with chronic cholecystitis 01/09/2016   Vitamin D deficiency 10/06/2015   Hyperlipidemia 02/12/2013   Back pain, acute 12/13/2012   Elevated blood pressure 12/04/2012   H/O asbestosis 12/04/2012   Erectile dysfunction 12/04/2012   Neuropathy of both upper extremities 12/04/2012   Degenerative arthritis  of cervical spine 12/04/2012   BPH (benign prostatic hyperplasia) 12/04/2012   GERD (gastroesophageal reflux disease) 06/03/2012   Diabetes mellitus type 2, controlled (HCC) 06/03/2012    Allergies:  Allergies  Allergen Reactions   Augmentin [Amoxicillin-Pot Clavulanate] Itching and Nausea And Vomiting   Codeine Itching and Nausea And Vomiting   Penicillins Hives and Itching    Has patient had  a PCN reaction causing immediate rash, facial/tongue/throat swelling, SOB or lightheadedness with hypotension: No Has patient had a PCN reaction causing severe rash involving mucus membranes or skin necrosis: No Has patient had a PCN reaction that required hospitalization No Has patient had a PCN reaction occurring within the last 10 years: No If all of the above answers are "NO", then may proceed with Cephalosporin use.    Atorvastatin     Myalgias and fatigue   Oxycodone Hcl Hives, Itching and Other (See Comments)   Medications:  Current Outpatient Medications:    albuterol (VENTOLIN HFA) 108 (90 Base) MCG/ACT inhaler, Inhale 2 puffs into the lungs every 6 (six) hours as needed for wheezing., Disp: 1 each, Rfl: 3   Ascorbic Acid (VITAMIN C) 1000 MG tablet, Take 1,000 mg by mouth daily., Disp: , Rfl:    aspirin 81 MG tablet, Take 1 tablet (81 mg total) by mouth daily., Disp: 30 tablet, Rfl:    budesonide-formoterol (SYMBICORT) 160-4.5 MCG/ACT inhaler, Inhale 2 puffs into the lungs 2 (two) times daily., Disp: 30.6 g, Rfl: 12   Cholecalciferol (VITAMIN D) 50 MCG (2000 UT) CAPS, Take 2,000 Units by mouth daily., Disp: , Rfl:    cycloSPORINE (RESTASIS) 0.05 % ophthalmic emulsion, Place 1 drop into both eyes 2 (two) times daily. , Disp: , Rfl:    dapagliflozin propanediol (FARXIGA) 10 MG TABS tablet, Take 1 tablet (10 mg total) by mouth daily before breakfast., Disp: 90 tablet, Rfl: 4   diclofenac (VOLTAREN) 75 MG EC tablet, Take 1 tablet (75 mg total) by mouth 2 (two) times daily., Disp: 30 tablet, Rfl: 0   diclofenac sodium (VOLTAREN) 1 % GEL, Apply 4 g topically 4 (four) times daily., Disp: 350 g, Rfl: 1   lisinopril (ZESTRIL) 5 MG tablet, TAKE ONE (1) TABLET EACH DAY, Disp: 90 tablet, Rfl: 1   loratadine (CLARITIN) 10 MG tablet, Take 10 mg by mouth daily as needed for allergies., Disp: , Rfl:    metFORMIN (GLUCOPHAGE) 500 MG tablet, TAKE TWO TABLETS BY MOUTH TWICE DAILY, Disp: 360 tablet, Rfl:  0   naproxen sodium (ALEVE) 220 MG tablet, Take 440 mg by mouth 2 (two) times daily as needed (pain)., Disp: , Rfl:    omeprazole (PRILOSEC) 40 MG capsule, Take 40 mg by mouth daily., Disp: , Rfl:    rosuvastatin (CRESTOR) 10 MG tablet, TAKE ONE (1) TABLET EACH DAY, Disp: 90 tablet, Rfl: 1   Semaglutide,0.25 or 0.5MG /DOS, 2 MG/3ML SOPN, Inject 0.5mg  into the skin weekly for 4 weeks, then increase to 1mg  into skin weekly (via patient assistance program), Disp: 3 mL, Rfl: 5   vitamin E 180 MG (400 UNITS) capsule, Take 800 Units by mouth daily., Disp: , Rfl:   Observations/Objective: Patient is well-developed, well-nourished in no acute distress.  Resting comfortably  at home.  Head is normocephalic, atraumatic.  No labored breathing.  Speech is clear and coherent with logical content.  Patient is alert and oriented at baseline.  Facial pressure bil  Assessment and Plan:  Scott [name] with chief complaint of Cough  1. Acute recurrent maxillary sinusitis 1. Take meds as prescribed 2. Use a cool mist humidifier especially during the winter months and when heat has been humid. 3. Use saline nose sprays frequently 4. Saline irrigations of the nose can be very helpful if done frequently.  * 4X daily for 1 week*  * Use of a nettie pot can be helpful with this. Follow directions with this* 5. Drink plenty of fluids 6. Keep thermostat turn down low 7.For any cough or congestion- mucinex if needed 8. For fever or aces or pains- take tylenol or ibuprofen appropriate for age and weight.  * for fevers greater than 101 orally you may alternate ibuprofen and tylenol every  3 hours.    Meds ordered this encounter  Medications   azithromycin (ZITHROMAX Z-PAK) 250 MG tablet    Sig: As directed    Dispense:  6 tablet    Refill:  0    Order Specific Question:   Supervising Provider    Answer:   Nils Pyle [[id number]]   Keep appointment with dentist  Follow Up  Instructions: I discussed the assessment and treatment plan with the patient. The patient was provided an opportunity to ask questions and all were answered. The patient agreed with the plan and demonstrated an understanding of the instructions.  A copy of instructions were sent to the patient via MyChart.  The patient was advised to call back or seek an in-person evaluation if the symptoms worsen or if the condition fails to improve as anticipated.  Time:  I spent 9 minutes with the patient via telehealth technology discussing the above problems/concerns.    Scott Daphine Deutscher, FNP

## 2022-03-07 ENCOUNTER — Other Ambulatory Visit: Payer: Self-pay | Admitting: Family Medicine

## 2022-03-07 DIAGNOSIS — E1169 Type 2 diabetes mellitus with other specified complication: Secondary | ICD-10-CM

## 2022-03-07 DIAGNOSIS — E1159 Type 2 diabetes mellitus with other circulatory complications: Secondary | ICD-10-CM

## 2022-03-07 DIAGNOSIS — G4733 Obstructive sleep apnea (adult) (pediatric): Secondary | ICD-10-CM | POA: Diagnosis not present

## 2022-03-10 DIAGNOSIS — G4733 Obstructive sleep apnea (adult) (pediatric): Secondary | ICD-10-CM | POA: Diagnosis not present

## 2022-03-10 DIAGNOSIS — R0681 Apnea, not elsewhere classified: Secondary | ICD-10-CM | POA: Diagnosis not present

## 2022-03-11 DIAGNOSIS — E1159 Type 2 diabetes mellitus with other circulatory complications: Secondary | ICD-10-CM | POA: Diagnosis not present

## 2022-03-11 DIAGNOSIS — I10 Essential (primary) hypertension: Secondary | ICD-10-CM

## 2022-03-19 NOTE — Telephone Encounter (Signed)
2024 ENROLLMENT FOR FARXIGA ONLY.  SYMBICORT NO LONGER ON PROGRAM.

## 2022-03-27 ENCOUNTER — Telehealth: Payer: Self-pay | Admitting: Family Medicine

## 2022-03-28 NOTE — Telephone Encounter (Signed)
Sample left, pt aware

## 2022-03-29 ENCOUNTER — Telehealth: Payer: Self-pay | Admitting: Pharmacist

## 2022-03-29 NOTE — Telephone Encounter (Signed)
Ozempic 1mg  weekly 4 boxes here for p/u Patient aware

## 2022-04-09 NOTE — Telephone Encounter (Signed)
Received notification from [place] regarding approval for Welcome. Patient assistance approved from 03/30/22 to 03/12/23.  MEDICATION WILL SHIP TO OFFICE  Phone: [phone]

## 2022-04-10 DIAGNOSIS — R0681 Apnea, not elsewhere classified: Secondary | ICD-10-CM | POA: Diagnosis not present

## 2022-04-10 DIAGNOSIS — G4733 Obstructive sleep apnea (adult) (pediatric): Secondary | ICD-10-CM | POA: Diagnosis not present

## 2022-04-13 ENCOUNTER — Telehealth: Payer: Medicare Other

## 2022-04-16 ENCOUNTER — Other Ambulatory Visit: Payer: Self-pay | Admitting: Family Medicine

## 2022-04-19 ENCOUNTER — Ambulatory Visit (INDEPENDENT_AMBULATORY_CARE_PROVIDER_SITE_OTHER): Payer: Medicare Other | Admitting: *Deleted

## 2022-04-19 DIAGNOSIS — I10 Essential (primary) hypertension: Secondary | ICD-10-CM

## 2022-04-19 DIAGNOSIS — E1165 Type 2 diabetes mellitus with hyperglycemia: Secondary | ICD-10-CM

## 2022-04-19 NOTE — Patient Instructions (Signed)
Please call the care guide team at [phone] if you need to cancel or reschedule your appointment.   If you are experiencing a Mental Health or [place] or need someone to talk to, please call the Suicide and Crisis Lifeline: 988 call the [place] National Suicide Prevention Lifeline: 773-877-5394 or TTY: (726) 816-2718 TTY (863) 268-6697) to talk to a trained counselor call 1-800-273-TALK (toll free, 24 hour hotline) go to [facility] [address], [place] [phone]) call the [facility]: [phone] call 911   Following is a copy of the CCM Program Consent:  CCM service includes personalized support from designated clinical staff supervised by the physician, including individualized plan of care and coordination with other care providers 24/7 contact phone numbers for assistance for urgent and routine care needs. Service will only be billed when office clinical staff spend 20 minutes or more in a month to coordinate care. Only one practitioner may furnish and bill the service in a calendar month. The patient may stop CCM services at amy time (effective at the end of the month) by phone call to the office staff. The patient will be responsible for cost sharing (co-pay) or up to 20% of the service fee (after annual deductible is met)  Following is a copy of your full provider care plan:   Goals Addressed             This Visit's Progress    CCM (DIABETES) EXPECTED OUTCOME:  MONITOR, SELF-MANAGE AND REDUCE SYMPTOMS OF DIABETES       Current Barriers:  Knowledge Deficits related to Diabetes management Chronic Disease Management support and education needs related to Diabetes, diet No Advanced Directives in place- documents mailed Patient reports he checks CBG 1-2 x per week with fasting ranges usually in low 100's with today's reading 131, random ranges near 170. Patient states he used to have [employer]  and did well with this but due to not being on insulin the costs was too high, states trying to eat healthy and be mindful of carbohydrate intake, pt is currently working with [facility] pharmacist, states he has ozempic and all medications and taking as prescribed. Patient reports he walks 2 x week, recently retired from work. Patient has chronic shoulder pain but states it is under better control.  Planned Interventions: Reviewed medications with patient and discussed importance of medication adherence;        Reviewed prescribed diet with patient carbohydrate modified, plate method; Counseled on importance of regular laboratory monitoring as prescribed;        Provided patient with written educational materials related to hypo and hyperglycemia and importance of correct treatment;       Advised patient, providing education and rationale, to check cbg when you have symptoms of low or high blood sugar and per doctor's order/ recommendation  and record        call provider for findings outside established parameters;       Review of patient status, including review of consultants reports, relevant laboratory and other test results, and medications completed;       Pain assessment completed Reinforced importance of exercise  Symptom Management: Take medications as prescribed   Attend all scheduled provider appointments Call pharmacy for medication refills 3-7 days in advance of running out of medications Attend church or other social activities Perform all self care activities independently  Perform IADL's (shopping, preparing meals, housekeeping, managing finances) independently Call provider office for new concerns or  questions  check blood sugar at prescribed times: when you have symptoms of low or high blood sugar and per doctor's order/ recommendation  check feet daily for cuts, sores or redness enter blood sugar readings and medication or insulin into daily log take the blood sugar log to  all doctor visits take the blood sugar meter to all doctor visits trim toenails straight across eat fish at least once per week fill half of plate with vegetables limit fast food meals to no more than 1 per week manage portion size read food labels for fat, fiber, carbohydrates and portion size wash and dry feet carefully every day wear comfortable, cotton socks wear comfortable, well-fitting shoes Keep up your walking routine Always talk to your pharmacist at primary care provider office if you are unable to afford a medication or need samples  Follow Up Plan: Telephone follow up appointment with care management team member scheduled for:  07/18/22 at 9 am        CCM (HYPERTENSION) EXPECTED OUTCOME: MONITOR, SELF-MANAGE AND REDUCE SYMPTOMS OF HYPERTENSION       Current Barriers:  Knowledge Deficits related to Hypertension management Chronic Disease Management support and education needs related to Hypertension, diet No Advanced Directives in place- documents previously mailed Patient reports he lives with spouse, is independent with all aspects of his care, continues to drive, recently retired from [employer]. Patient reports he has blood pressure cuff but does not check at home, BP is checked at provider appointments and [employer]. Patient reports he is trying to walk for exercise several times per week  Planned Interventions: Evaluation of current treatment plan related to hypertension self management and patient's adherence to plan as established by provider;   Reviewed prescribed diet low sodium Reviewed medications with patient and discussed importance of compliance;  Counseled on the importance of exercise goals with target of 150 minutes per week Discussed plans with patient for ongoing care management follow up and provided patient with direct contact information for care management team; Advised patient, providing education and rationale, to monitor blood pressure daily and  record, calling PCP for findings outside established parameters;  Advised patient to discuss any issues with blood pressure, medications, change in health status with provider; Provided education on prescribed diet low sodium;  Discussed complications of poorly controlled blood pressure such as heart disease, stroke, circulatory complications, vision complications, kidney impairment, sexual dysfunction;   Symptom Management: Take medications as prescribed   Attend all scheduled provider appointments Call pharmacy for medication refills 3-7 days in advance of running out of medications Attend church or other social activities Perform all self care activities independently  Perform IADL's (shopping, preparing meals, housekeeping, managing finances) independently Call provider office for new concerns or questions  check blood pressure weekly choose a place to take my blood pressure (home, clinic or office, retail store) write blood pressure results in a log or diary learn about high blood pressure keep a blood pressure log take blood pressure log to all doctor appointments call doctor for signs and symptoms of high blood pressure keep all doctor appointments take medications for blood pressure exactly as prescribed report new symptoms to your doctor eat more whole grains, fruits and vegetables, lean meats and healthy fats Follow low sodium diet- read food labels  Follow Up Plan: Telephone follow up appointment with care management team member scheduled for:  5/823 at 9 am          Patient verbalizes understanding of instructions and care plan  provided today and agrees to view in [place]. Active MyChart status and patient understanding of how to access instructions and care plan via MyChart confirmed with patient.     Telephone follow up appointment with care management team member scheduled for:  07/18/22 at 9 am

## 2022-04-19 NOTE — Chronic Care Management (AMB) (Signed)
Chronic Care Management   CCM RN Visit Note  04/19/2022 Name: Scott [name] MRN: [id number] DOB: 1956-02-04  Subjective: Scott [name] is a 67 y.o. year old male who is a primary care patient of Janora Norlander, DO. The patient was referred to the Chronic Care Management team for assistance with care management needs subsequent to provider initiation of CCM services and plan of care.    Today's Visit:  Engaged with patient by telephone for follow up visit.        Goals Addressed             This Visit's Progress    CCM (DIABETES) EXPECTED OUTCOME:  MONITOR, SELF-MANAGE AND REDUCE SYMPTOMS OF DIABETES       Current Barriers:  Knowledge Deficits related to Diabetes management Chronic Disease Management support and education needs related to Diabetes, diet No Advanced Directives in place- documents mailed Patient reports he checks CBG 1-2 x per week with fasting ranges usually in low 100's with today's reading 131, random ranges near 170. Patient states he used to have [employer] and did well with this but due to not being on insulin the costs was too high, states trying to eat healthy and be mindful of carbohydrate intake, pt is currently working with [facility] pharmacist, states he has ozempic and all medications and taking as prescribed. Patient reports he walks 2 x week, recently retired from work. Patient has chronic shoulder pain but states it is under better control.  Planned Interventions: Reviewed medications with patient and discussed importance of medication adherence;        Reviewed prescribed diet with patient carbohydrate modified, plate method; Counseled on importance of regular laboratory monitoring as prescribed;        Provided patient with written educational materials related to hypo and hyperglycemia and importance of correct treatment;       Advised patient, providing education and rationale, to check cbg when you have symptoms of low or high blood sugar  and per doctor's order/ recommendation  and record        call provider for findings outside established parameters;       Review of patient status, including review of consultants reports, relevant laboratory and other test results, and medications completed;       Pain assessment completed Reinforced importance of exercise  Symptom Management: Take medications as prescribed   Attend all scheduled provider appointments Call pharmacy for medication refills 3-7 days in advance of running out of medications Attend church or other social activities Perform all self care activities independently  Perform IADL's (shopping, preparing meals, housekeeping, managing finances) independently Call provider office for new concerns or questions  check blood sugar at prescribed times: when you have symptoms of low or high blood sugar and per doctor's order/ recommendation  check feet daily for cuts, sores or redness enter blood sugar readings and medication or insulin into daily log take the blood sugar log to all doctor visits take the blood sugar meter to all doctor visits trim toenails straight across eat fish at least once per week fill half of plate with vegetables limit fast food meals to no more than 1 per week manage portion size read food labels for fat, fiber, carbohydrates and portion size wash and dry feet carefully every day wear comfortable, cotton socks wear comfortable, well-fitting shoes Keep up your walking routine Always talk to your pharmacist at primary care provider office if you are unable to afford a medication or need  samples  Follow Up Plan: Telephone follow up appointment with care management team member scheduled for:  07/18/22 at 9 am        CCM (HYPERTENSION) EXPECTED OUTCOME: MONITOR, SELF-MANAGE AND REDUCE SYMPTOMS OF HYPERTENSION       Current Barriers:  Knowledge Deficits related to Hypertension management Chronic Disease Management support and education needs  related to Hypertension, diet No Advanced Directives in place- documents previously mailed Patient reports he lives with spouse, is independent with all aspects of his care, continues to drive, recently retired from [employer]. Patient reports he has blood pressure cuff but does not check at home, BP is checked at provider appointments and [employer]. Patient reports he is trying to walk for exercise several times per week  Planned Interventions: Evaluation of current treatment plan related to hypertension self management and patient's adherence to plan as established by provider;   Reviewed prescribed diet low sodium Reviewed medications with patient and discussed importance of compliance;  Counseled on the importance of exercise goals with target of 150 minutes per week Discussed plans with patient for ongoing care management follow up and provided patient with direct contact information for care management team; Advised patient, providing education and rationale, to monitor blood pressure daily and record, calling PCP for findings outside established parameters;  Advised patient to discuss any issues with blood pressure, medications, change in health status with provider; Provided education on prescribed diet low sodium;  Discussed complications of poorly controlled blood pressure such as heart disease, stroke, circulatory complications, vision complications, kidney impairment, sexual dysfunction;   Symptom Management: Take medications as prescribed   Attend all scheduled provider appointments Call pharmacy for medication refills 3-7 days in advance of running out of medications Attend church or other social activities Perform all self care activities independently  Perform IADL's (shopping, preparing meals, housekeeping, managing finances) independently Call provider office for new concerns or questions  check blood pressure weekly choose a place to take my blood pressure (home, clinic or  office, retail store) write blood pressure results in a log or diary learn about high blood pressure keep a blood pressure log take blood pressure log to all doctor appointments call doctor for signs and symptoms of high blood pressure keep all doctor appointments take medications for blood pressure exactly as prescribed report new symptoms to your doctor eat more whole grains, fruits and vegetables, lean meats and healthy fats Follow low sodium diet- read food labels  Follow Up Plan: Telephone follow up appointment with care management team member scheduled for:  5/823 at 9 am          Plan:Telephone follow up appointment with care management team member scheduled for:  07/18/22 at 9 am  Jacqlyn Larsen [facility], BSN RN Case Manager [place] [phone]

## 2022-05-10 DIAGNOSIS — E1159 Type 2 diabetes mellitus with other circulatory complications: Secondary | ICD-10-CM | POA: Diagnosis not present

## 2022-05-10 DIAGNOSIS — I10 Essential (primary) hypertension: Secondary | ICD-10-CM | POA: Diagnosis not present

## 2022-05-10 DIAGNOSIS — R0681 Apnea, not elsewhere classified: Secondary | ICD-10-CM | POA: Diagnosis not present

## 2022-05-10 DIAGNOSIS — G4733 Obstructive sleep apnea (adult) (pediatric): Secondary | ICD-10-CM | POA: Diagnosis not present

## 2022-06-09 DIAGNOSIS — G4733 Obstructive sleep apnea (adult) (pediatric): Secondary | ICD-10-CM | POA: Diagnosis not present

## 2022-06-09 DIAGNOSIS — R0681 Apnea, not elsewhere classified: Secondary | ICD-10-CM | POA: Diagnosis not present

## 2022-07-10 DIAGNOSIS — G4733 Obstructive sleep apnea (adult) (pediatric): Secondary | ICD-10-CM | POA: Diagnosis not present

## 2022-07-10 DIAGNOSIS — R0681 Apnea, not elsewhere classified: Secondary | ICD-10-CM | POA: Diagnosis not present

## 2022-07-12 ENCOUNTER — Telehealth: Payer: Self-pay

## 2022-07-12 NOTE — Telephone Encounter (Signed)
Correction 2mg  dose pens delivered 07/04/22   Disregard!

## 2022-07-12 NOTE — Telephone Encounter (Signed)
Rec'd refill form from [facility] for patients Ozempic medication.   Is patient ready for 2mg  dose increase?

## 2022-07-18 ENCOUNTER — Ambulatory Visit (INDEPENDENT_AMBULATORY_CARE_PROVIDER_SITE_OTHER): Payer: Medicare Other | Admitting: *Deleted

## 2022-07-18 DIAGNOSIS — E1165 Type 2 diabetes mellitus with hyperglycemia: Secondary | ICD-10-CM

## 2022-07-18 DIAGNOSIS — I10 Essential (primary) hypertension: Secondary | ICD-10-CM

## 2022-07-18 NOTE — Chronic Care Management (AMB) (Signed)
Chronic Care Management   CCM RN Visit Note  07/18/2022 Name: Scott [name] MRN: [id number] DOB: [birth year]  Subjective: Scott [name] is a 67 y.o. year old male who is a primary care patient of [name], DO. The patient was referred to the Chronic Care Management team for assistance with care management needs subsequent to provider initiation of CCM services and plan of care.    Today's Visit:  Engaged with patient by telephone for follow up visit.        Goals Addressed             This Visit's Progress    CCM (DIABETES) EXPECTED OUTCOME:  MONITOR, SELF-MANAGE AND REDUCE SYMPTOMS OF DIABETES       Current Barriers:  Knowledge Deficits related to Diabetes management Chronic Disease Management support and education needs related to Diabetes, diet No Advanced Directives in place- documents mailed Patient reports he checks CBG 1-2 x per week with fasting ranges usually in low 100's with today's reading 130's with today's reading 136. Patient states he used to have [employer] and did well with this but due to not being on insulin the costs was too high, states trying to eat healthy and be mindful of carbohydrate intake, pt is currently working with [facility] pharmacist, states he has ozempic and all medications and taking as prescribed. Patient reports he walks 2 x week, recently retired from work. Patient has chronic shoulder pain but states it is under better control, no new issues reported today.  Planned Interventions: Reviewed medications with patient and discussed importance of medication adherence;        Counseled on importance of regular laboratory monitoring as prescribed;        Provided patient with written educational materials related to hypo and hyperglycemia and importance of correct treatment;       Advised patient, providing education and rationale, to check cbg when you have symptoms of low or high blood sugar and per doctor's order/ recommendation   and record        call provider for findings outside established parameters;       Review of patient status, including review of consultants reports, relevant laboratory and other test results, and medications completed;       Pain assessment completed Reinforced importance of exercise and getting outside daily Reinforced carbohydrate modified diet and food choices  Symptom Management: Take medications as prescribed   Attend all scheduled provider appointments Call pharmacy for medication refills 3-7 days in advance of running out of medications Attend church or other social activities Perform all self care activities independently  Perform IADL's (shopping, preparing meals, housekeeping, managing finances) independently Call provider office for new concerns or questions  check blood sugar at prescribed times: when you have symptoms of low or high blood sugar and per doctor's order/ recommendation  check feet daily for cuts, sores or redness enter blood sugar readings and medication or insulin into daily log take the blood sugar log to all doctor visits take the blood sugar meter to all doctor visits set goal weight trim toenails straight across fill half of plate with vegetables limit fast food meals to no more than 1 per week manage portion size read food labels for fat, fiber, carbohydrates and portion size wash and dry feet carefully every day wear comfortable, cotton socks wear comfortable, well-fitting shoes Keep up your walking routine Always talk to your pharmacist at primary care provider office if you are unable to afford a  medication or need samples  Follow Up Plan: Telephone follow up appointment with care management team member scheduled for:  10/22/22 at 9 am        CCM (HYPERTENSION) EXPECTED OUTCOME: MONITOR, SELF-MANAGE AND REDUCE SYMPTOMS OF HYPERTENSION       Current Barriers:  Knowledge Deficits related to Hypertension management Chronic Disease Management  support and education needs related to Hypertension, diet No Advanced Directives in place- documents previously mailed Patient reports he lives with spouse, is independent with all aspects of his care, continues to drive, recently retired from plumbing. Patient reports he has blood pressure cuff but does not check at home, BP is checked at provider appointments and [employer]. Patient reports he is trying to walk for exercise several times per week, trying to eat as healthy as possible  Planned Interventions: Evaluation of current treatment plan related to hypertension self management and patient's adherence to plan as established by provider;   Reviewed medications with patient and discussed importance of compliance;  Counseled on the importance of exercise goals with target of 150 minutes per week Discussed plans with patient for ongoing care management follow up and provided patient with direct contact information for care management team; Advised patient, providing education and rationale, to monitor blood pressure daily and record, calling PCP for findings outside established parameters;  Advised patient to discuss any issues with blood pressure, medications, change in health status with provider; Discussed complications of poorly controlled blood pressure such as heart disease, stroke, circulatory complications, vision complications, kidney impairment, sexual dysfunction;  Reinforced low sodium diet, importance of reading food labels  Symptom Management: Take medications as prescribed   Attend all scheduled provider appointments Call pharmacy for medication refills 3-7 days in advance of running out of medications Attend church or other social activities Perform all self care activities independently  Perform IADL's (shopping, preparing meals, housekeeping, managing finances) independently Call provider office for new concerns or questions  check blood pressure weekly choose a place to  take my blood pressure (home, clinic or office, retail store) write blood pressure results in a log or diary learn about high blood pressure keep a blood pressure log take blood pressure log to all doctor appointments call doctor for signs and symptoms of high blood pressure keep all doctor appointments take medications for blood pressure exactly as prescribed report new symptoms to your doctor eat more whole grains, fruits and vegetables, lean meats and healthy fats Follow low sodium diet- read food labels for sodium content Limit fast food  Follow Up Plan: Telephone follow up appointment with care management team member scheduled for:  10/22/22 at 9 am          Plan:Telephone follow up appointment with care management team member scheduled for:  10/22/22 at 9 am  Irving Shows [facility], BSN RN Case Manager [facility] [phone]

## 2022-07-18 NOTE — Patient Instructions (Signed)
Please call the care guide team at [phone] if you need to cancel or reschedule your appointment.   If you are experiencing a Mental Health or Behavioral Health Crisis or need someone to talk to, please call the Suicide and Crisis Lifeline: 988 call the [place] National Suicide Prevention Lifeline: 610-857-9416 or TTY: 774-439-7567 TTY 252-422-2889) to talk to a trained counselor call 1-800-273-TALK (toll free, 24 hour hotline) go to [facility] [address], [place] [phone]) call the [facility]: [phone] call 911   Following is a copy of the CCM Program Consent:  CCM service includes personalized support from designated clinical staff supervised by the physician, including individualized plan of care and coordination with other care providers 24/7 contact phone numbers for assistance for urgent and routine care needs. Service will only be billed when office clinical staff spend 20 minutes or more in a month to coordinate care. Only one practitioner may furnish and bill the service in a calendar month. The patient may stop CCM services at amy time (effective at the end of the month) by phone call to the office staff. The patient will be responsible for cost sharing (co-pay) or up to 20% of the service fee (after annual deductible is met)  Following is a copy of your full provider care plan:   Goals Addressed             This Visit's Progress    CCM (DIABETES) EXPECTED OUTCOME:  MONITOR, SELF-MANAGE AND REDUCE SYMPTOMS OF DIABETES       Current Barriers:  Knowledge Deficits related to Diabetes management Chronic Disease Management support and education needs related to Diabetes, diet No Advanced Directives in place- documents mailed Patient reports he checks CBG 1-2 x per week with fasting ranges usually in low 100's with today's reading 130's with today's reading 136. Patient states he used to have [employer]  and did well with this but due to not being on insulin the costs was too high, states trying to eat healthy and be mindful of carbohydrate intake, pt is currently working with [facility] pharmacist, states he has ozempic and all medications and taking as prescribed. Patient reports he walks 2 x week, recently retired from work. Patient has chronic shoulder pain but states it is under better control, no new issues reported today.  Planned Interventions: Reviewed medications with patient and discussed importance of medication adherence;        Counseled on importance of regular laboratory monitoring as prescribed;        Provided patient with written educational materials related to hypo and hyperglycemia and importance of correct treatment;       Advised patient, providing education and rationale, to check cbg when you have symptoms of low or high blood sugar and per doctor's order/ recommendation  and record        call provider for findings outside established parameters;       Review of patient status, including review of consultants reports, relevant laboratory and other test results, and medications completed;       Pain assessment completed Reinforced importance of exercise and getting outside daily Reinforced carbohydrate modified diet and food choices  Symptom Management: Take medications as prescribed   Attend all scheduled provider appointments Call pharmacy for medication refills 3-7 days in advance of running out of medications Attend church or other social activities Perform all self care activities independently  Perform IADL's (shopping, preparing meals, housekeeping, managing finances) independently  Call provider office for new concerns or questions  check blood sugar at prescribed times: when you have symptoms of low or high blood sugar and per doctor's order/ recommendation  check feet daily for cuts, sores or redness enter blood sugar readings and medication or insulin into daily  log take the blood sugar log to all doctor visits take the blood sugar meter to all doctor visits set goal weight trim toenails straight across fill half of plate with vegetables limit fast food meals to no more than 1 per week manage portion size read food labels for fat, fiber, carbohydrates and portion size wash and dry feet carefully every day wear comfortable, cotton socks wear comfortable, well-fitting shoes Keep up your walking routine Always talk to your pharmacist at primary care provider office if you are unable to afford a medication or need samples  Follow Up Plan: Telephone follow up appointment with care management team member scheduled for:  10/22/22 at 9 am        CCM (HYPERTENSION) EXPECTED OUTCOME: MONITOR, SELF-MANAGE AND REDUCE SYMPTOMS OF HYPERTENSION       Current Barriers:  Knowledge Deficits related to Hypertension management Chronic Disease Management support and education needs related to Hypertension, diet No Advanced Directives in place- documents previously mailed Patient reports he lives with spouse, is independent with all aspects of his care, continues to drive, recently retired from plumbing. Patient reports he has blood pressure cuff but does not check at home, BP is checked at provider appointments and [employer]. Patient reports he is trying to walk for exercise several times per week, trying to eat as healthy as possible  Planned Interventions: Evaluation of current treatment plan related to hypertension self management and patient's adherence to plan as established by provider;   Reviewed medications with patient and discussed importance of compliance;  Counseled on the importance of exercise goals with target of 150 minutes per week Discussed plans with patient for ongoing care management follow up and provided patient with direct contact information for care management team; Advised patient, providing education and rationale, to monitor blood  pressure daily and record, calling PCP for findings outside established parameters;  Advised patient to discuss any issues with blood pressure, medications, change in health status with provider; Discussed complications of poorly controlled blood pressure such as heart disease, stroke, circulatory complications, vision complications, kidney impairment, sexual dysfunction;  Reinforced low sodium diet, importance of reading food labels  Symptom Management: Take medications as prescribed   Attend all scheduled provider appointments Call pharmacy for medication refills 3-7 days in advance of running out of medications Attend church or other social activities Perform all self care activities independently  Perform IADL's (shopping, preparing meals, housekeeping, managing finances) independently Call provider office for new concerns or questions  check blood pressure weekly choose a place to take my blood pressure (home, clinic or office, retail store) write blood pressure results in a log or diary learn about high blood pressure keep a blood pressure log take blood pressure log to all doctor appointments call doctor for signs and symptoms of high blood pressure keep all doctor appointments take medications for blood pressure exactly as prescribed report new symptoms to your doctor eat more whole grains, fruits and vegetables, lean meats and healthy fats Follow low sodium diet- read food labels for sodium content Limit fast food  Follow Up Plan: Telephone follow up appointment with care management team member scheduled for:  10/22/22 at 9 am  Patient verbalizes understanding of instructions and care plan provided today and agrees to view in MyChart. Active MyChart status and patient understanding of how to access instructions and care plan via MyChart confirmed with patient.  Telephone follow up appointment with care management team member scheduled for:  10/22/22 at 9 am

## 2022-07-19 ENCOUNTER — Other Ambulatory Visit: Payer: Self-pay | Admitting: Family Medicine

## 2022-07-27 ENCOUNTER — Encounter: Payer: Self-pay | Admitting: Family Medicine

## 2022-07-27 ENCOUNTER — Other Ambulatory Visit: Payer: Self-pay | Admitting: Family Medicine

## 2022-07-27 ENCOUNTER — Telehealth (INDEPENDENT_AMBULATORY_CARE_PROVIDER_SITE_OTHER): Payer: Medicare Other | Admitting: Family Medicine

## 2022-07-27 ENCOUNTER — Other Ambulatory Visit: Payer: Self-pay

## 2022-07-27 VITALS — BP 114/63 | HR 75 | Wt 206.0 lb

## 2022-07-27 DIAGNOSIS — I152 Hypertension secondary to endocrine disorders: Secondary | ICD-10-CM | POA: Diagnosis not present

## 2022-07-27 DIAGNOSIS — N400 Enlarged prostate without lower urinary tract symptoms: Secondary | ICD-10-CM | POA: Diagnosis not present

## 2022-07-27 DIAGNOSIS — E1159 Type 2 diabetes mellitus with other circulatory complications: Secondary | ICD-10-CM | POA: Diagnosis not present

## 2022-07-27 DIAGNOSIS — E1169 Type 2 diabetes mellitus with other specified complication: Secondary | ICD-10-CM

## 2022-07-27 DIAGNOSIS — E785 Hyperlipidemia, unspecified: Secondary | ICD-10-CM

## 2022-07-27 DIAGNOSIS — E1165 Type 2 diabetes mellitus with hyperglycemia: Secondary | ICD-10-CM

## 2022-07-27 LAB — BAYER DCA HB A1C WAIVED: HB A1C (BAYER DCA - WAIVED): 6.4 % — ABNORMAL HIGH (ref 4.8–5.6)

## 2022-07-27 MED ORDER — SEMAGLUTIDE (2 MG/DOSE) 8 MG/3ML ~~LOC~~ SOPN: 2.0000 mg | PEN_INJECTOR | SUBCUTANEOUS | 0 refills | Status: DC

## 2022-07-27 MED ORDER — ROSUVASTATIN CALCIUM 10 MG PO TABS: 10.0000 mg | ORAL_TABLET | Freq: Every day | ORAL | 1 refills | Status: DC

## 2022-07-27 MED ORDER — METFORMIN HCL 500 MG PO TABS: 1000.0000 mg | ORAL_TABLET | Freq: Two times a day (BID) | ORAL | 1 refills | Status: DC

## 2022-07-27 MED ORDER — LISINOPRIL 5 MG PO TABS: 5.0000 mg | ORAL_TABLET | Freq: Every day | ORAL | 1 refills | Status: DC

## 2022-07-27 MED ORDER — OMEPRAZOLE 40 MG PO CPDR: 40.0000 mg | DELAYED_RELEASE_CAPSULE | Freq: Every day | ORAL | 1 refills | Status: DC

## 2022-07-27 NOTE — Progress Notes (Signed)
MyChart Video visit  Subjective: CC:Dm PCP: [name], DO BJY:NWGNFA [place] is a 67 y.o. male. Patient provides verbal consent for consult held via video.  Due to COVID-19 pandemic this visit was conducted virtually. This visit type was conducted due to national recommendations for restrictions regarding the COVID-19 Pandemic (e.g. social distancing, sheltering in place) in an effort to limit this patient's exposure and mitigate transmission in our community. All issues noted in this document were discussed and addressed.  A physical exam was not performed with this format.   Location of patient: home Location of provider: [facility] Others present for call: none  1. DM with hypertension and hyperlipidemia Reports BGs have been running 120-150s.  Patient is compliant with all medications.  Needs refills.  Has not yet set up diabetic eye exam but will schedule.  No chest pain, shortness of breath, blurred vision, genital symptoms.  Continues to work with clinical pharmacy for patient assistance program and has a telephone visit scheduled for this fall   ROS: Per HPI  Allergies  Allergen Reactions   Augmentin [Amoxicillin-Pot Clavulanate] Itching and Nausea And Vomiting   Codeine Itching and Nausea And Vomiting   Penicillins Hives and Itching    Has patient had a PCN reaction causing immediate rash, facial/tongue/throat swelling, SOB or lightheadedness with hypotension: No Has patient had a PCN reaction causing severe rash involving mucus membranes or skin necrosis: No Has patient had a PCN reaction that required hospitalization No Has patient had a PCN reaction occurring within the last 10 years: No If all of the above answers are "NO", then may proceed with Cephalosporin use.    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HCC)     Asthma    Diabetes mellitus without complication (HCC)    Dyslipidemia    GERD (gastroesophageal reflux disease)    Numbness    right side of tongue since 2016 surgery   Sleep apnea    uses cpap   Tobacco abuse     Current Outpatient Medications:    albuterol (VENTOLIN HFA) 108 (90 Base) MCG/ACT inhaler, Inhale 2 puffs into the lungs every 6 (six) hours as needed for wheezing., Disp: 1 each, Rfl: 3   Ascorbic Acid (VITAMIN C) 1000 MG tablet, Take 1,000 mg by mouth daily., Disp: , Rfl:    aspirin 81 MG tablet, Take 1 tablet (81 mg total) by mouth daily., Disp: 30 tablet, Rfl:    azithromycin (ZITHROMAX Z-PAK) 250 MG tablet, As directed (Patient not taking: Reported on 07/18/2022), Disp: 6 tablet, Rfl: 0   budesonide-formoterol (SYMBICORT) 160-4.5 MCG/ACT inhaler, Inhale 2 puffs into the lungs 2 (two) times daily., Disp: 30.6 g, Rfl: 12   Cholecalciferol (VITAMIN D) 50 MCG (2000 UT) CAPS, Take 2,000 Units by mouth daily., Disp: , Rfl:    cycloSPORINE (RESTASIS) 0.05 % ophthalmic emulsion, Place 1 drop into both eyes 2 (two) times daily. , Disp: , Rfl:    dapagliflozin propanediol (FARXIGA) 10 MG TABS tablet, Take 1 tablet (10 mg total) by mouth daily before breakfast., Disp: 90 tablet, Rfl: 4   diclofenac (VOLTAREN) 75 MG EC tablet, Take 1 tablet (75 mg total) by mouth 2 (two) times daily., Disp: 30 tablet, Rfl: 0   diclofenac sodium (VOLTAREN) 1 % GEL, Apply 4 g topically  4 (four) times daily., Disp: 350 g, Rfl: 1   lisinopril (ZESTRIL) 5 MG tablet, TAKE ONE (1) TABLET EACH DAY, Disp: 90 tablet, Rfl: 1   loratadine (CLARITIN) 10 MG tablet, Take 10 mg by mouth daily as needed for allergies., Disp: , Rfl:    metFORMIN (GLUCOPHAGE) 500 MG tablet, Take 2 tablets (1,000 mg total) by mouth 2 (two) times daily. (NEEDS TO BE SEEN BEFORE NEXT REFILL), Disp: 120 tablet, Rfl: 0   naproxen sodium (ALEVE) 220 MG tablet, Take 440 mg by mouth 2 (two) times daily as needed (pain)., Disp: , Rfl:    omeprazole  (PRILOSEC) 40 MG capsule, Take 40 mg by mouth daily., Disp: , Rfl:    rosuvastatin (CRESTOR) 10 MG tablet, TAKE ONE (1) TABLET EACH DAY, Disp: 90 tablet, Rfl: 1   Semaglutide,0.25 or 0.5MG /DOS, 2 MG/3ML SOPN, Inject 0.5mg  into the skin weekly for 4 weeks, then increase to 1mg  into skin weekly (via [employer] patient assistance program), Disp: 3 mL, Rfl: 5   vitamin E 180 MG (400 UNITS) capsule, Take 800 Units by mouth daily., Disp: , Rfl:   Gen: well appearing male/ NAD HEENT: sclera white, MMM  Assessment/ Plan: 67 y.o. male   Uncontrolled type 2 diabetes mellitus with hyperglycemia (HCC) - Plan: Bayer DCA Hb A1c Waived, Lipid panel, CMP14+EGFR, metFORMIN (GLUCOPHAGE) 500 MG tablet  Benign prostatic hyperplasia without lower urinary tract symptoms - Plan: PSA  Hyperlipidemia associated with type 2 diabetes mellitus (HCC) - Plan: Lipid panel, CMP14+EGFR, rosuvastatin (CRESTOR) 10 MG tablet  Hypertension associated with diabetes (HCC) - Plan: lisinopril (ZESTRIL) 5 MG tablet  Labs have been ordered.  He will come in today to have these drawn.  Medications have been renewed.  44-month follow-up scheduled.  Patient aware of date and time  Start time: 1:34pm End time: 1:45pm  Total time spent on patient care (including video visit/ documentation): 15 minutes  Burnett Lieber Hulen Skains, DO [facility] [phone]

## 2022-07-27 NOTE — Telephone Encounter (Signed)
[place] pt NTBS 30-d given 07/19/22

## 2022-07-27 NOTE — Telephone Encounter (Signed)
LEFT MESSAGE FOR PT TO CALL BACK WITH APPT AND I WILL MAIL LETTER

## 2022-07-28 LAB — CMP14+EGFR
ALT: 21 IU/L (ref 0–44)
AST: 21 IU/L (ref 0–40)
Albumin/Globulin Ratio: 2 (ref 1.2–2.2)
Albumin: 4.5 g/dL (ref 3.9–4.9)
Alkaline Phosphatase: 60 IU/L (ref 44–121)
BUN/Creatinine Ratio: 18 (ref 10–24)
BUN: 17 mg/dL (ref 8–27)
Bilirubin Total: 0.7 mg/dL (ref 0.0–1.2)
CO2: 22 mmol/L (ref 20–29)
Calcium: 9.3 mg/dL (ref 8.6–10.2)
Chloride: 101 mmol/L (ref 96–106)
Creatinine, Ser: 0.96 mg/dL (ref 0.76–1.27)
Globulin, Total: 2.3 g/dL (ref 1.5–4.5)
Glucose: 105 mg/dL — ABNORMAL HIGH (ref 70–99)
Potassium: 4.6 mmol/L (ref 3.5–5.2)
Sodium: 138 mmol/L (ref 134–144)
Total Protein: 6.8 g/dL (ref 6.0–8.5)
eGFR: 87 mL/min/{1.73_m2} (ref >59)

## 2022-07-28 LAB — LIPID PANEL
Chol/HDL Ratio: 2.3 ratio (ref 0.0–5.0)
Cholesterol, Total: 117 mg/dL (ref 100–199)
HDL: 51 mg/dL (ref >39)
LDL Chol Calc (NIH): 50 mg/dL (ref 0–99)
Triglycerides: 77 mg/dL (ref 0–149)
VLDL Cholesterol Cal: 16 mg/dL (ref 5–40)

## 2022-07-28 LAB — PSA: Prostate Specific Ag, Serum: 0.5 ng/mL (ref 0.0–4.0)

## 2022-08-09 DIAGNOSIS — G4733 Obstructive sleep apnea (adult) (pediatric): Secondary | ICD-10-CM | POA: Diagnosis not present

## 2022-08-09 DIAGNOSIS — R0681 Apnea, not elsewhere classified: Secondary | ICD-10-CM | POA: Diagnosis not present

## 2022-08-10 DIAGNOSIS — E1159 Type 2 diabetes mellitus with other circulatory complications: Secondary | ICD-10-CM | POA: Diagnosis not present

## 2022-08-10 DIAGNOSIS — I10 Essential (primary) hypertension: Secondary | ICD-10-CM

## 2022-08-10 DIAGNOSIS — Z7984 Long term (current) use of oral hypoglycemic drugs: Secondary | ICD-10-CM | POA: Diagnosis not present

## 2022-08-29 ENCOUNTER — Ambulatory Visit (INDEPENDENT_AMBULATORY_CARE_PROVIDER_SITE_OTHER): Payer: Medicare Other

## 2022-08-29 VITALS — Ht 71.0 in | Wt 205.0 lb

## 2022-08-29 DIAGNOSIS — Z0000 Encounter for general adult medical examination without abnormal findings: Secondary | ICD-10-CM

## 2022-08-29 NOTE — Patient Instructions (Signed)
Mr. Scott [name] , Thank you for taking time to come for your Medicare Wellness Visit. I appreciate your ongoing commitment to your health goals. Please review the following plan we discussed and let me know if I can assist you in the future.   These are the goals we discussed:  Goals       CCM (DIABETES) EXPECTED OUTCOME:  MONITOR, SELF-MANAGE AND REDUCE SYMPTOMS OF DIABETES      Current Barriers:  Knowledge Deficits related to Diabetes management Chronic Disease Management support and education needs related to Diabetes, diet No Advanced Directives in place- documents mailed Patient reports he checks CBG 1-2 x per week with fasting ranges usually in low 100's with today's reading 130's with today's reading 136. Patient states he used to have [employer] and did well with this but due to not being on insulin the costs was too high, states trying to eat healthy and be mindful of carbohydrate intake, pt is currently working with [facility] pharmacist, states he has ozempic and all medications and taking as prescribed. Patient reports he walks 2 x week, recently retired from work. Patient has chronic shoulder pain but states it is under better control, no new issues reported today.  Planned Interventions: Reviewed medications with patient and discussed importance of medication adherence;        Counseled on importance of regular laboratory monitoring as prescribed;        Provided patient with written educational materials related to hypo and hyperglycemia and importance of correct treatment;       Advised patient, providing education and rationale, to check cbg when you have symptoms of low or high blood sugar and per doctor's order/ recommendation  and record        call provider for findings outside established parameters;       Review of patient status, including review of consultants reports, relevant laboratory and other test results, and medications completed;       Pain assessment  completed Reinforced importance of exercise and getting outside daily Reinforced carbohydrate modified diet and food choices  Symptom Management: Take medications as prescribed   Attend all scheduled provider appointments Call pharmacy for medication refills 3-7 days in advance of running out of medications Attend church or other social activities Perform all self care activities independently  Perform IADL's (shopping, preparing meals, housekeeping, managing finances) independently Call provider office for new concerns or questions  check blood sugar at prescribed times: when you have symptoms of low or high blood sugar and per doctor's order/ recommendation  check feet daily for cuts, sores or redness enter blood sugar readings and medication or insulin into daily log take the blood sugar log to all doctor visits take the blood sugar meter to all doctor visits set goal weight trim toenails straight across fill half of plate with vegetables limit fast food meals to no more than 1 per week manage portion size read food labels for fat, fiber, carbohydrates and portion size wash and dry feet carefully every day wear comfortable, cotton socks wear comfortable, well-fitting shoes Keep up your walking routine Always talk to your pharmacist at primary care provider office if you are unable to afford a medication or need samples  Follow Up Plan: Telephone follow up appointment with care management team member scheduled for:  10/22/22 at 9 am         CCM (HYPERTENSION) EXPECTED OUTCOME: MONITOR, SELF-MANAGE AND REDUCE SYMPTOMS OF HYPERTENSION      Current Barriers:  Knowledge Deficits related to Hypertension management Chronic Disease Management support and education needs related to Hypertension, diet No Advanced Directives in place- documents previously mailed Patient reports he lives with spouse, is independent with all aspects of his care, continues to drive, recently retired from  plumbing. Patient reports he has blood pressure cuff but does not check at home, BP is checked at provider appointments and [employer]. Patient reports he is trying to walk for exercise several times per week, trying to eat as healthy as possible  Planned Interventions: Evaluation of current treatment plan related to hypertension self management and patient's adherence to plan as established by provider;   Reviewed medications with patient and discussed importance of compliance;  Counseled on the importance of exercise goals with target of 150 minutes per week Discussed plans with patient for ongoing care management follow up and provided patient with direct contact information for care management team; Advised patient, providing education and rationale, to monitor blood pressure daily and record, calling PCP for findings outside established parameters;  Advised patient to discuss any issues with blood pressure, medications, change in health status with provider; Discussed complications of poorly controlled blood pressure such as heart disease, stroke, circulatory complications, vision complications, kidney impairment, sexual dysfunction;  Reinforced low sodium diet, importance of reading food labels  Symptom Management: Take medications as prescribed   Attend all scheduled provider appointments Call pharmacy for medication refills 3-7 days in advance of running out of medications Attend church or other social activities Perform all self care activities independently  Perform IADL's (shopping, preparing meals, housekeeping, managing finances) independently Call provider office for new concerns or questions  check blood pressure weekly choose a place to take my blood pressure (home, clinic or office, retail store) write blood pressure results in a log or diary learn about high blood pressure keep a blood pressure log take blood pressure log to all doctor appointments call doctor for signs and  symptoms of high blood pressure keep all doctor appointments take medications for blood pressure exactly as prescribed report new symptoms to your doctor eat more whole grains, fruits and vegetables, lean meats and healthy fats Follow low sodium diet- read food labels for sodium content Limit fast food  Follow Up Plan: Telephone follow up appointment with care management team member scheduled for:  10/22/22 at 9 am        Exercise 3x per week (30 min per time)      Continue to exercise and stay healthy.      T2DM, HLD PHARMD GOAL (pt-stated)      =Current Barriers:  Unable to independently afford treatment regimen Unable to maintain control of T2DM, HTN  Pharmacist Clinical Goal(s):  patient will verbalize ability to afford treatment regimen maintain control of T2DM  as evidenced by GOAL A1C  through collaboration with PharmD and provider.    Interventions: 1:1 collaboration with [name], DO regarding development and update of comprehensive plan of care as evidenced by provider attestation and co-signature Inter-disciplinary care team collaboration (see longitudinal plan of care) Comprehensive medication review performed; medication list updated in electronic medical record  Diabetes: Goal on track: NO.  Uncontrolled-A1c increased to 8.1%7.6-->7.3% (improved), GFR 92-->95 Current treatment: RYBELSUS 14mg  daily-->Ozempic 0.5mg  sq weekly, FARXIGA 10mg  daily, METFORMIN max;  Patient transitioned to Ozempic 0.5mg  from Rybelsus 14mg  daily -- additional glycemic control is needed Instructed patient to continue Ozemipc 0.5mg  weekly x4 weeks then we will increase to 1mg  sq weekly (when patient assistance supply  is here) Dose change forms submitted to [facility] P[facility] for Ozempic 1mg  weekly Patient continues to make healthier food choices, increased water intake, increased walking  Will continue to work on diet/lifestyle; interested in [place], but not covered under insurance  (not on insulin/no hypoglycemia) Denies personal and family history of Medullary thyroid cancer (MTC) Current glucose readings: fasting glucose: <150, post prandial glucose: >180- Discussed meal planning options and Plate method for healthy eating Avoid sugary drinks and desserts Incorporate balanced protein, non starchy veggies, 1 serving of carbohydrate with each meal Increase water intake Increase physical activity as able Current exercise: n/a, encouraged LDL AT GOAL--continue statin GFR STABLE Recommended ozempic titration Assessed patient finances. Will re-enroll for  az&me for  farxiga. Dose change submitted to  [facility] for Ozemipc  Patient Goals/Self-Care Activities patient will:  - take medications as prescribed as evidenced by patient report and record review collaborate with provider on medication access solutions target a minimum of 150 minutes of moderate intensity exercise weekly engage in dietary modifications by FOLLOWING A HEART HEALTHY DIET/HEALTHY PLATE METHOD          This is a list of the screening recommended for you and due dates:  Health Maintenance  Topic Date Due   COVID-19 Vaccine (2 - 2023-24 season) 11/10/2021   Eye exam for diabetics  03/14/2022   Complete foot exam   09/05/2022   Flu Shot  10/11/2022   Yearly kidney health urinalysis for diabetes  01/11/2023   Hemoglobin A1C  01/27/2023   Yearly kidney function blood test for diabetes  07/27/2023   Medicare Annual Wellness Visit  08/29/2023   DTaP/Tdap/Td vaccine (2 - Td or Tdap) 12/29/2024   Colon Cancer Screening  02/27/2028   Pneumonia Vaccine  Completed   Hepatitis C Screening  Completed   Zoster (Shingles) Vaccine  Completed   HPV Vaccine  Aged Out    Advanced directives: .ahad  Conditions/risks identified: Aim for 30 minutes of exercise or brisk walking, 6-8 glasses of water, and 5 servings of fruits and vegetables each day.   Next appointment: Follow up in one year for your  annual wellness visit.   Preventive Care 38 Years and Older, Male  Preventive care refers to lifestyle choices and visits with your health care provider that can promote health and wellness. What does preventive care include? A yearly physical exam. This is also called an annual well check. Dental exams once or twice a year. Routine eye exams. Ask your health care provider how often you should have your eyes checked. Personal lifestyle choices, including: Daily care of your teeth and gums. Regular physical activity. Eating a healthy diet. Avoiding tobacco and drug use. Limiting alcohol use. Practicing safe sex. Taking low doses of aspirin every day. Taking vitamin and mineral supplements as recommended by your health care provider. What happens during an annual well check? The services and screenings done by your health care provider during your annual well check will depend on your age, overall health, lifestyle risk factors, and family history of disease. Counseling  Your health care provider may ask you questions about your: Alcohol use. Tobacco use. Drug use. Emotional well-being. Home and relationship well-being. Sexual activity. Eating habits. History of falls. Memory and ability to understand (cognition). Work and work Astronomer. Screening  You may have the following tests or measurements: Height, weight, and BMI. Blood pressure. Lipid and cholesterol levels. These may be checked every 5 years, or more frequently if you are over 50  years old. Skin check. Lung cancer screening. You may have this screening every year starting at age 54 if you have a 30-pack-year history of smoking and currently smoke or have quit within the past 15 years. Fecal occult blood test (FOBT) of the stool. You may have this test every year starting at age 71. Flexible sigmoidoscopy or colonoscopy. You may have a sigmoidoscopy every 5 years or a colonoscopy every 10 years starting at age  48. Prostate cancer screening. Recommendations will vary depending on your family history and other risks. Hepatitis C blood test. Hepatitis B blood test. Sexually transmitted disease (STD) testing. Diabetes screening. This is done by checking your blood sugar (glucose) after you have not eaten for a while (fasting). You may have this done every 1-3 years. Abdominal aortic aneurysm (AAA) screening. You may need this if you are a current or former smoker. Osteoporosis. You may be screened starting at age 33 if you are at high risk. Talk with your health care provider about your test results, treatment options, and if necessary, the need for more tests. Vaccines  Your health care provider may recommend certain vaccines, such as: Influenza vaccine. This is recommended every year. Tetanus, diphtheria, and acellular pertussis (Tdap, Td) vaccine. You may need a Td booster every 10 years. Zoster vaccine. You may need this after age 84. Pneumococcal 13-valent conjugate (PCV13) vaccine. One dose is recommended after age 56. Pneumococcal polysaccharide (PPSV23) vaccine. One dose is recommended after age 7. Talk to your health care provider about which screenings and vaccines you need and how often you need them. This information is not intended to replace advice given to you by your health care provider. Make sure you discuss any questions you have with your health care provider. Document Released: 03/25/2015 Document Revised: 11/16/2015 Document Reviewed: 12/28/2014 Elsevier Interactive Patient Education  2017 [employer].  Fall Prevention in the Home Falls can cause injuries. They can happen to people of all ages. There are many things you can do to make your home safe and to help prevent falls. What can I do on the outside of my home? Regularly fix the edges of walkways and driveways and fix any cracks. Remove anything that might make you trip as you walk through a door, such as a raised step or  threshold. Trim any bushes or trees on the path to your home. Use bright outdoor lighting. Clear any walking paths of anything that might make someone trip, such as rocks or tools. Regularly check to see if handrails are loose or broken. Make sure that both sides of any steps have handrails. Any raised decks and porches should have guardrails on the edges. Have any leaves, snow, or ice cleared regularly. Use sand or salt on walking paths during winter. Clean up any spills in your garage right away. This includes oil or grease spills. What can I do in the bathroom? Use night lights. Install grab bars by the toilet and in the tub and shower. Do not use towel bars as grab bars. Use non-skid mats or decals in the tub or shower. If you need to sit down in the shower, use a plastic, non-slip stool. Keep the floor dry. Clean up any water that spills on the floor as soon as it happens. Remove soap buildup in the tub or shower regularly. Attach bath mats securely with double-sided non-slip rug tape. Do not have throw rugs and other things on the floor that can make you trip. What can I  do in the bedroom? Use night lights. Make sure that you have a light by your bed that is easy to reach. Do not use any sheets or blankets that are too big for your bed. They should not hang down onto the floor. Have a firm chair that has side arms. You can use this for support while you get dressed. Do not have throw rugs and other things on the floor that can make you trip. What can I do in the kitchen? Clean up any spills right away. Avoid walking on wet floors. Keep items that you use a lot in easy-to-reach places. If you need to reach something above you, use a strong step stool that has a grab bar. Keep electrical cords out of the way. Do not use floor polish or wax that makes floors slippery. If you must use wax, use non-skid floor wax. Do not have throw rugs and other things on the floor that can make you  trip. What can I do with my stairs? Do not leave any items on the stairs. Make sure that there are handrails on both sides of the stairs and use them. Fix handrails that are broken or loose. Make sure that handrails are as long as the stairways. Check any carpeting to make sure that it is firmly attached to the stairs. Fix any carpet that is loose or worn. Avoid having throw rugs at the top or bottom of the stairs. If you do have throw rugs, attach them to the floor with carpet tape. Make sure that you have a light switch at the top of the stairs and the bottom of the stairs. If you do not have them, ask someone to add them for you. What else can I do to help prevent falls? Wear shoes that: Do not have high heels. Have rubber bottoms. Are comfortable and fit you well. Are closed at the toe. Do not wear sandals. If you use a stepladder: Make sure that it is fully opened. Do not climb a closed stepladder. Make sure that both sides of the stepladder are locked into place. Ask someone to hold it for you, if possible. Clearly mark and make sure that you can see: Any grab bars or handrails. First and last steps. Where the edge of each step is. Use tools that help you move around (mobility aids) if they are needed. These include: Canes. Walkers. Scooters. Crutches. Turn on the lights when you go into a dark area. Replace any light bulbs as soon as they burn out. Set up your furniture so you have a clear path. Avoid moving your furniture around. If any of your floors are uneven, fix them. If there are any pets around you, be aware of where they are. Review your medicines with your doctor. Some medicines can make you feel dizzy. This can increase your chance of falling. Ask your doctor what other things that you can do to help prevent falls. This information is not intended to replace advice given to you by your health care provider. Make sure you discuss any questions you have with your  health care provider. Document Released: 12/23/2008 Document Revised: 08/04/2015 Document Reviewed: 04/02/2014 Elsevier Interactive Patient Education  2017 [employer].

## 2022-08-29 NOTE — Progress Notes (Signed)
Subjective:   Scott [name] is a 67 y.o. male who presents for Medicare Annual/Subsequent preventive examination.  Visit Complete: Virtual  I connected with  Scott [name] on 08/29/22 by a audio enabled telemedicine application and verified that I am speaking with the correct person using two identifiers.  Patient Location: Home  Provider Location: Home Office  I discussed the limitations of evaluation and management by telemedicine. The patient expressed understanding and agreed to proceed.  Patient Medicare AWV questionnaire was completed by the patient on 08/29/2022; I have confirmed that all information answered by patient is correct and no changes since this date.  Review of Systems    Nutrition Risk Assessment:  Has the patient had any N/V/D within the last 2 months?  No  Does the patient have any non-healing wounds?  No  Has the patient had any unintentional weight loss or weight gain?  No   Diabetes:  Is the patient diabetic?  Yes  If diabetic, was a CBG obtained today?  No  Did the patient bring in their glucometer from home?  No  How often do you monitor your CBG's? 2x day .   Financial Strains and Diabetes Management:  Are you having any financial strains with the device, your supplies or your medication? No .  Does the patient want to be seen by Chronic Care Management for management of their diabetes?  No  Would the patient like to be referred to a Nutritionist or for Diabetic Management?  No   Diabetic Exams:  Diabetic Eye Exam: Completed 08/2022 Diabetic Foot Exam: Overdue, Pt has been advised about the importance in completing this exam. Pt is scheduled for diabetic foot exam on next office visit .  Cardiac Risk Factors include: advanced age (>53men, >48 women);diabetes mellitus;male gender;dyslipidemia;hypertension     Objective:    Today's Vitals   08/29/22 1346  Weight: 205 lb (93 kg)  Height: 5\' 11"  (1.803 m)   Body mass index is 28.59  kg/m.     08/29/2022    1:49 PM 02/16/2022   10:06 AM 08/23/2021   10:45 AM 10/07/2020   11:16 AM 01/10/2016    5:57 AM 01/05/2016    1:41 PM 11/30/2015   12:34 AM  Advanced Directives  Does Patient Have a Medical Advance Directive? Yes No No No No No No  Type of Estate agent of [place];Living will        Copy of Healthcare Power of Attorney in Chart? No - copy requested        Would patient like information on creating a medical advance directive?  Yes (MAU/Ambulatory/Procedural Areas - Information given) No - Patient declined Yes (MAU/Ambulatory/Procedural Areas - Information given) No - patient declined information No - patient declined information Yes - Educational materials given    Current Medications (verified) Outpatient Encounter Medications as of 08/29/2022  Medication Sig   albuterol (VENTOLIN HFA) 108 (90 Base) MCG/ACT inhaler Inhale 2 puffs into the lungs every 6 (six) hours as needed for wheezing.   Ascorbic Acid (VITAMIN C) 1000 MG tablet Take 1,000 mg by mouth daily.   aspirin 81 MG tablet Take 1 tablet (81 mg total) by mouth daily.   budesonide-formoterol (SYMBICORT) 160-4.5 MCG/ACT inhaler Inhale 2 puffs into the lungs 2 (two) times daily.   Cholecalciferol (VITAMIN D) 50 MCG (2000 UT) CAPS Take 2,000 Units by mouth daily.   cycloSPORINE (RESTASIS) 0.05 % ophthalmic emulsion Place 1 drop into both eyes 2 (two) times daily.  dapagliflozin propanediol (FARXIGA) 10 MG TABS tablet Take 1 tablet (10 mg total) by mouth daily before breakfast.   diclofenac (VOLTAREN) 75 MG EC tablet Take 1 tablet (75 mg total) by mouth 2 (two) times daily.   diclofenac sodium (VOLTAREN) 1 % GEL Apply 4 g topically 4 (four) times daily.   lisinopril (ZESTRIL) 5 MG tablet Take 1 tablet (5 mg total) by mouth daily.   loratadine (CLARITIN) 10 MG tablet Take 10 mg by mouth daily as needed for allergies.   metFORMIN (GLUCOPHAGE) 500 MG tablet Take 2 tablets (1,000 mg total) by  mouth 2 (two) times daily.   naproxen sodium (ALEVE) 220 MG tablet Take 440 mg by mouth 2 (two) times daily as needed (pain).   omeprazole (PRILOSEC) 40 MG capsule Take 1 capsule (40 mg total) by mouth daily.   rosuvastatin (CRESTOR) 10 MG tablet Take 1 tablet (10 mg total) by mouth daily.   Semaglutide, 2 MG/DOSE, 8 MG/3ML SOPN Inject 2 mg as directed once a week. Via novo nordisk patient assistance program   vitamin E 180 MG (400 UNITS) capsule Take 800 Units by mouth daily.   No facility-administered encounter medications on file as of 08/29/2022.    Allergies (verified) Augmentin [amoxicillin-pot clavulanate], Codeine, Penicillins, Atorvastatin, and Oxycodone hcl   History: Past Medical History:  Diagnosis Date   Allergy    Anxiety    Arthritis    Asbestosis (HCC)    Asthma    Diabetes mellitus without complication (HCC)    Dyslipidemia    GERD (gastroesophageal reflux disease)    Numbness    right side of tongue since 2016 surgery   Sleep apnea    uses cpap   Tobacco abuse    Past Surgical History:  Procedure Laterality Date   CHOLECYSTECTOMY N/A 01/10/2016   Procedure: LAPAROSCOPIC CHOLECYSTECTOMY;  Surgeon: Darnell Level, MD;  Location: [facility];  Service: General;  Laterality: N/A;   EYE SURGERY Bilateral    cataract surgery with lens implants   FOOT MASS EXCISION Right 35 yrs ago   KYPHOPLASTY N/A 10/10/2020   Procedure: Lumbar Two Kyphoplasty;  Surgeon: Barnett Abu, MD;  Location: [facility] OR;  Service: Neurosurgery;  Laterality: N/A;   right knuckle replaced  11/30/2014   saliva gland removed  04/2014   dr Clovis Riley gore   SPINE SURGERY     VASECTOMY     Family History  Problem Relation Age of Onset   Stroke Father        Died age 65   CAD Father    CAD Brother 75       CABG   Social History   Socioeconomic History   Marital status: Married    Spouse name: Not on file   Number of children: 2   Years of education: Not on file   Highest education level: Not  on file  Occupational History   Occupation: Ecologist: SELF EMPLOYED  Tobacco Use   Smoking status: Former    Packs/day: 0    Types: Cigarettes    Quit date: 01/17/2015    Years since quitting: 7.6   Smokeless tobacco: Never   Tobacco comments:    He has patches.  Vaping Use   Vaping Use: Never used  Substance and Sexual Activity   Alcohol use: Not Currently    Comment: social   Drug use: No   Sexual activity: Not on file  Other Topics Concern   Not on file  Social History Narrative   Lives at home with wife.    Social Determinants of Health   Financial Resource Strain: Low Risk  (08/29/2022)   Overall Financial Resource Strain (CARDIA)    Difficulty of Paying Living Expenses: Not hard at all  Food Insecurity: No Food Insecurity (08/29/2022)   Hunger Vital Sign    Worried About Running Out of Food in the Last Year: Never true    Ran Out of Food in the Last Year: Never true  Transportation Needs: No Transportation Needs (08/29/2022)   PRAPARE - Administrator, Civil Service (Medical): No    Lack of Transportation (Non-Medical): No  Physical Activity: Insufficiently Active (08/29/2022)   Exercise Vital Sign    Days of Exercise per Week: 3 days    Minutes of Exercise per Session: 30 min  Stress: No Stress Concern Present (08/29/2022)   [employer] of Occupational Health - Occupational Stress Questionnaire    Feeling of Stress : Not at all  Social Connections: Moderately Isolated (08/29/2022)   Social Connection and Isolation Panel [NHANES]    Frequency of Communication with Friends and Family: More than three times a week    Frequency of Social Gatherings with Friends and Family: More than three times a week    Attends Religious Services: Never    Database administrator or Organizations: No    Attends Banker Meetings: Never    Marital Status: Married    Tobacco Counseling Counseling given: Not Answered Tobacco comments: He has  patches.   Clinical Intake:  Pre-visit preparation completed: Yes  Pain : No/denies pain     Nutritional Risks: None Diabetes: Yes Did pt. bring in CBG monitor from home?: No  How often do you need to have someone help you when you read instructions, pamphlets, or other written materials from your doctor or pharmacy?: 1 - Never  Interpreter Needed?: No  Information entered by :: Renie Ora, LPN   Activities of Daily Living    08/29/2022    1:49 PM  In your present state of health, do you have any difficulty performing the following activities:  Hearing? 0  Vision? 0  Difficulty concentrating or making decisions? 0  Walking or climbing stairs? 0  Dressing or bathing? 0  Doing errands, shopping? 0  Preparing Food and eating ? N  Using the Toilet? N  In the past six months, have you accidently leaked urine? N  Do you have problems with loss of bowel control? N  Managing your Medications? N  Managing your Finances? N  Housekeeping or managing your Housekeeping? N    Patient Care Team: [name], DO as PCP - General (Family Medicine) Vida Rigger, MD as Consulting Physician (Gastroenterology) Bevelyn Ngo, NP as Nurse Practitioner (Pulmonary Disease) Michaelle Copas, MD as Referring Physician (Optometry) Cresenciano Genre Lilla Shook, [facility] as Pharmacist (Family Medicine) Audrie Gallus, RN as [facility] Care Management  Indicate any recent Medical Services you may have received from other than [facility] providers in the past year (date may be approximate).     Assessment:   This is a routine wellness examination for [facility].  Hearing/Vision screen Vision Screening - Comments:: Wears rx glasses - up to date with routine eye exams with  Dr.Lee   Dietary issues and exercise activities discussed:     Goals Addressed             This Visit's Progress  Exercise 3x per week (30 min per time)   On track    Continue to exercise and stay healthy.        Depression Screen    08/29/2022    1:48 PM 02/16/2022    9:53 AM 01/10/2022   11:05 AM 09/04/2021   10:30 AM 08/23/2021   10:42 AM 05/31/2021   10:29 AM 04/13/2021    4:18 PM  PHQ 2/9 Scores  PHQ - 2 Score 0 0 0 0 0 0 0    Fall Risk    08/29/2022    1:47 PM 02/16/2022    9:55 AM 01/10/2022   11:05 AM 09/04/2021   10:29 AM 08/23/2021   10:45 AM  Fall Risk   Falls in the past year? 0 0 0 1 1  Number falls in past yr: 0   0 0  Injury with Fall? 0   1 1  Risk for fall due to : No Fall Risks   No Fall Risks History of fall(s);Impaired balance/gait  Follow up Falls prevention discussed   Falls evaluation completed Falls prevention discussed    MEDICARE RISK AT HOME:  Medicare Risk at Home - 08/29/22 1347     Any stairs in or around the home? Yes    If so, are there any without handrails? No    Home free of loose throw rugs in walkways, pet beds, electrical cords, etc? Yes    Adequate lighting in your home to reduce risk of falls? Yes    Life alert? No    Use of a cane, walker or w/c? No    Grab bars in the bathroom? Yes    Shower chair or bench in shower? Yes    Elevated toilet seat or a handicapped toilet? Yes             TIMED UP AND GO:  Was the test performed?  No    Cognitive Function:        08/29/2022    1:49 PM 08/23/2021   10:50 AM  6CIT Screen  What Year? 0 points 0 points  What month? 0 points 0 points  What time? 0 points 0 points  Count back from 20 0 points 0 points  Months in reverse 0 points 0 points  Repeat phrase 0 points 0 points  Total Score 0 points 0 points    Immunizations Immunization History  Administered Date(s) Administered   Fluad Quad(high Dose 65+) 01/10/2022   Influenza,inj,Quad PF,6+ Mos 12/29/2013, 12/30/2014, 02/14/2016, 01/16/2017, 01/24/2018, 03/09/2019   PFIZER(Purple Top)SARS-COV-2 Vaccination 10/23/2019   Pneumococcal Conjugate-13 08/22/2020   Pneumococcal Polysaccharide-23 03/12/2009, 03/17/2021   Tdap 12/30/2014    Zoster Recombinat (Shingrix) 03/17/2021, 08/31/2021   Zoster, Live 02/22/2012    TDAP status: Up to date  Flu Vaccine status: Up to date  Pneumococcal vaccine status: Up to date  Covid-19 vaccine status: Completed vaccines  Qualifies for Shingles Vaccine? Yes   Zostavax completed Yes   Shingrix Completed?: Yes  Screening Tests Health Maintenance  Topic Date Due   COVID-19 Vaccine (2 - 2023-24 season) 11/10/2021   OPHTHALMOLOGY EXAM  03/14/2022   FOOT EXAM  09/05/2022   INFLUENZA VACCINE  10/11/2022   Diabetic kidney evaluation - Urine ACR  01/11/2023   HEMOGLOBIN A1C  01/27/2023   Diabetic kidney evaluation - eGFR measurement  07/27/2023   Medicare Annual Wellness (AWV)  08/29/2023   DTaP/Tdap/Td (2 - Td or Tdap) 12/29/2024   Colonoscopy  02/27/2028   Pneumonia  Vaccine 27+ Years old  Completed   Hepatitis C Screening  Completed   Zoster Vaccines- Shingrix  Completed   HPV VACCINES  Aged Out    Health Maintenance  Health Maintenance Due  Topic Date Due   COVID-19 Vaccine (2 - 2023-24 season) 11/10/2021   OPHTHALMOLOGY EXAM  03/14/2022    Colorectal cancer screening: Type of screening: Colonoscopy. Completed 02/26/2018. Repeat every 10 years  Lung Cancer Screening: (Low Dose CT Chest recommended if Age 35-80 years, 20 pack-year currently smoking OR have quit w/in 15years.) does not qualify.   Lung Cancer Screening Referral: n/a  Additional Screening:  Hepatitis C Screening: does not qualify; Completed 07/12/2016  Vision Screening: Recommended annual ophthalmology exams for early detection of glaucoma and other disorders of the eye. Is the patient up to date with their annual eye exam?  Yes  Who is the provider or what is the name of the office in which the patient attends annual eye exams? Dr.Lee  If pt is not established with a provider, would they like to be referred to a provider to establish care? No .   Dental Screening: Recommended annual dental exams for  proper oral hygiene  Diabetic Foot Exam: Diabetic Foot Exam: Overdue, Pt has been advised about the importance in completing this exam. Pt is scheduled for diabetic foot exam on next office visit .  Community Resource Referral / Chronic Care Management: CRR required this visit?  No   CCM required this visit?  No     Plan:     I have personally reviewed and noted the following in the patient's chart:   Medical and social history Use of alcohol, tobacco or illicit drugs  Current medications and supplements including opioid prescriptions. Patient is not currently taking opioid prescriptions. Functional ability and status Nutritional status Physical activity Advanced directives List of other physicians Hospitalizations, surgeries, and ER visits in previous 12 months Vitals Screenings to include cognitive, depression, and falls Referrals and appointments  In addition, I have reviewed and discussed with patient certain preventive protocols, quality metrics, and best practice recommendations. A written personalized care plan for preventive services as well as general preventive health recommendations were provided to patient.     Lorrene Reid, LPN   06/18/8117   After Visit Summary: (MyChart) Due to this being a telephonic visit, the after visit summary with patients personalized plan was offered to patient via MyChart   Nurse Notes: none

## 2022-10-10 ENCOUNTER — Telehealth: Payer: Self-pay

## 2022-10-10 NOTE — Telephone Encounter (Signed)
Patient informed we have received his patient assistance medication, Ozempic, and it will be placed in the refrigerator to pick up.

## 2022-10-22 ENCOUNTER — Telehealth: Payer: Medicare Other

## 2022-10-22 ENCOUNTER — Other Ambulatory Visit: Payer: Medicare Other | Admitting: *Deleted

## 2022-10-22 ENCOUNTER — Encounter: Payer: Self-pay | Admitting: *Deleted

## 2022-10-22 NOTE — Patient Outreach (Signed)
Care Management   Visit Note  10/22/2022 Name: Scott [name] MRN: [id number] DOB: [birth year]  Subjective: Scott [name] is a 67 y.o. year old male who is a primary care patient of [name], DO. The Care Management team was consulted for assistance.      Engaged with patient spoke with patient by telephone for follow up   Goals Addressed             This Visit's Progress    COMPLETED: CCM (DIABETES) EXPECTED OUTCOME:  MONITOR, SELF-MANAGE AND REDUCE SYMPTOMS OF DIABETES       Current Barriers:  Knowledge Deficits related to Diabetes management Chronic Disease Management support and education needs related to Diabetes, diet No Advanced Directives in place- documents mailed Patient reports he checks CBG 1-2 x per week with fasting ranges continuing in low 100's and decrease in [facility] 07/27/22 6.4 Patient states he used to have [employer] and did well with this but due to not being on insulin the costs was too high, states trying to eat healthy and be mindful of carbohydrate intake, pt has been working with [facility] pharmacist, states he has ozempic and all medications and taking as prescribed. Patient reports he walks 2 x week, recently retired from work. Patient has chronic shoulder pain but states it is under better control, no new issues reported today.  Planned Interventions: Reviewed medications with patient and discussed importance of medication adherence;        Counseled on importance of regular laboratory monitoring as prescribed;        Provided patient with written educational materials related to hypo and hyperglycemia and importance of correct treatment;       Advised patient, providing education and rationale, to check cbg when you have symptoms of low or high blood sugar and per doctor's order/ recommendation  and record        call provider for findings outside established parameters;       Review of patient status, including review of consultants reports,  relevant laboratory and other test results, and medications completed;       Pain assessment completed Reviewed importance of exercise and getting outside daily Reviewed carbohydrate modified diet and food choices Reviewed plan of care with patient including case closure  Symptom Management: Take medications as prescribed   Attend all scheduled provider appointments Call pharmacy for medication refills 3-7 days in advance of running out of medications Attend church or other social activities Perform all self care activities independently  Perform IADL's (shopping, preparing meals, housekeeping, managing finances) independently Call provider office for new concerns or questions  check blood sugar at prescribed times: when you have symptoms of low or high blood sugar and per doctor's order/ recommendation  check feet daily for cuts, sores or redness enter blood sugar readings and medication or insulin into daily log take the blood sugar log to all doctor visits take the blood sugar meter to all doctor visits set goal weight trim toenails straight across fill half of plate with vegetables limit fast food meals to no more than 1 per week manage portion size read food labels for fat, fiber, carbohydrates and portion size wash and dry feet carefully every day wear comfortable, cotton socks wear comfortable, well-fitting shoes Keep up your walking routine Always talk to your pharmacist at primary care provider office if you are unable to afford a medication or need samples Case closure  Follow Up Plan: No further follow up required: case closure  COMPLETED: CCM (HYPERTENSION) EXPECTED OUTCOME: MONITOR, SELF-MANAGE AND REDUCE SYMPTOMS OF HYPERTENSION       Current Barriers:  Knowledge Deficits related to Hypertension management Chronic Disease Management support and education needs related to Hypertension, diet No Advanced Directives in place- documents previously mailed Patient  reports he lives with spouse, is independent with all aspects of his care, continues to drive, recently retired from plumbing. Patient reports he has blood pressure cuff but does not check at home, BP is checked at provider appointments and [employer]. Patient reports he is trying to walk for exercise several times per week, trying to eat as healthy as possible No new concerns reported  Planned Interventions: Evaluation of current treatment plan related to hypertension self management and patient's adherence to plan as established by provider;   Reviewed medications with patient and discussed importance of compliance;  Counseled on the importance of exercise goals with target of 150 minutes per week Discussed plans with patient for ongoing care management follow up and provided patient with direct contact information for care management team; Advised patient, providing education and rationale, to monitor blood pressure daily and record, calling PCP for findings outside established parameters;  Advised patient to discuss any issues with blood pressure, medications, change in health status with provider; Discussed complications of poorly controlled blood pressure such as heart disease, stroke, circulatory complications, vision complications, kidney impairment, sexual dysfunction;  Reviewed low sodium diet, importance of reading food labels and limiting fast food  Symptom Management: Take medications as prescribed   Attend all scheduled provider appointments Call pharmacy for medication refills 3-7 days in advance of running out of medications Attend church or other social activities Perform all self care activities independently  Perform IADL's (shopping, preparing meals, housekeeping, managing finances) independently Call provider office for new concerns or questions  check blood pressure weekly choose a place to take my blood pressure (home, clinic or office, retail store) write blood pressure  results in a log or diary learn about high blood pressure keep a blood pressure log take blood pressure log to all doctor appointments call doctor for signs and symptoms of high blood pressure keep all doctor appointments take medications for blood pressure exactly as prescribed report new symptoms to your doctor eat more whole grains, fruits and vegetables, lean meats and healthy fats Follow low sodium diet- read food labels for sodium content Limit fast food  Follow Up Plan: No further follow up required: Case closure             Plan: No further follow up required: Case closure  [facility], [place]/ Ambulatory Care Management [phone]

## 2022-10-22 NOTE — Patient Instructions (Signed)
Visit Information  Thank you for taking time to visit with me today. Please don't hesitate to contact me if I can be of assistance to you before our next scheduled telephone appointment.  Following are the goals we discussed today:   Goals Addressed             This Visit's Progress    COMPLETED: CCM (DIABETES) EXPECTED OUTCOME:  MONITOR, SELF-MANAGE AND REDUCE SYMPTOMS OF DIABETES       Current Barriers:  Knowledge Deficits related to Diabetes management Chronic Disease Management support and education needs related to Diabetes, diet No Advanced Directives in place- documents mailed Patient reports he checks CBG 1-2 x per week with fasting ranges continuing in low 100's and decrease in [facility] 07/27/22 6.4 Patient states he used to have [employer] and did well with this but due to not being on insulin the costs was too high, states trying to eat healthy and be mindful of carbohydrate intake, pt has been working with [facility] pharmacist, states he has ozempic and all medications and taking as prescribed. Patient reports he walks 2 x week, recently retired from work. Patient has chronic shoulder pain but states it is under better control, no new issues reported today.  Planned Interventions: Reviewed medications with patient and discussed importance of medication adherence;        Counseled on importance of regular laboratory monitoring as prescribed;        Provided patient with written educational materials related to hypo and hyperglycemia and importance of correct treatment;       Advised patient, providing education and rationale, to check cbg when you have symptoms of low or high blood sugar and per doctor's order/ recommendation  and record        call provider for findings outside established parameters;       Review of patient status, including review of consultants reports, relevant laboratory and other test results, and medications completed;       Pain assessment  completed Reviewed importance of exercise and getting outside daily Reviewed carbohydrate modified diet and food choices Reviewed plan of care with patient including case closure  Symptom Management: Take medications as prescribed   Attend all scheduled provider appointments Call pharmacy for medication refills 3-7 days in advance of running out of medications Attend church or other social activities Perform all self care activities independently  Perform IADL's (shopping, preparing meals, housekeeping, managing finances) independently Call provider office for new concerns or questions  check blood sugar at prescribed times: when you have symptoms of low or high blood sugar and per doctor's order/ recommendation  check feet daily for cuts, sores or redness enter blood sugar readings and medication or insulin into daily log take the blood sugar log to all doctor visits take the blood sugar meter to all doctor visits set goal weight trim toenails straight across fill half of plate with vegetables limit fast food meals to no more than 1 per week manage portion size read food labels for fat, fiber, carbohydrates and portion size wash and dry feet carefully every day wear comfortable, cotton socks wear comfortable, well-fitting shoes Keep up your walking routine Always talk to your pharmacist at primary care provider office if you are unable to afford a medication or need samples Case closure  Follow Up Plan: No further follow up required: case closure        COMPLETED: CCM (HYPERTENSION) EXPECTED OUTCOME: MONITOR, SELF-MANAGE AND REDUCE SYMPTOMS OF HYPERTENSION  Current Barriers:  Knowledge Deficits related to Hypertension management Chronic Disease Management support and education needs related to Hypertension, diet No Advanced Directives in place- documents previously mailed Patient reports he lives with spouse, is independent with all aspects of his care, continues to  drive, recently retired from plumbing. Patient reports he has blood pressure cuff but does not check at home, BP is checked at provider appointments and [employer]. Patient reports he is trying to walk for exercise several times per week, trying to eat as healthy as possible No new concerns reported  Planned Interventions: Evaluation of current treatment plan related to hypertension self management and patient's adherence to plan as established by provider;   Reviewed medications with patient and discussed importance of compliance;  Counseled on the importance of exercise goals with target of 150 minutes per week Discussed plans with patient for ongoing care management follow up and provided patient with direct contact information for care management team; Advised patient, providing education and rationale, to monitor blood pressure daily and record, calling PCP for findings outside established parameters;  Advised patient to discuss any issues with blood pressure, medications, change in health status with provider; Discussed complications of poorly controlled blood pressure such as heart disease, stroke, circulatory complications, vision complications, kidney impairment, sexual dysfunction;  Reviewed low sodium diet, importance of reading food labels and limiting fast food  Symptom Management: Take medications as prescribed   Attend all scheduled provider appointments Call pharmacy for medication refills 3-7 days in advance of running out of medications Attend church or other social activities Perform all self care activities independently  Perform IADL's (shopping, preparing meals, housekeeping, managing finances) independently Call provider office for new concerns or questions  check blood pressure weekly choose a place to take my blood pressure (home, clinic or office, retail store) write blood pressure results in a log or diary learn about high blood pressure keep a blood pressure  log take blood pressure log to all doctor appointments call doctor for signs and symptoms of high blood pressure keep all doctor appointments take medications for blood pressure exactly as prescribed report new symptoms to your doctor eat more whole grains, fruits and vegetables, lean meats and healthy fats Follow low sodium diet- read food labels for sodium content Limit fast food  Follow Up Plan: No further follow up required: Case closure             Please call the care guide team at [phone] if you need to cancel or reschedule your appointment.   If you are experiencing a Mental Health or Behavioral Health Crisis or need someone to talk to, please call the Suicide and Crisis Lifeline: 988 call the [place] National Suicide Prevention Lifeline: (573)430-4539 or TTY: 438-113-1655 TTY 332-545-1635) to talk to a trained counselor call 1-800-273-TALK (toll free, 24 hour hotline) go to [facility] [address], [place] [phone]) call the [facility] Crisis Line: (365) [phone] call 911   Patient verbalizes understanding of instructions and care plan provided today and agrees to view in MyChart. Active MyChart status and patient understanding of how to access instructions and care plan via MyChart confirmed with patient.     No further follow up required: case closure  Irving Shows [facility], BSN [place]/ Ambulatory Care Management [phone]

## 2022-10-24 ENCOUNTER — Ambulatory Visit (INDEPENDENT_AMBULATORY_CARE_PROVIDER_SITE_OTHER): Payer: Medicare Other | Admitting: Family Medicine

## 2022-10-24 ENCOUNTER — Encounter: Payer: Self-pay | Admitting: Family Medicine

## 2022-10-24 VITALS — BP 106/61 | HR 84 | Temp 98.7°F | Ht 71.0 in | Wt 206.8 lb

## 2022-10-24 DIAGNOSIS — E1159 Type 2 diabetes mellitus with other circulatory complications: Secondary | ICD-10-CM | POA: Diagnosis not present

## 2022-10-24 DIAGNOSIS — E1165 Type 2 diabetes mellitus with hyperglycemia: Secondary | ICD-10-CM | POA: Diagnosis not present

## 2022-10-24 DIAGNOSIS — R21 Rash and other nonspecific skin eruption: Secondary | ICD-10-CM

## 2022-10-24 DIAGNOSIS — Z7984 Long term (current) use of oral hypoglycemic drugs: Secondary | ICD-10-CM

## 2022-10-24 DIAGNOSIS — E785 Hyperlipidemia, unspecified: Secondary | ICD-10-CM

## 2022-10-24 DIAGNOSIS — E1169 Type 2 diabetes mellitus with other specified complication: Secondary | ICD-10-CM | POA: Diagnosis not present

## 2022-10-24 DIAGNOSIS — E118 Type 2 diabetes mellitus with unspecified complications: Secondary | ICD-10-CM

## 2022-10-24 DIAGNOSIS — I152 Hypertension secondary to endocrine disorders: Secondary | ICD-10-CM

## 2022-10-24 LAB — BAYER DCA HB A1C WAIVED: HB A1C (BAYER DCA - WAIVED): 6.5 % — ABNORMAL HIGH (ref 4.8–5.6)

## 2022-10-24 MED ORDER — CLOTRIMAZOLE-BETAMETHASONE 1-0.05 % EX CREA
1.0000 | TOPICAL_CREAM | Freq: Two times a day (BID) | CUTANEOUS | 0 refills | Status: AC
Start: 2022-10-24 — End: ?

## 2022-10-24 NOTE — Progress Notes (Signed)
Subjective: CC:DM PCP: Scott Ip, DO Scott [name] is a 67 y.o. male presenting to clinic today for:  1. Type 2 Diabetes with hypertension, hyperlipidemia:  Reports blood sugar have been running around 110s to 120s.  No hypoglycemic episodes.  Compliant with Farxiga, metformin, Ozempic, Crestor, lisinopril.  He had a cheat weekend where he went to country music festival in June.  He continues to eat fruit but has eliminated peanut butter and other sugary things.  He is not walking as much as he had been due to it being so hot but plans on doing 3 miles today  Diabetes Health Maintenance Due  Topic Date Due   OPHTHALMOLOGY EXAM  03/14/2022   FOOT EXAM  09/05/2022   HEMOGLOBIN A1C  01/27/2023    Last A1c:  Lab Results  Component Value Date   HGBA1C 6.4 (H) 07/27/2022    ROS: Denies dizziness, LOC, polyuria, polydipsia, unintended weight loss/gain, foot ulcerations, numbness or tingling in extremities, shortness of breath or chest pain.  Denies any dysuria, hematuria, penile discharge or discoloration.  Does occasionally have some arthritis in the left great toe but this is relieved by utilization of shoes.  2.  Rash Patient reports a rash has been present on the left shoulder for the last several weeks.  He describes it is diffusely itchy.  No pain.  Not sure what he came in contact with.   ROS: Per HPI  Allergies  Allergen Reactions   Augmentin [Amoxicillin-Pot Clavulanate] Itching and Nausea And Vomiting   Codeine Itching and Nausea And Vomiting   Penicillins Hives and Itching    Has patient had a PCN reaction causing immediate rash, facial/tongue/throat swelling, SOB or lightheadedness with hypotension: No Has patient had a PCN reaction causing severe rash involving mucus membranes or skin necrosis: No Has patient had a PCN reaction that required hospitalization No Has patient had a PCN reaction occurring within the last 10 years: No If all of the above  answers are "NO", then may proceed with Cephalosporin use.    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HCC)    Asthma    Diabetes mellitus without complication (HCC)    Dyslipidemia    GERD (gastroesophageal reflux disease)    Numbness    right side of tongue since 2016 surgery   Sleep apnea    uses cpap   Tobacco abuse     Current Outpatient Medications:    albuterol (VENTOLIN HFA) 108 (90 Base) MCG/ACT inhaler, Inhale 2 puffs into the lungs every 6 (six) hours as needed for wheezing., Disp: 1 each, Rfl: 3   Ascorbic Acid (VITAMIN C) 1000 MG tablet, Take 1,000 mg by mouth daily., Disp: , Rfl:    aspirin 81 MG tablet, Take 1 tablet (81 mg total) by mouth daily., Disp: 30 tablet, Rfl:    budesonide-formoterol (SYMBICORT) 160-4.5 MCG/ACT inhaler, Inhale 2 puffs into the lungs 2 (two) times daily., Disp: 30.6 g, Rfl: 12   Cholecalciferol (VITAMIN D) 50 MCG (2000 UT) CAPS, Take 2,000 Units by mouth daily., Disp: , Rfl:    cycloSPORINE (RESTASIS) 0.05 % ophthalmic emulsion, Place 1 drop into both eyes 2 (two) times daily. , Disp: , Rfl:    dapagliflozin propanediol (FARXIGA) 10 MG TABS tablet, Take 1 tablet (10 mg  total) by mouth daily before breakfast., Disp: 90 tablet, Rfl: 4   diclofenac (VOLTAREN) 75 MG EC tablet, Take 1 tablet (75 mg total) by mouth 2 (two) times daily., Disp: 30 tablet, Rfl: 0   diclofenac sodium (VOLTAREN) 1 % GEL, Apply 4 g topically 4 (four) times daily., Disp: 350 g, Rfl: 1   lisinopril (ZESTRIL) 5 MG tablet, Take 1 tablet (5 mg total) by mouth daily., Disp: 90 tablet, Rfl: 1   loratadine (CLARITIN) 10 MG tablet, Take 10 mg by mouth daily as needed for allergies., Disp: , Rfl:    metFORMIN (GLUCOPHAGE) 500 MG tablet, Take 2 tablets (1,000 mg total) by mouth 2 (two) times daily., Disp: 360 tablet, Rfl: 1   naproxen  sodium (ALEVE) 220 MG tablet, Take 440 mg by mouth 2 (two) times daily as needed (pain)., Disp: , Rfl:    omeprazole (PRILOSEC) 40 MG capsule, Take 1 capsule (40 mg total) by mouth daily., Disp: 90 capsule, Rfl: 1   rosuvastatin (CRESTOR) 10 MG tablet, Take 1 tablet (10 mg total) by mouth daily., Disp: 90 tablet, Rfl: 1   Semaglutide, 2 MG/DOSE, 8 MG/3ML SOPN, Inject 2 mg as directed once a week. Via novo nordisk patient assistance program, Disp: 9 mL, Rfl: 0   vitamin E 180 MG (400 UNITS) capsule, Take 800 Units by mouth daily., Disp: , Rfl:  Social History   Socioeconomic History   Marital status: Married    Spouse name: Not on file   Number of children: 2   Years of education: Not on file   Highest education level: Not on file  Occupational History   Occupation: Plumber    Employer: SELF EMPLOYED  Tobacco Use   Smoking status: Former    Current packs/day: 0.00    Types: Cigarettes    Quit date: 01/17/2015    Years since quitting: 7.7   Smokeless tobacco: Never   Tobacco comments:    He has patches.  Vaping Use   Vaping status: Never Used  Substance and Sexual Activity   Alcohol use: Not Currently    Comment: social   Drug use: No   Sexual activity: Not on file  Other Topics Concern   Not on file  Social History Narrative   Lives at home with wife.    Social Determinants of Health   Financial Resource Strain: Low Risk  (08/29/2022)   Overall Financial Resource Strain (CARDIA)    Difficulty of Paying Living Expenses: Not hard at all  Food Insecurity: No Food Insecurity (08/29/2022)   Hunger Vital Sign    Worried About Running Out of Food in the Last Year: Never true    Ran Out of Food in the Last Year: Never true  Transportation Needs: No Transportation Needs (08/29/2022)   PRAPARE - Administrator, Civil Service (Medical): No    Lack of Transportation (Non-Medical): No  Physical Activity: Insufficiently Active (08/29/2022)   Exercise Vital Sign    Days of  Exercise per Week: 3 days    Minutes of Exercise per Session: 30 min  Stress: No Stress Concern Present (08/29/2022)   [employer] of Occupational Health - Occupational Stress Questionnaire    Feeling of Stress : Not at all  Social Connections: Moderately Isolated (08/29/2022)   Social Connection and Isolation Panel [NHANES]    Frequency of Communication with Friends and Family: More than three times a week    Frequency of Social Gatherings with Friends and Family: More than  three times a week    Attends Religious Services: Never    Active Member of Clubs or Organizations: No    Attends Banker Meetings: Never    Marital Status: Married  Catering manager Violence: Not At Risk (08/29/2022)   Humiliation, Afraid, Rape, and Kick questionnaire    Fear of Current or Ex-Partner: No    Emotionally Abused: No    Physically Abused: No    Sexually Abused: No   Family History  Problem Relation Age of Onset   Stroke Father        Died age 1   CAD Father    CAD Brother 37       CABG    Objective: Office vital signs reviewed. BP 106/61   Pulse 84   Temp 98.7 F (37.1 C) (Temporal)   Ht 5\' 11"  (1.803 m)   Wt 206 lb 12.8 oz (93.8 kg)   SpO2 97%   BMI 28.84 kg/m   Physical Examination:  General: Awake, alert, well nourished, No acute distress HEENT: sclera whit, MMM Cardio: regular rate and rhythm, S1S2 heard, no murmurs appreciated Pulm: clear to auscultation bilaterally, no wheezes, rhonchi or rales; normal work of breathing on room air Skin: Erythematous, warm rash appreciated on the left shoulder that is approximately 5 inches in circumference.  It has central clearing but no scaling.  No pustules or vesicles appreciated Extremities: warm, well perfused, No edema, cyanosis or clubbing; +2 pulses bilaterally Neuro: see DM foot  Diabetic Foot Exam - Simple   Simple Foot Form Diabetic Foot exam was performed with the following findings: Yes 10/24/2022  9:48 AM   Visual Inspection No deformities, no ulcerations, no other skin breakdown bilaterally: Yes Sensation Testing Intact to touch and monofilament testing bilaterally: Yes Pulse Check Posterior Tibialis and Dorsalis pulse intact bilaterally: Yes Comments Onychomycotic changes to the great toenails bilaterally      Assessment/ Plan: 67 y.o. male   Controlled type 2 diabetes mellitus with complication, without long-term current use of insulin (HCC) - Plan: Bayer DCA Hb A1c Waived, AMB Referral to Pharmacy Medication Management  Hyperlipidemia associated with type 2 diabetes mellitus (HCC) - Plan: AMB Referral to Pharmacy Medication Management  Hypertension associated with diabetes (HCC) - Plan: AMB Referral to Pharmacy Medication Management  Rash and nonspecific skin eruption - Plan: clotrimazole-betamethasone (LOTRISONE) cream   Sugar remains controlled with A1c of 6.5 today.  Foot exam performed.  Will see him back in 4 months.  He needs to schedule diabetic eye exam  Referral to CCM for ongoing patient assistance program paperwork.  He is unsure as to when he needs to have this renewed.  Uncertain etiology of rash been going to treat him with Lotrisone to cover both fungal infection given central clearing of the rash and atopic dermatitis with the topical steroid.  He will call me in 2 weeks if symptoms are not improving at which point we will plan for systemic treatment.  Scott Ip, DO [facility] [phone]

## 2022-10-25 ENCOUNTER — Telehealth: Payer: Self-pay

## 2022-10-25 NOTE — Progress Notes (Signed)
   Care Guide Note  10/25/2022 Name: Scott [name] MRN: [id number] DOB: February 17, 1956  Referred by: [name], DO Reason for referral : Care Management (Outreach to schedule with Pharm d )   Scott [name] is a 67 y.o. year old male who is a primary care patient of [name], DO. Scott [name] was referred to the pharmacist for assistance related to DM.    Successful contact was made with the patient to discuss pharmacy services including being ready for the pharmacist to call at least 5 minutes before the scheduled appointment time, to have medication bottles and any blood sugar or blood pressure readings ready for review. The patient agreed to meet with the pharmacist via with the pharmacist via face to face  on (date/time).  11/27/2022  Penne Lash, RMA Care Guide [facility]  [place], [place] [address] Direct Dial: [phone] [email or web address]

## 2022-11-27 ENCOUNTER — Ambulatory Visit: Payer: Medicare Other

## 2022-11-29 ENCOUNTER — Telehealth: Payer: Self-pay | Admitting: Family Medicine

## 2022-12-06 ENCOUNTER — Ambulatory Visit (INDEPENDENT_AMBULATORY_CARE_PROVIDER_SITE_OTHER): Payer: Medicare Other | Admitting: Pharmacist

## 2022-12-06 DIAGNOSIS — E118 Type 2 diabetes mellitus with unspecified complications: Secondary | ICD-10-CM | POA: Diagnosis not present

## 2022-12-06 DIAGNOSIS — Z7985 Long-term (current) use of injectable non-insulin antidiabetic drugs: Secondary | ICD-10-CM | POA: Diagnosis not present

## 2022-12-10 ENCOUNTER — Other Ambulatory Visit: Payer: Self-pay | Admitting: Family Medicine

## 2022-12-10 DIAGNOSIS — E1165 Type 2 diabetes mellitus with hyperglycemia: Secondary | ICD-10-CM

## 2022-12-13 NOTE — Telephone Encounter (Signed)
Pt. r/s for in person.

## 2022-12-25 ENCOUNTER — Other Ambulatory Visit: Payer: Self-pay

## 2022-12-25 DIAGNOSIS — E118 Type 2 diabetes mellitus with unspecified complications: Secondary | ICD-10-CM

## 2023-01-02 NOTE — Progress Notes (Signed)
01/03/2023 Name: Scott [name] MRN: [id number] DOB: April 05, 1955  Chief Complaint  Patient presents with   Diabetes    Scott [name] is a 67 y.o. year old male who presented for a telephone visit.   They were referred to the pharmacist by their PCP for assistance in managing diabetes and medication access.Today he reports for occasional nausea/"empty stomach feeling" that resolves with food intake (protein shakes).   Subjective:  Care Team: Primary Care Provider: [name], DO ; Next Scheduled Visit: 03/07/2023 [facility] order in place)  Medication Access/Adherence  Current Pharmacy:  [facility] - [place], [place] - [address] [place] [address] Phone: [phone] Fax: [phone]  [id number] - [place],  - [address] [place] [address] Phone: [phone] Fax: [phone]  [facility] - [place], [place] - [address] [place] [address] Phone: [phone] Fax: [phone]   Patient reports affordability concerns with their medications: No  Patient reports access/transportation concerns to their pharmacy: No  Patient reports adherence concerns with their medications:  No     Diabetes:  Current medications:  Ozempic 2 mg weekly [employer]) Metformin 1000 mg twice daily  Farxiga 10 mg daily (AZ&ME)   Medications tried in the past:  Rybelsus 14 mg daily  Janumet 50-1000 mg twice daily    Current glucose readings: 120-140s (typically before or shortly after breakfast)  Previously (Summer 2023) used [employer] 2 CGM sensor sample provided by clinic, however was unable to continue due to not being covered by insurance (non-insulin managed diabetes).    Currently he uses a Accu Check meter   Patient denies hypoglycemic s/sx including dizziness, shakiness, sweating. Patient denies hyperglycemic symptoms including polyuria, polydipsia, polyphagia,  nocturia, neuropathy, blurred vision.  Current meal patterns:  -He is being a little more indulgent as he is on vacation  Current physical activity:  -He is still remaining physically active while on vacation.   Current medication access support: AZ&me (Farxiga, Symbicort) and Novo nordisk (Ozempic)    Objective:  Lab Results  Component Value Date   HGBA1C 6.5 (H) 10/24/2022    Lab Results  Component Value Date   CREATININE 0.96 07/27/2022   BUN 17 07/27/2022   NA 138 07/27/2022   K 4.6 07/27/2022   CL 101 07/27/2022   CO2 22 07/27/2022    Lab Results  Component Value Date   CHOL 117 07/27/2022   HDL 51 07/27/2022   LDLCALC 50 07/27/2022   TRIG 77 07/27/2022   CHOLHDL 2.3 07/27/2022    Medications Reviewed Today     Reviewed by Gwenlyn Found, RPH (Pharmacist) on 01/03/23 at 1202  Med List Status: <None>   Medication Order Taking? Sig Documenting Provider Last Dose Status Informant  albuterol (VENTOLIN HFA) 108 (90 Base) MCG/ACT inhaler [id number]  Inhale 2 puffs into the lungs every 6 (six) hours as needed for wheezing.  Patient not taking: Reported on 12/06/2022   [name], DO  Active   aspirin 81 MG tablet [id number]  Take 1 tablet (81 mg total) by mouth daily. Henrene Pastor, RPH-CPP  Active Self           Med Note (COX, HEATHER C   Thu Nov 15, 2016  9:37 AM)    budesonide-formoterol [facility]) 160-4.5 MCG/ACT inhaler [id number] Yes Inhale 2 puffs into the lungs 2 (two) times daily. [name], DO Taking  Active            Med Note Cresenciano Genre, Ranessa Kosta D   Thu Sep 14, 2021  1:59 PM) Via AZ&me patient assistance program    clotrimazole-betamethasone (LOTRISONE) cream [id number]  Apply 1 Application topically 2 (two) times daily. To left shoulder Delynn Flavin M, DO  Active   cycloSPORINE (RESTASIS) 0.05 % ophthalmic emulsion [id number]  Place 1 drop into both eyes 2 (two) times daily.  [provider]  Active Self  dapagliflozin  propanediol (FARXIGA) 10 MG TABS tablet [id number] Yes Take 1 tablet (10 mg total) by mouth daily before breakfast. [name], DO Taking Active            Med Note Cresenciano Genre, Lilla Shook   Wed Jan 24, 2022 12:15 PM) Via AZ&me patient assistance program   diclofenac (VOLTAREN) 75 MG EC tablet [id number]  Take 1 tablet (75 mg total) by mouth 2 (two) times daily. Jannifer Rodney A, FNP  Active Self  diclofenac sodium (VOLTAREN) 1 % GEL [id number]  Apply 4 g topically 4 (four) times daily. Delynn Flavin M, DO  Active Self  lisinopril (ZESTRIL) 5 MG tablet [id number]  Take 1 tablet (5 mg total) by mouth daily. Delynn Flavin M, DO  Active   loratadine (CLARITIN) 10 MG tablet [id number]  Take 10 mg by mouth daily as needed for allergies. [provider]  Active Self  metFORMIN (GLUCOPHAGE) 500 MG tablet [id number] Yes TAKE TWO (2) TABLETS BY MOUTH 2 TIMES DAILY [name], DO Taking Active   Multiple Vitamins-Minerals (CENTRUM SILVER 50+MEN PO) [id number]  Take by mouth. [provider]  Active   naproxen sodium (ALEVE) 220 MG tablet [id number]  Take 440 mg by mouth 2 (two) times daily as needed (pain). [provider]  Active Self  omeprazole (PRILOSEC) 40 MG capsule [id number]  Take 1 capsule (40 mg total) by mouth daily. Delynn Flavin M, DO  Active   rosuvastatin (CRESTOR) 10 MG tablet [id number]  Take 1 tablet (10 mg total) by mouth daily. [name], DO  Active   Semaglutide, 2 MG/DOSE, 8 MG/3ML SOPN [id number] Yes Inject 2 mg as directed once a week. Via novo nordisk patient assistance program Delynn Flavin M, [place] Taking Active   vitamin E 180 MG (400 UNITS) capsule [id number]  Take 800 Units by mouth daily. [provider]  Active Self             Assessment/Plan:   Diabetes: - Currently controlled. A1c 6.5 (goal <7). Fasting blood sugars around goal at 120s-140 (goal <120) - Reviewed long term cardiovascular and renal outcomes of  uncontrolled blood sugar - Reviewed goal A1c, goal fasting, and goal 2 hour post prandial glucose - Recommend to continue Ozempic, metformin, and Farxiga as prescribed.  - Recommend to check glucose at least once daily.  - Meets financial criteria for AZ&me patient assistance program for [place] and Ozempic through end of 2024. Currently processing 2025 PAP enrollment. Will transition to Breztri PAP once patient runs out of/can no longer afford Symbicort.    Follow Up Plan: Return in January 2025 for A1c check   Sofie Rower, PharmD [facility] PGY-1    Kieth Brightly, PharmD, BCACP, CPP Clinical Pharmacist, [facility]

## 2023-01-03 ENCOUNTER — Ambulatory Visit (INDEPENDENT_AMBULATORY_CARE_PROVIDER_SITE_OTHER): Payer: Medicare Other | Admitting: Pharmacist

## 2023-01-03 DIAGNOSIS — Z7985 Long-term (current) use of injectable non-insulin antidiabetic drugs: Secondary | ICD-10-CM

## 2023-01-03 DIAGNOSIS — Z7984 Long term (current) use of oral hypoglycemic drugs: Secondary | ICD-10-CM

## 2023-01-03 DIAGNOSIS — E119 Type 2 diabetes mellitus without complications: Secondary | ICD-10-CM

## 2023-01-16 ENCOUNTER — Encounter: Payer: Self-pay | Admitting: Pharmacist

## 2023-02-04 ENCOUNTER — Other Ambulatory Visit: Payer: Self-pay | Admitting: Family Medicine

## 2023-02-04 DIAGNOSIS — E1159 Type 2 diabetes mellitus with other circulatory complications: Secondary | ICD-10-CM

## 2023-02-12 ENCOUNTER — Telehealth: Payer: Self-pay | Admitting: Family Medicine

## 2023-02-25 ENCOUNTER — Ambulatory Visit: Payer: Medicare Other | Admitting: Family Medicine

## 2023-03-04 ENCOUNTER — Other Ambulatory Visit: Payer: Self-pay | Admitting: Family Medicine

## 2023-03-04 DIAGNOSIS — E1169 Type 2 diabetes mellitus with other specified complication: Secondary | ICD-10-CM

## 2023-03-04 DIAGNOSIS — E1165 Type 2 diabetes mellitus with hyperglycemia: Secondary | ICD-10-CM

## 2023-04-12 ENCOUNTER — Telehealth: Payer: Self-pay | Admitting: Family Medicine

## 2023-04-12 NOTE — Telephone Encounter (Signed)
Called they stated someone had already handled to dx verification. LS

## 2023-04-12 NOTE — Telephone Encounter (Unsigned)
Copied from CRM [id number]. Topic: General - Other >> Apr 11, 2023  4:37 PM Clayton Bibles wrote: Reason for CRM: Casimiro Needle with [employer] wants to know is Effrey has diabetes, cardiovascular disease, or chronic heart failure. If he has one of these, he can apply for a special program through [employer]. Please call [phone] Ref #[id number] Anyone will be able to assist you.

## 2023-04-26 ENCOUNTER — Telehealth: Payer: Self-pay | Admitting: Family Medicine

## 2023-04-26 ENCOUNTER — Ambulatory Visit (INDEPENDENT_AMBULATORY_CARE_PROVIDER_SITE_OTHER): Payer: Medicare Other | Admitting: Family Medicine

## 2023-04-26 VITALS — BP 125/78 | HR 82 | Temp 97.7°F | Ht 71.0 in | Wt 210.2 lb

## 2023-04-26 DIAGNOSIS — I152 Hypertension secondary to endocrine disorders: Secondary | ICD-10-CM

## 2023-04-26 DIAGNOSIS — E785 Hyperlipidemia, unspecified: Secondary | ICD-10-CM

## 2023-04-26 DIAGNOSIS — E1159 Type 2 diabetes mellitus with other circulatory complications: Secondary | ICD-10-CM | POA: Diagnosis not present

## 2023-04-26 DIAGNOSIS — Z7985 Long-term (current) use of injectable non-insulin antidiabetic drugs: Secondary | ICD-10-CM

## 2023-04-26 DIAGNOSIS — E119 Type 2 diabetes mellitus without complications: Secondary | ICD-10-CM | POA: Diagnosis not present

## 2023-04-26 DIAGNOSIS — E1169 Type 2 diabetes mellitus with other specified complication: Secondary | ICD-10-CM

## 2023-04-26 DIAGNOSIS — K5903 Drug induced constipation: Secondary | ICD-10-CM

## 2023-04-26 MED ORDER — METFORMIN HCL 500 MG PO TABS
1000.0000 mg | ORAL_TABLET | Freq: Two times a day (BID) | ORAL | 3 refills | Status: DC
Start: 2023-04-26 — End: 2024-02-05

## 2023-04-26 MED ORDER — LISINOPRIL 5 MG PO TABS: 5.0000 mg | ORAL_TABLET | Freq: Every day | ORAL | 3 refills | Status: DC

## 2023-04-26 MED ORDER — LINACLOTIDE 72 MCG PO CAPS: 72.0000 ug | ORAL_CAPSULE | Freq: Every day | ORAL | Status: AC

## 2023-04-26 MED ORDER — OMEPRAZOLE 40 MG PO CPDR: 40.0000 mg | DELAYED_RELEASE_CAPSULE | Freq: Every day | ORAL | 3 refills | Status: DC

## 2023-04-26 MED ORDER — ROSUVASTATIN CALCIUM 10 MG PO TABS
10.0000 mg | ORAL_TABLET | Freq: Every day | ORAL | 3 refills | Status: DC
Start: 2023-04-26 — End: 2024-02-05

## 2023-04-26 NOTE — Progress Notes (Signed)
Subjective: CC:DM PCP: [name], DO [id number]:[id number] Scott [name] is a 68 y.o. male presenting to clinic today for:  1. Type 2 Diabetes with hypertension, hyperlipidemia:  Compliant with medications but has not really been compliant with diet since Thanksgiving.  He reports no genitourinary symptoms.  No hypoglycemic episodes.  Does have constipation and admits that he does not always drink enough water.  Sometimes it really hurts and he has to strain.  Not exercising regularly.  Diabetes Health Maintenance Due  Topic Date Due   OPHTHALMOLOGY EXAM  03/14/2022   HEMOGLOBIN A1C  04/26/2023   FOOT EXAM  10/24/2023    Last A1c:  Lab Results  Component Value Date   HGBA1C 6.5 (H) 10/24/2022    ROS: denies dizziness, LOC, polyuria, polydipsia, unintended weight loss/gain, foot ulcerations, numbness or tingling in extremities, shortness of breath or chest pain.   ROS: Per HPI  Allergies  Allergen Reactions   Augmentin [Amoxicillin-Pot Clavulanate] Itching and Nausea And Vomiting   Codeine Itching and Nausea And Vomiting   Penicillins Hives and Itching    Has patient had a PCN reaction causing immediate rash, facial/tongue/throat swelling, SOB or lightheadedness with hypotension: No Has patient had a PCN reaction causing severe rash involving mucus membranes or skin necrosis: No Has patient had a PCN reaction that required hospitalization No Has patient had a PCN reaction occurring within the last 10 years: No If all of the above answers are "NO", then may proceed with Cephalosporin use.    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HCC)    Asthma    Diabetes mellitus without complication (HCC)    Dyslipidemia    GERD (gastroesophageal reflux disease)    Numbness    right side of tongue since 2016 surgery   Sleep apnea    uses  cpap   Tobacco abuse     Current Outpatient Medications:    albuterol (VENTOLIN HFA) 108 (90 Base) MCG/ACT inhaler, Inhale 2 puffs into the lungs every 6 (six) hours as needed for wheezing., Disp: 1 each, Rfl: 3   aspirin 81 MG tablet, Take 1 tablet (81 mg total) by mouth daily., Disp: 30 tablet, Rfl:    azithromycin (ZITHROMAX) 250 MG tablet, Take 250 mg by mouth as directed., Disp: , Rfl:    budesonide-formoterol (SYMBICORT) 160-4.5 MCG/ACT inhaler, Inhale 2 puffs into the lungs 2 (two) times daily., Disp: 30.6 g, Rfl: 12   clotrimazole-betamethasone (LOTRISONE) cream, Apply 1 Application topically 2 (two) times daily. To left shoulder, Disp: 30 g, Rfl: 0   cycloSPORINE (RESTASIS) 0.05 % ophthalmic emulsion, Place 1 drop into both eyes 2 (two) times daily. , Disp: , Rfl:    dapagliflozin propanediol (FARXIGA) 10 MG TABS tablet, Take 1 tablet (10 mg total) by mouth daily before breakfast., Disp: 90 tablet, Rfl: 4   diclofenac sodium (VOLTAREN) 1 % GEL, Apply 4 g topically 4 (four) times daily., Disp: 350 g, Rfl: 1   loratadine (CLARITIN) 10 MG tablet, Take 10 mg by mouth daily as needed for allergies., Disp: , Rfl:    Multiple Vitamins-Minerals (CENTRUM SILVER 50+MEN PO), Take by mouth., Disp: , Rfl:    naproxen sodium (ALEVE) 220 MG tablet, Take 440 mg by mouth 2 (two) times daily as needed (pain)., Disp: , Rfl:  Semaglutide, 2 MG/DOSE, 8 MG/3ML SOPN, Inject 2 mg as directed once a week. Via novo nordisk patient assistance program, Disp: 9 mL, Rfl: 0   vitamin E 180 MG (400 UNITS) capsule, Take 800 Units by mouth daily., Disp: , Rfl:    lisinopril (ZESTRIL) 5 MG tablet, Take 1 tablet (5 mg total) by mouth daily., Disp: 90 tablet, Rfl: 3   metFORMIN (GLUCOPHAGE) 500 MG tablet, Take 2 tablets (1,000 mg total) by mouth 2 (two) times daily with a meal., Disp: 360 tablet, Rfl: 3   omeprazole (PRILOSEC) 40 MG capsule, Take 1 capsule (40 mg total) by mouth daily., Disp: 90 capsule, Rfl: 3    rosuvastatin (CRESTOR) 10 MG tablet, Take 1 tablet (10 mg total) by mouth daily., Disp: 90 tablet, Rfl: 3 Social History   Socioeconomic History   Marital status: Married    Spouse name: Not on file   Number of children: 2   Years of education: Not on file   Highest education level: Associate degree: occupational, Scientist, product/process development, or vocational program  Occupational History   Occupation: Ecologist: SELF EMPLOYED  Tobacco Use   Smoking status: Former    Current packs/day: 0.00    Types: Cigarettes    Quit date: 01/17/2015    Years since quitting: 8.2   Smokeless tobacco: Never   Tobacco comments:    He has patches.  Vaping Use   Vaping status: Never Used  Substance and Sexual Activity   Alcohol use: Not Currently    Comment: social   Drug use: No   Sexual activity: Not on file  Other Topics Concern   Not on file  Social History Narrative   Lives at home with wife.    Social Drivers of Health   Financial Resource Strain: Medium Risk (04/26/2023)   Overall Financial Resource Strain (CARDIA)    Difficulty of Paying Living Expenses: Somewhat hard  Food Insecurity: No Food Insecurity (04/26/2023)   Hunger Vital Sign    Worried About Running Out of Food in the Last Year: Never true    Ran Out of Food in the Last Year: Never true  Transportation Needs: No Transportation Needs (04/26/2023)   PRAPARE - Administrator, Civil Service (Medical): No    Lack of Transportation (Non-Medical): No  Physical Activity: Insufficiently Active (04/26/2023)   Exercise Vital Sign    Days of Exercise per Week: 2 days    Minutes of Exercise per Session: 30 min  Stress: No Stress Concern Present (04/26/2023)   [employer] of Occupational Health - Occupational Stress Questionnaire    Feeling of Stress : Only a little  Social Connections: Unknown (04/26/2023)   Social Connection and Isolation Panel [NHANES]    Frequency of Communication with Friends and Family: More than three  times a week    Frequency of Social Gatherings with Friends and Family: More than three times a week    Attends Religious Services: Patient declined    Database administrator or Organizations: No    Attends Banker Meetings: Never    Marital Status: Married  Catering manager Violence: Not At Risk (08/29/2022)   Humiliation, Afraid, Rape, and Kick questionnaire    Fear of Current or Ex-Partner: No    Emotionally Abused: No    Physically Abused: No    Sexually Abused: No   Family History  Problem Relation Age of Onset   Stroke Father  Died age 80   CAD Father    CAD Brother 46       CABG    Objective: Office vital signs reviewed. BP 125/78   Pulse 82   Temp 97.7 F (36.5 C) (Temporal)   Ht 5\' 11"  (1.803 m)   Wt 210 lb 3.2 oz (95.3 kg)   SpO2 96%   BMI 29.32 kg/m   Physical Examination:  General: Awake, alert, well nourished, No acute distress HEENT: sclera white, MMM Cardio: regular rate and rhythm, S1S2 heard, no murmurs appreciated Pulm: clear to auscultation bilaterally, no wheezes, rhonchi or rales; normal work of breathing on room air GI: soft, non-tender, non-distended, bowel sounds present x4, no hepatomegaly, no splenomegaly, no masses  Assessment/ Plan: 68 y.o. male   Diabetes mellitus treated with injections of non-insulin medication (HCC) - Plan: Microalbumin / creatinine urine ratio, CBC with Differential/Platelet, CMP14+EGFR, Hemoglobin A1c, metFORMIN (GLUCOPHAGE) 500 MG tablet  Hyperlipidemia associated with type 2 diabetes mellitus (HCC) - Plan: CMP14+EGFR, rosuvastatin (CRESTOR) 10 MG tablet  Hypertension associated with diabetes (HCC) - Plan: Microalbumin / creatinine urine ratio, CMP14+EGFR, lisinopril (ZESTRIL) 5 MG tablet  Drug induced constipation - Plan: linaclotide (LINZESS) 72 MCG capsule  Check labs, meds renewed.  Plan for fasting lab in fall with CPE  Encouraged PO hydration.  Colace BID  prn.  Linzess sample as back  up.  [name], DO [facility] [phone]

## 2023-04-27 LAB — CMP14+EGFR
ALT: 25 [IU]/L (ref 0–44)
AST: 22 [IU]/L (ref 0–40)
Albumin: 4.5 g/dL (ref 3.9–4.9)
Alkaline Phosphatase: 56 [IU]/L (ref 44–121)
BUN/Creatinine Ratio: 15 (ref 10–24)
BUN: 13 mg/dL (ref 8–27)
Bilirubin Total: 0.7 mg/dL (ref 0.0–1.2)
CO2: 20 mmol/L (ref 20–29)
Calcium: 9 mg/dL (ref 8.6–10.2)
Chloride: 102 mmol/L (ref 96–106)
Creatinine, Ser: 0.84 mg/dL (ref 0.76–1.27)
Globulin, Total: 2.5 g/dL (ref 1.5–4.5)
Glucose: 122 mg/dL — ABNORMAL HIGH (ref 70–99)
Potassium: 4.3 mmol/L (ref 3.5–5.2)
Sodium: 137 mmol/L (ref 134–144)
Total Protein: 7 g/dL (ref 6.0–8.5)
eGFR: 96 mL/min/{1.73_m2} (ref >59)

## 2023-04-27 LAB — HEMOGLOBIN A1C
Est. average glucose Bld gHb Est-mCnc: 154 mg/dL
Hgb A1c MFr Bld: 7 % — ABNORMAL HIGH (ref 4.8–5.6)

## 2023-04-27 LAB — CBC WITH DIFFERENTIAL/PLATELET
Basophils Absolute: 0.1 10*3/uL (ref 0.0–0.2)
Basos: 1 %
EOS (ABSOLUTE): 0.1 10*3/uL (ref 0.0–0.4)
Eos: 2 %
Hematocrit: 46.1 % (ref 37.5–51.0)
Hemoglobin: 15.8 g/dL (ref 13.0–17.7)
Immature Grans (Abs): 0 10*3/uL (ref 0.0–0.1)
Immature Granulocytes: 0 %
Lymphocytes Absolute: 1.6 10*3/uL (ref 0.7–3.1)
Lymphs: 28 %
MCH: 31.9 pg (ref 26.6–33.0)
MCHC: 34.3 g/dL (ref 31.5–35.7)
MCV: 93 fL (ref 79–97)
Monocytes Absolute: 0.4 10*3/uL (ref 0.1–0.9)
Monocytes: 7 %
Neutrophils Absolute: 3.6 10*3/uL (ref 1.4–7.0)
Neutrophils: 62 %
Platelets: 175 10*3/uL (ref 150–450)
RBC: 4.95 x10E6/uL (ref 4.14–5.80)
RDW: 12.5 % (ref 11.6–15.4)
WBC: 5.8 10*3/uL (ref 3.4–10.8)

## 2023-04-27 LAB — MICROALBUMIN / CREATININE URINE RATIO
Creatinine, Urine: 45.3 mg/dL
Microalb/Creat Ratio: 8 mg/g{creat} (ref 0–29)
Microalbumin, Urine: 3.8 ug/mL

## 2023-04-29 ENCOUNTER — Encounter: Payer: Self-pay | Admitting: Family Medicine

## 2023-05-16 ENCOUNTER — Telehealth: Payer: Self-pay

## 2023-05-16 ENCOUNTER — Telehealth: Payer: Self-pay | Admitting: Pharmacist

## 2023-05-16 ENCOUNTER — Other Ambulatory Visit (HOSPITAL_COMMUNITY): Payer: Self-pay

## 2023-05-16 NOTE — Telephone Encounter (Signed)
 Can you check on PAP status and message/call patient about Ozempic 2mg ? I know he was denied for farxiga due to over income  Let me know if paperwork needs to be done or we can do electronic! Thanks!

## 2023-05-16 NOTE — Progress Notes (Signed)
 Pharmacy Medication Assistance Program Note    05/31/2023  Patient ID: Scott [name], male   DOB: 12-Feb-1956, 69 y.o.   MRN: [id number]     05/16/2023  Outreach Medication One  Manufacturer Medication One [employer] [facility] Ozempic  Type of Radiographer, therapeutic Assistance  Date Application Submitted to Manufacturer 05/16/2023  Method Application Sent to Manufacturer Online  Patient Assistance Determination Approved  Approval Start Date 05/21/2023  Approval End Date 03/11/2024     Renewal approved - should arrive in 10-14 business days. Expect shipping delays.

## 2023-05-30 ENCOUNTER — Other Ambulatory Visit

## 2023-05-30 ENCOUNTER — Telehealth: Payer: Self-pay | Admitting: Family Medicine

## 2023-05-30 NOTE — Telephone Encounter (Unsigned)
 Copied from CRM [id number]. Topic: Appointments - Appointment Info/Confirmation >> May 30, 2023  3:39 PM Shelah Lewandowsky wrote: Missed call with Raynelle Fanning and need to speak with her today about medication- please call [phone]

## 2023-06-04 ENCOUNTER — Telehealth: Payer: Self-pay | Admitting: Pharmacist

## 2023-06-04 NOTE — Telephone Encounter (Addendum)
Called patient and discussed medications.

## 2023-06-05 NOTE — Telephone Encounter (Signed)
 Call returned see pharmD encounter

## 2023-06-18 ENCOUNTER — Telehealth: Payer: Self-pay | Admitting: *Deleted

## 2023-06-18 NOTE — Telephone Encounter (Signed)
 Patient aware that Ozempic is available for pick up.

## 2023-06-25 NOTE — Telephone Encounter (Signed)
 Call placed to patient to let him know his Ozempic 2mg  has arrived at PCP office.  He is stable on current therapy.  Continue all other medications as prescribed.

## 2023-07-01 NOTE — Telephone Encounter (Signed)
 Close encounter

## 2023-08-09 ENCOUNTER — Telehealth: Payer: Self-pay | Admitting: Family Medicine

## 2023-08-09 NOTE — Telephone Encounter (Signed)
 Pt needs CPAP supplies, looks like [facility] oversees his CPAP and that he NTBS by them, gave him their phone number to call and make an appointment.

## 2023-08-09 NOTE — Telephone Encounter (Signed)
 Copied from CRM [id number]. Topic: Clinical - Order For Equipment >> Aug 09, 2023  2:08 PM Geneva B wrote: Reason for CRM: [employer] is calling stating that patient wants to get a cpap machine

## 2023-08-12 ENCOUNTER — Telehealth: Payer: Self-pay

## 2023-08-12 NOTE — Telephone Encounter (Signed)
 Copied from CRM [id number]. Topic: Appointments - Appointment Scheduling >> Aug 09, 2023  2:33 PM Scott [name] wrote: Patient/patient representative is calling to schedule an appointment. Refer to attachments for appointment information.  Patient wants to know if he need to bring in his cpap machine, since his appointment is to get a new order placed for his cpap supplies.  Lm x1 for pt.

## 2023-08-19 NOTE — Telephone Encounter (Signed)
 ATC x2- ling rang for >73min and then busy dial. Will call back once more.

## 2023-08-21 ENCOUNTER — Encounter: Payer: Self-pay | Admitting: Pulmonary Disease

## 2023-08-21 ENCOUNTER — Ambulatory Visit: Admitting: Pulmonary Disease

## 2023-08-21 VITALS — BP 112/74 | HR 87 | Ht 71.0 in | Wt 203.6 lb

## 2023-08-21 DIAGNOSIS — F17200 Nicotine dependence, unspecified, uncomplicated: Secondary | ICD-10-CM

## 2023-08-21 DIAGNOSIS — G4733 Obstructive sleep apnea (adult) (pediatric): Secondary | ICD-10-CM

## 2023-08-21 DIAGNOSIS — F17210 Nicotine dependence, cigarettes, uncomplicated: Secondary | ICD-10-CM

## 2023-08-21 DIAGNOSIS — J9801 Acute bronchospasm: Secondary | ICD-10-CM

## 2023-08-21 DIAGNOSIS — G4730 Sleep apnea, unspecified: Secondary | ICD-10-CM

## 2023-08-21 DIAGNOSIS — J439 Emphysema, unspecified: Secondary | ICD-10-CM | POA: Diagnosis not present

## 2023-08-21 MED ORDER — ALBUTEROL SULFATE HFA 108 (90 BASE) MCG/ACT IN AERS
2.0000 | INHALATION_SPRAY | Freq: Four times a day (QID) | RESPIRATORY_TRACT | 3 refills | Status: AC | PRN
Start: 2023-08-21 — End: ?

## 2023-08-21 NOTE — Progress Notes (Signed)
 Scott [name]    [id number]    1955-03-18  Primary Care Physician:Gottschalk, Gwendalyn Lemma, DO  Referring Physician: [name] Guerin, DO [address] [place],  [place] [address]  Chief complaint:  Follow-up for sleep apnea, emphysema Acute visit as needed CPAP supplies  HPI: 68 y.o. who  has a past medical history of Allergy, Anxiety, Arthritis, Asbestosis (HCC), Asthma, Diabetes mellitus without complication (HCC), Dyslipidemia, GERD (gastroesophageal reflux disease), Numbness, Sleep apnea, and Tobacco abuse.  Discussed the use of AI scribe software for clinical note transcription with the patient, who gave verbal consent to proceed.  History of Present Illness Scott [name] is a 68 year old male with severe sleep apnea who presents for a follow-up to obtain CPAP supplies.  He is a patient of Dr. Gaynell Keeler  He has a long-standing history of severe sleep apnea, diagnosed over 30 years ago, and has been using a CPAP machine consistently for the past 30 years with 100% compliance. He recently obtained a new CPAP machine in October 2023 after his previous machine broke. He is currently using an auto set CPAP with settings of 10 to 20 cm H2O, and his residual apnea-hypopnea index (AHI) is 0.4. He requires an annual doctor's visit to continue receiving CPAP supplies due to insurance requirements.  He has a significant smoking history of 30 pack years, having quit smoking in November 2016. He uses an albuterol  inhaler as needed, particularly in humid conditions, and prefers it over daily inhalers like Symbicort . He keeps multiple inhalers in various locations, such as his truck and boat, to ensure accessibility when needed.  Outpatient Encounter Medications as of 08/21/2023  Medication Sig   aspirin  81 MG tablet Take 1 tablet (81 mg total) by mouth daily.   budesonide -formoterol  (SYMBICORT ) 160-4.5 MCG/ACT inhaler Inhale 2 puffs into the lungs 2 (two) times daily.   cycloSPORINE   (RESTASIS ) 0.05 % ophthalmic emulsion Place 1 drop into both eyes 2 (two) times daily.    dapagliflozin  propanediol (FARXIGA ) 10 MG TABS tablet Take 1 tablet (10 mg total) by mouth daily before breakfast.   linaclotide  (LINZESS ) 72 MCG capsule Take 1 capsule (72 mcg total) by mouth daily before breakfast.   lisinopril  (ZESTRIL ) 5 MG tablet Take 1 tablet (5 mg total) by mouth daily.   loratadine (CLARITIN) 10 MG tablet Take 10 mg by mouth daily as needed for allergies.   metFORMIN  (GLUCOPHAGE ) 500 MG tablet Take 2 tablets (1,000 mg total) by mouth 2 (two) times daily with a meal.   naproxen  sodium (ALEVE ) 220 MG tablet Take 440 mg by mouth 2 (two) times daily as needed (pain).   omeprazole  (PRILOSEC) 40 MG capsule Take 1 capsule (40 mg total) by mouth daily.   rosuvastatin  (CRESTOR ) 10 MG tablet Take 1 tablet (10 mg total) by mouth daily.   Semaglutide , 2 MG/DOSE, 8 MG/3ML SOPN Inject 2 mg as directed once a week. Via novo nordisk patient assistance program   [DISCONTINUED] albuterol  (VENTOLIN  HFA) 108 (90 Base) MCG/ACT inhaler Inhale 2 puffs into the lungs every 6 (six) hours as needed for wheezing.   albuterol  (VENTOLIN  HFA) 108 (90 Base) MCG/ACT inhaler Inhale 2 puffs into the lungs every 6 (six) hours as needed for wheezing.   azithromycin  (ZITHROMAX ) 250 MG tablet Take 250 mg by mouth as directed. (Patient not taking: Reported on 08/21/2023)   clotrimazole -betamethasone  (LOTRISONE ) cream Apply 1 Application topically 2 (two) times daily. To left shoulder (Patient not taking: Reported  on 08/21/2023)   diclofenac  sodium (VOLTAREN ) 1 % GEL Apply 4 g topically 4 (four) times daily. (Patient not taking: Reported on 08/21/2023)   Multiple Vitamins-Minerals (CENTRUM SILVER 50+MEN PO) Take by mouth. (Patient not taking: Reported on 08/21/2023)   vitamin E 180 MG (400 UNITS) capsule Take 800 Units by mouth daily. (Patient not taking: Reported on 08/21/2023)   No facility-administered encounter medications on  file as of 08/21/2023.   Physical Exam: Blood pressure 112/74, pulse 87, height 5' 11 (1.803 m), weight 203 lb 9.6 oz (92.4 kg), SpO2 95%. Gen:      No acute distress HEENT:  EOMI, sclera anicteric Neck:     No masses; no thyromegaly Lungs:    Clear to auscultation bilaterally; normal respiratory effort CV:         Regular rate and rhythm; no murmurs Abd:      + bowel sounds; soft, non-tender; no palpable masses, no distension Ext:    No edema; adequate peripheral perfusion Skin:      Warm and dry; no rash Neuro: alert and oriented x 3 Psych: normal mood and affect  Data Reviewed: Imaging: Low-dose screening CT 11/21/2018-subcentimeter lung nodules measuring up to 2.7 mm, mild bronchial wall thickening, mild emphysema.  Reviewed images personally.  PFTs:  Sleep: Download 08/18/2023 CPAP setting 10-20 cmH2O, 100% compliance Residual AHI 0.4, minimal air leaks and central events.  Assessment and Plan Assessment & Plan Severe sleep apnea Severe sleep apnea diagnosed over 30 years ago with a lab sleep study. Currently using an auto set CPAP with settings of 10 to 20 cm H2O. Demonstrates 100% compliance with CPAP usage, resulting in a residual AHI of 0.4, indicating effective management of sleep apnea. Insurance requires annual doctor visits to verify compliance for supply coverage. - Document 100% compliance with CPAP usage for insurance purposes - Send documentation to insurance for supply coverage - Schedule follow-up with Dr. Gaynell Keeler, sleep specialist, in one year  Emphysema Emphysema secondary to a 30 pack-year smoking history, with cessation in November 2016. Experiences dyspnea, particularly in humid conditions, but does not require daily inhaler use. Prefers albuterol  for as-needed relief and reports effective symptom management with it. - Order annual screening CT for lung cancer due to smoking history - Prescribe albuterol  inhaler for as-needed use  Recommendations: Reorder  CPAP supplies Albuterol  as needed Refer for screening CT chest  Phyllis Breeze MD [facility] 08/21/2023, 10:44 AM  CC: [name] Guerin, DO

## 2023-08-21 NOTE — Addendum Note (Signed)
 Addended by: Luana Rumple R on: 08/21/2023 10:55 AM   Modules accepted: Orders

## 2023-08-21 NOTE — Patient Instructions (Addendum)
 VISIT SUMMARY:  Today, you came in for a follow-up visit to obtain CPAP supplies for your severe sleep apnea. We discussed your long-standing history of sleep apnea and your excellent compliance with CPAP therapy. We also reviewed your history of emphysema and your current management plan.  YOUR PLAN:  -SEVERE SLEEP APNEA: Severe sleep apnea is a condition where your breathing repeatedly stops and starts during sleep. You have been managing this condition effectively with a CPAP machine, and your compliance is excellent. We will document your 100% compliance and send this information to your insurance to ensure continued coverage for your CPAP supplies. You should follow up with Dr. Dionicia Frater, your sleep specialist, in one year.  -EMPHYSEMA: Emphysema is a lung condition that causes shortness of breath and is often related to smoking. You have a history of smoking but quit in November 2016. You manage your symptoms with an albuterol  inhaler as needed, especially in humid conditions. We will order an annual screening CT for lung cancer due to your smoking history and continue your prescription for the albuterol  inhaler.  INSTRUCTIONS:  Please schedule a follow-up appointment with Dr. Gaynell Keeler, your sleep specialist, in one year. Additionally, make sure to get your annual screening CT for lung cancer as ordered.

## 2023-08-22 ENCOUNTER — Telehealth: Payer: Self-pay

## 2023-08-22 NOTE — Progress Notes (Signed)
 Subjective: CC:DM PCP: Scott Guerin, DO ZOX:Scott [name] is a 68 y.o. male presenting to clinic today for:  1. Type 2 Diabetes with hypertension, hyperlipidemia:  Compliant with medications.  Taking Farxiga , metformin , Ozempic , Crestor  and lisinopril .  He reports no hypoglycemic episodes.  He admits to having eaten ice cream on his vacation recently  Diabetes Health Maintenance Due  Topic Date Due   FOOT EXAM  10/24/2023   HEMOGLOBIN A1C  10/24/2023   OPHTHALMOLOGY EXAM  04/24/2024    Last A1c:  Lab Results  Component Value Date   HGBA1C 7.0 (H) 04/26/2023    ROS: Denies dizziness, LOC, polyuria, polydipsia, unintended weight loss/gain, foot ulcerations, numbness or tingling in extremities, shortness of breath or chest pain.  2.  Cough He had some type of viral URI and had some cough Perles leftover and would like to get a new set of these because he is still at the tail end of the infection.  No hemoptysis, fevers or unplanned weight loss  ROS: Per HPI  Allergies  Allergen Reactions   Augmentin [Amoxicillin-Pot Clavulanate] Itching and Nausea And Vomiting   Codeine Itching and Nausea And Vomiting   Penicillins Hives and Itching    Has patient had a PCN reaction causing immediate rash, facial/tongue/throat swelling, SOB or lightheadedness with hypotension: No Has patient had a PCN reaction causing severe rash involving mucus membranes or skin necrosis: No Has patient had a PCN reaction that required hospitalization No Has patient had a PCN reaction occurring within the last 10 years: No If all of the above answers are NO, then may proceed with Cephalosporin use.    Atorvastatin      Myalgias and fatigue   Oxycodone Hcl Hives, Itching and Other (See Comments)   Past Medical History:  Diagnosis Date   Allergy    Anxiety    Arthritis    Asbestosis (HCC)    Asthma    Diabetes mellitus without complication (HCC)    Dyslipidemia    GERD (gastroesophageal  reflux disease)    Numbness    right side of tongue since 2016 surgery   Sleep apnea    uses cpap   Tobacco abuse     Current Outpatient Medications:    albuterol  (VENTOLIN  HFA) 108 (90 Base) MCG/ACT inhaler, Inhale 2 puffs into the lungs every 6 (six) hours as needed for wheezing., Disp: 1 each, Rfl: 3   aspirin  81 MG tablet, Take 1 tablet (81 mg total) by mouth daily., Disp: 30 tablet, Rfl:    azithromycin  (ZITHROMAX ) 250 MG tablet, Take 250 mg by mouth as directed. (Patient not taking: Reported on 08/21/2023), Disp: , Rfl:    budesonide -formoterol  (SYMBICORT ) 160-4.5 MCG/ACT inhaler, Inhale 2 puffs into the lungs 2 (two) times daily., Disp: 30.6 g, Rfl: 12   clotrimazole -betamethasone  (LOTRISONE ) cream, Apply 1 Application topically 2 (two) times daily. To left shoulder (Patient not taking: Reported on 08/21/2023), Disp: 30 g, Rfl: 0   cycloSPORINE  (RESTASIS ) 0.05 % ophthalmic emulsion, Place 1 drop into both eyes 2 (two) times daily. , Disp: , Rfl:    dapagliflozin  propanediol (FARXIGA ) 10 MG TABS tablet, Take 1 tablet (10 mg total) by mouth daily before breakfast., Disp: 90 tablet, Rfl: 4   diclofenac  sodium (VOLTAREN ) 1 % GEL, Apply 4 g topically 4 (four) times daily. (Patient not taking: Reported on 08/21/2023), Disp: 350 g, Rfl: 1   linaclotide  (LINZESS ) 72 MCG capsule, Take 1 capsule (72 mcg total) by mouth daily before breakfast., Disp: ,  Rfl:    lisinopril  (ZESTRIL ) 5 MG tablet, Take 1 tablet (5 mg total) by mouth daily., Disp: 90 tablet, Rfl: 3   loratadine (CLARITIN) 10 MG tablet, Take 10 mg by mouth daily as needed for allergies., Disp: , Rfl:    metFORMIN  (GLUCOPHAGE ) 500 MG tablet, Take 2 tablets (1,000 mg total) by mouth 2 (two) times daily with a meal., Disp: 360 tablet, Rfl: 3   Multiple Vitamins-Minerals (CENTRUM SILVER 50+MEN PO), Take by mouth. (Patient not taking: Reported on 08/21/2023), Disp: , Rfl:    naproxen  sodium (ALEVE ) 220 MG tablet, Take 440 mg by mouth 2 (two)  times daily as needed (pain)., Disp: , Rfl:    omeprazole  (PRILOSEC) 40 MG capsule, Take 1 capsule (40 mg total) by mouth daily., Disp: 90 capsule, Rfl: 3   rosuvastatin  (CRESTOR ) 10 MG tablet, Take 1 tablet (10 mg total) by mouth daily., Disp: 90 tablet, Rfl: 3   Semaglutide , 2 MG/DOSE, 8 MG/3ML SOPN, Inject 2 mg as directed once a week. Via [employer] patient assistance program, Disp: 9 mL, Rfl: 0   vitamin E 180 MG (400 UNITS) capsule, Take 800 Units by mouth daily. (Patient not taking: Reported on 08/21/2023), Disp: , Rfl:  Social History   Socioeconomic History   Marital status: Married    Spouse name: Not on file   Number of children: 2   Years of education: Not on file   Highest education level: Associate degree: occupational, Scientist, product/process development, or vocational program  Occupational History   Occupation: Ecologist: SELF EMPLOYED  Tobacco Use   Smoking status: Former    Current packs/day: 0.00    Types: Cigarettes    Quit date: 01/17/2015    Years since quitting: 8.6   Smokeless tobacco: Never   Tobacco comments:    He has patches.  Vaping Use   Vaping status: Never Used  Substance and Sexual Activity   Alcohol use: Not Currently    Comment: social   Drug use: No   Sexual activity: Not on file  Other Topics Concern   Not on file  Social History Narrative   Lives at home with wife.    Social Drivers of Health   Financial Resource Strain: Medium Risk (04/26/2023)   Overall Financial Resource Strain (CARDIA)    Difficulty of Paying Living Expenses: Somewhat hard  Food Insecurity: No Food Insecurity (04/26/2023)   Hunger Vital Sign    Worried About Running Out of Food in the Last Year: Never true    Ran Out of Food in the Last Year: Never true  Transportation Needs: No Transportation Needs (04/26/2023)   PRAPARE - Administrator, Civil Service (Medical): No    Lack of Transportation (Non-Medical): No  Physical Activity: Insufficiently Active (04/26/2023)    Exercise Vital Sign    Days of Exercise per Week: 2 days    Minutes of Exercise per Session: 30 min  Stress: No Stress Concern Present (04/26/2023)   [employer] of Occupational Health - Occupational Stress Questionnaire    Feeling of Stress : Only a little  Social Connections: Unknown (04/26/2023)   Social Connection and Isolation Panel    Frequency of Communication with Friends and Family: More than three times a week    Frequency of Social Gatherings with Friends and Family: More than three times a week    Attends Religious Services: Patient declined    Active Member of Clubs or Organizations: No    Attends  Club or Organization Meetings: Never    Marital Status: Married  Catering manager Violence: Not At Risk (08/29/2022)   Humiliation, Afraid, Rape, and Kick questionnaire    Fear of Current or Ex-Partner: No    Emotionally Abused: No    Physically Abused: No    Sexually Abused: No   Family History  Problem Relation Age of Onset   Stroke Father        Died age 17   CAD Father    CAD Brother 67       CABG    Objective: Office vital signs reviewed. BP 112/69   Pulse 70   Temp 98.7 F (37.1 C)   Ht 5' 11 (1.803 m)   Wt 202 lb (91.6 kg)   SpO2 99%   BMI 28.17 kg/m   Physical Examination:  General: Awake, alert, well nourished, No acute distress HEENT: sclera white, MMM Cardio: regular rate and rhythm, S1S2 heard, no murmurs appreciated Pulm: clear to auscultation bilaterally, no wheezes, rhonchi or rales; normal work of breathing on room air  Assessment/ Plan: 67 y.o. male   Diabetes mellitus treated with injections of non-insulin medication (HCC) - Plan: Bayer DCA Hb A1c Waived, Semaglutide , 2 MG/DOSE, 8 MG/3ML SOPN, dapagliflozin  propanediol (FARXIGA ) 10 MG TABS tablet  Hyperlipidemia associated with type 2 diabetes mellitus (HCC) - Plan: Lipid Panel  Hypertension associated with diabetes (HCC)  Subacute cough - Plan: benzonatate  (TESSALON ) 200 MG  capsule  Sugar controlled 6.3.  No changes. Meds renewed.  PAP for Ozempic  still but makes too much for Farxiga  now.  BP controlled. No changes  Continue statin. Check lipid  Tessalon  renewed.  Follow up in 36m  Scott Rayford Bambi Bonine, DO [facility] [phone]

## 2023-08-22 NOTE — Telephone Encounter (Signed)
Lm x2 for pt. Will close encounter per office protocol.

## 2023-08-22 NOTE — Telephone Encounter (Signed)
 Copied from CRM [id number]. Topic: General - Other >> Aug 22, 2023  2:01 PM Corean Deutscher wrote: Reason for CRM: Patient called returning [facility] phone call, contacted [facility] spoke with Lizabeth Riggs currently available. Advised patient [name] will follow up with him. Patient was thankful and verbalized understanding.  Please refer to 08/12/2023 phone note. Pt had an appt with Dr. Waylan Haggard 08/21/2023. Will need to verify that all cpap questions were answered.  Lm x1 for pt.

## 2023-08-22 NOTE — Telephone Encounter (Signed)
 Spoke to patient and verified that all questions were answered during OV.   Dr. Waylan Haggard, please sign cpap supplies order. Thanks

## 2023-08-23 ENCOUNTER — Telehealth: Payer: Self-pay | Admitting: Pharmacist

## 2023-08-23 ENCOUNTER — Encounter: Payer: Self-pay | Admitting: Family Medicine

## 2023-08-23 ENCOUNTER — Ambulatory Visit (INDEPENDENT_AMBULATORY_CARE_PROVIDER_SITE_OTHER): Payer: Medicare Other | Admitting: Family Medicine

## 2023-08-23 VITALS — BP 112/69 | HR 70 | Temp 98.7°F | Ht 71.0 in | Wt 202.0 lb

## 2023-08-23 DIAGNOSIS — E785 Hyperlipidemia, unspecified: Secondary | ICD-10-CM

## 2023-08-23 DIAGNOSIS — R052 Subacute cough: Secondary | ICD-10-CM | POA: Diagnosis not present

## 2023-08-23 DIAGNOSIS — Z7985 Long-term (current) use of injectable non-insulin antidiabetic drugs: Secondary | ICD-10-CM | POA: Diagnosis not present

## 2023-08-23 DIAGNOSIS — E1159 Type 2 diabetes mellitus with other circulatory complications: Secondary | ICD-10-CM

## 2023-08-23 DIAGNOSIS — E1169 Type 2 diabetes mellitus with other specified complication: Secondary | ICD-10-CM

## 2023-08-23 DIAGNOSIS — I152 Hypertension secondary to endocrine disorders: Secondary | ICD-10-CM

## 2023-08-23 DIAGNOSIS — E119 Type 2 diabetes mellitus without complications: Secondary | ICD-10-CM | POA: Diagnosis not present

## 2023-08-23 LAB — LIPID PANEL
Chol/HDL Ratio: 2.3 ratio (ref 0.0–5.0)
Cholesterol, Total: 97 mg/dL — ABNORMAL LOW (ref 100–199)
HDL: 43 mg/dL (ref >39)
LDL Chol Calc (NIH): 40 mg/dL (ref 0–99)
Triglycerides: 62 mg/dL (ref 0–149)
VLDL Cholesterol Cal: 14 mg/dL (ref 5–40)

## 2023-08-23 LAB — BAYER DCA HB A1C WAIVED: HB A1C (BAYER DCA - WAIVED): 6.3 % — ABNORMAL HIGH (ref 4.8–5.6)

## 2023-08-23 MED ORDER — BENZONATATE 200 MG PO CAPS: 200.0000 mg | ORAL_CAPSULE | Freq: Two times a day (BID) | ORAL | 1 refills | Status: DC | PRN

## 2023-08-23 MED ORDER — SEMAGLUTIDE (2 MG/DOSE) 8 MG/3ML ~~LOC~~ SOPN
2.0000 mg | PEN_INJECTOR | SUBCUTANEOUS | 4 refills | Status: DC
Start: 2023-08-23 — End: 2024-02-05

## 2023-08-23 MED ORDER — DAPAGLIFLOZIN PROPANEDIOL 10 MG PO TABS
10.0000 mg | ORAL_TABLET | Freq: Every day | ORAL | 4 refills | Status: DC
Start: 2023-08-23 — End: 2024-02-05

## 2023-08-23 NOTE — Telephone Encounter (Signed)
 Patient states he got a letter stating he needs new refills for Ozempic  PAP.  I see this: Let me know if action is needed. Thank you!

## 2023-08-26 ENCOUNTER — Ambulatory Visit: Payer: Self-pay | Admitting: Family Medicine

## 2023-08-29 ENCOUNTER — Other Ambulatory Visit: Payer: Self-pay

## 2023-08-29 ENCOUNTER — Telehealth: Payer: Self-pay

## 2023-08-29 DIAGNOSIS — G4733 Obstructive sleep apnea (adult) (pediatric): Secondary | ICD-10-CM | POA: Diagnosis not present

## 2023-08-29 NOTE — Telephone Encounter (Signed)
 When order is signed I will send. NFN

## 2023-08-29 NOTE — Telephone Encounter (Signed)
 Copied from CRM [id number]. Topic: Clinical - Order For Equipment >> Aug 28, 2023  2:52 PM Isabell A wrote: Patient calling states he only needs supplies - not the actual machine.

## 2023-08-29 NOTE — Telephone Encounter (Signed)
 Placed order for new Cpap machine ( request by [employer])

## 2023-10-07 ENCOUNTER — Telehealth: Payer: Self-pay

## 2023-10-07 NOTE — Telephone Encounter (Signed)
 Called to inform patient that Ozempic  has arrived in office, no answer. Mychart message sent.

## 2023-10-08 NOTE — Telephone Encounter (Signed)
 I called and spoke with patient and made him aware that his Ozempic  is ready to be picked up. Patient said he would get his daughter to come by the office and pick it up.

## 2023-12-11 ENCOUNTER — Ambulatory Visit

## 2023-12-11 ENCOUNTER — Telehealth: Payer: Self-pay

## 2023-12-11 VITALS — BP 112/69 | HR 70 | Ht 71.0 in | Wt 202.0 lb

## 2023-12-11 DIAGNOSIS — Z0000 Encounter for general adult medical examination without abnormal findings: Secondary | ICD-10-CM | POA: Diagnosis not present

## 2023-12-11 NOTE — Telephone Encounter (Signed)
 During my awv w/pt today. Pt requested that Dr. Veola put in a full panel of lab work to be drawn. Pt felt that he has not had a full work up for labs, ie PSA. Pt would like to have his lab work done be his ov in 12/25 w/pcp.   Any questions pls call pt.   Thank you

## 2023-12-11 NOTE — Progress Notes (Signed)
 Subjective:   Scott [name] is a 68 y.o. who presents for a Medicare Wellness preventive visit.  As a reminder, Annual Wellness Visits don't include a physical exam, and some assessments may be limited, especially if this visit is performed virtually. We may recommend an in-person follow-up visit with your provider if needed.  Visit Complete: Virtual I connected with  Scott [name] on 12/11/23 by a audio enabled telemedicine application and verified that I am speaking with the correct person using two identifiers.  Patient Location: Home  Provider Location: Home Office  I discussed the limitations of evaluation and management by telemedicine. The patient expressed understanding and agreed to proceed.  Vital Signs: Because this visit was a virtual/telehealth visit, some criteria may be missing or patient reported. Any vitals not documented were not able to be obtained and vitals that have been documented are patient reported.  VideoDeclined- This patient declined Librarian, academic. Therefore the visit was completed with audio only.  Persons Participating in Visit: Patient.  AWV Questionnaire: No: Patient Medicare AWV questionnaire was not completed prior to this visit.  Cardiac Risk Factors include: advanced age (>53men, >70 women);diabetes mellitus;dyslipidemia;hypertension;male gender;smoking/ tobacco exposure     Objective:    Today's Vitals   12/11/23 1131  BP: 112/69  Pulse: 70  Weight: 202 lb (91.6 kg)  Height: 5' 11 (1.803 m)   Body mass index is 28.17 kg/m.     12/11/2023   11:34 AM 08/23/2023    9:26 AM 08/29/2022    1:49 PM 02/16/2022   10:06 AM 08/23/2021   10:45 AM 10/07/2020   11:16 AM 01/10/2016    5:57 AM  Advanced Directives  Does Patient Have a Medical Advance Directive? Yes Yes Yes No No No No   Type of Estate agent of [place];Living will Healthcare Power of [place];Living will Healthcare Power  of [place];Living will      Does patient want to make changes to medical advance directive?  No - Patient declined       Copy of Healthcare Power of Attorney in Chart? No - copy requested Yes - validated most recent copy scanned in chart (See row information) No - copy requested      Would patient like information on creating a medical advance directive?    Yes (MAU/Ambulatory/Procedural Areas - Information given) No - Patient declined Yes (MAU/Ambulatory/Procedural Areas - Information given) No - patient declined information      Data saved with a previous flowsheet row definition    Current Medications (verified) Outpatient Encounter Medications as of 12/11/2023  Medication Sig   albuterol  (VENTOLIN  HFA) 108 (90 Base) MCG/ACT inhaler Inhale 2 puffs into the lungs every 6 (six) hours as needed for wheezing.   aspirin  81 MG tablet Take 1 tablet (81 mg total) by mouth daily.   benzonatate  (TESSALON ) 200 MG capsule Take 1 capsule (200 mg total) by mouth 2 (two) times daily as needed for cough.   budesonide -formoterol  (SYMBICORT ) 160-4.5 MCG/ACT inhaler Inhale 2 puffs into the lungs 2 (two) times daily.   clotrimazole -betamethasone  (LOTRISONE ) cream Apply 1 Application topically 2 (two) times daily. To left shoulder   cycloSPORINE  (RESTASIS ) 0.05 % ophthalmic emulsion Place 1 drop into both eyes 2 (two) times daily.    dapagliflozin  propanediol (FARXIGA ) 10 MG TABS tablet Take 1 tablet (10 mg total) by mouth daily before breakfast.   diclofenac  sodium (VOLTAREN ) 1 % GEL Apply 4 g topically 4 (four) times daily.   linaclotide  (  LINZESS ) 72 MCG capsule Take 1 capsule (72 mcg total) by mouth daily before breakfast.   lisinopril  (ZESTRIL ) 5 MG tablet Take 1 tablet (5 mg total) by mouth daily.   loratadine (CLARITIN) 10 MG tablet Take 10 mg by mouth daily as needed for allergies.   metFORMIN  (GLUCOPHAGE ) 500 MG tablet Take 2 tablets (1,000 mg total) by mouth 2 (two) times daily with a meal.   Multiple  Vitamins-Minerals (CENTRUM SILVER 50+MEN PO) Take by mouth.   naproxen  sodium (ALEVE ) 220 MG tablet Take 440 mg by mouth 2 (two) times daily as needed (pain).   omeprazole  (PRILOSEC) 40 MG capsule Take 1 capsule (40 mg total) by mouth daily.   rosuvastatin  (CRESTOR ) 10 MG tablet Take 1 tablet (10 mg total) by mouth daily.   Semaglutide , 2 MG/DOSE, 8 MG/3ML SOPN Inject 2 mg as directed every 7 (seven) days. Via novo nordisk patient assistance program   No facility-administered encounter medications on file as of 12/11/2023.    Allergies (verified) Augmentin [amoxicillin-pot clavulanate], Codeine, Penicillins, Atorvastatin , and Oxycodone hcl   History: Past Medical History:  Diagnosis Date   Allergy    Anxiety    Arthritis    Asbestosis (HCC)    Asthma    Diabetes mellitus without complication (HCC)    Dyslipidemia    GERD (gastroesophageal reflux disease)    Numbness    right side of tongue since 2016 surgery   Sleep apnea    uses cpap   Tobacco abuse    Past Surgical History:  Procedure Laterality Date   CHOLECYSTECTOMY N/A 01/10/2016   Procedure: LAPAROSCOPIC CHOLECYSTECTOMY;  Surgeon: Krystal Spinner, MD;  Location: [facility];  Service: General;  Laterality: N/A;   EYE SURGERY Bilateral    cataract surgery with lens implants   FOOT MASS EXCISION Right 35 yrs ago   KYPHOPLASTY N/A 10/10/2020   Procedure: Lumbar Two Kyphoplasty;  Surgeon: Colon Shove, MD;  Location: [facility] OR;  Service: Neurosurgery;  Laterality: N/A;   right knuckle replaced  11/30/2014   saliva gland removed  04/2014   dr merilee gore   SPINE SURGERY     VASECTOMY     Family History  Problem Relation Age of Onset   Stroke Father        Died age 68   CAD Father    CAD Brother 36       CABG   Social History   Socioeconomic History   Marital status: Married    Spouse name: Not on file   Number of children: 2   Years of education: Not on file   Highest education level: Associate degree: occupational,  Scientist, product/process development, or vocational program  Occupational History   Occupation: Ecologist: SELF EMPLOYED  Tobacco Use   Smoking status: Former    Current packs/day: 0.00    Types: Cigarettes    Quit date: 01/17/2015    Years since quitting: 8.9   Smokeless tobacco: Never   Tobacco comments:    He has patches.  Vaping Use   Vaping status: Never Used  Substance and Sexual Activity   Alcohol use: Not Currently    Comment: social   Drug use: No   Sexual activity: Not on file  Other Topics Concern   Not on file  Social History Narrative   Lives at home with wife.    Social Drivers of Health   Financial Resource Strain: Medium Risk (12/11/2023)   Overall Financial Resource Strain (CARDIA)  Difficulty of Paying Living Expenses: Somewhat hard  Food Insecurity: No Food Insecurity (12/11/2023)   Hunger Vital Sign    Worried About Running Out of Food in the Last Year: Never true    Ran Out of Food in the Last Year: Never true  Transportation Needs: No Transportation Needs (12/11/2023)   PRAPARE - Administrator, Civil Service (Medical): No    Lack of Transportation (Non-Medical): No  Physical Activity: Insufficiently Active (12/11/2023)   Exercise Vital Sign    Days of Exercise per Week: 2 days    Minutes of Exercise per Session: 30 min  Stress: No Stress Concern Present (12/11/2023)   [employer] of Occupational Health - Occupational Stress Questionnaire    Feeling of Stress: Only a little  Social Connections: Moderately Isolated (12/11/2023)   Social Connection and Isolation Panel    Frequency of Communication with Friends and Family: More than three times a week    Frequency of Social Gatherings with Friends and Family: More than three times a week    Attends Religious Services: Patient declined    Database administrator or Organizations: No    Attends Banker Meetings: Never    Marital Status: Married    Tobacco Counseling Counseling given:  Yes Tobacco comments: He has patches.    Clinical Intake:  Pre-visit preparation completed: Yes  Pain : No/denies pain     Nutritional Risks: None Diabetes: Yes  Lab Results  Component Value Date   HGBA1C 6.3 (H) 08/23/2023   HGBA1C 7.0 (H) 04/26/2023   HGBA1C 6.5 (H) 10/24/2022     How often do you need to have someone help you when you read instructions, pamphlets, or other written materials from your doctor or pharmacy?: 1 - Never  Interpreter Needed?: No  Information entered by :: alia t/cma   Activities of Daily Living     12/11/2023   11:32 AM  In your present state of health, do you have any difficulty performing the following activities:  Hearing? 0  Vision? 0  Difficulty concentrating or making decisions? 0  Walking or climbing stairs? 0  Dressing or bathing? 0  Doing errands, shopping? 0  Preparing Food and eating ? N  Using the Toilet? N  In the past six months, have you accidently leaked urine? N  Do you have problems with loss of bowel control? N  Managing your Medications? N  Managing your Finances? N  Housekeeping or managing your Housekeeping? N    Patient Care Team: Jolinda Norene HERO, DO as PCP - General (Family Medicine) Rosalie Kitchens, MD as Consulting Physician (Gastroenterology) Ruthell Lauraine FALCON, NP as Nurse Practitioner (Pulmonary Disease) Ladora Ross Lacy Phebe, MD as Referring Physician (Optometry) Billee Mliss BIRCH, [facility] as Pharmacist (Family Medicine)  I have updated your Care Teams any recent Medical Services you may have received from other providers in the past year.     Assessment:   This is a routine wellness examination for [facility].  Hearing/Vision screen Hearing Screening - Comments:: Pt have hearing dif Vision Screening - Comments:: Pt wear glasses to read/pt goes to Dr. Jama, [place] in [place],/last ov 2025   Goals Addressed             This Visit's Progress    Exercise 3x per week (30 min per time)   On track     Continue to exercise and stay healthy.       Depression Screen  12/11/2023   11:34 AM 08/23/2023    9:25 AM 04/26/2023    1:30 PM 10/24/2022    9:28 AM 08/29/2022    1:48 PM 02/16/2022    9:53 AM 01/10/2022   11:05 AM  PHQ 2/9 Scores  PHQ - 2 Score 0 0 0 0 0 0 0  PHQ- 9 Score  0 1        Fall Risk     12/11/2023   11:30 AM 08/23/2023    9:25 AM 04/26/2023    1:29 PM 10/24/2022    9:28 AM 08/29/2022    1:47 PM  Fall Risk   Falls in the past year? 0 0 0 0 0  Number falls in past yr: 0 0 0 0 0  Injury with Fall? 0 0 0 0 0  Risk for fall due to : No Fall Risks No Fall Risks No Fall Risks No Fall Risks No Fall Risks  Follow up Falls evaluation completed Falls evaluation completed Falls evaluation completed;Education provided Falls prevention discussed Falls prevention discussed    MEDICARE RISK AT HOME:  Medicare Risk at Home Any stairs in or around the home?: Yes If so, are there any without handrails?: Yes Home free of loose throw rugs in walkways, pet beds, electrical cords, etc?: Yes Adequate lighting in your home to reduce risk of falls?: Yes Life alert?: No Use of a cane, walker or w/c?: No Grab bars in the bathroom?: Yes Shower chair or bench in shower?: Yes Elevated toilet seat or a handicapped toilet?: Yes  TIMED UP AND GO:  Was the test performed?  no  Cognitive Function: 6CIT completed    08/23/2023    9:28 AM  MMSE - Mini Mental State Exam  Orientation to time 5  Orientation to Place 5  Registration 3  Attention/ Calculation 5  Recall 3  Language- name 2 objects 2  Language- repeat 1  Language- follow 3 step command 3  Language- read & follow direction 1  Write a sentence 1  Copy design 1  Total score 30        12/11/2023   11:36 AM 08/29/2022    1:49 PM 08/23/2021   10:50 AM  6CIT Screen  What Year? 0 points 0 points 0 points  What month? 0 points 0 points 0 points  What time? 0 points 0 points 0 points  Count back from 20 0 points 0 points 0  points  Months in reverse 0 points 0 points 0 points  Repeat phrase 0 points 0 points 0 points  Total Score 0 points 0 points 0 points    Immunizations Immunization History  Administered Date(s) Administered   Fluad Quad(high Dose 65+) 01/10/2022   Influenza,inj,Quad PF,6+ Mos 12/29/2013, 12/30/2014, 02/14/2016, 01/16/2017, 01/24/2018, 03/09/2019   PFIZER(Purple Top)SARS-COV-2 Vaccination 10/23/2019   Pneumococcal Conjugate-13 08/22/2020   Pneumococcal Polysaccharide-23 03/12/2009, 03/17/2021   Tdap 12/30/2014   Zoster Recombinant(Shingrix ) 03/17/2021, 08/31/2021   Zoster, Live 02/22/2012    Screening Tests Health Maintenance  Topic Date Due   Influenza Vaccine  10/11/2023   FOOT EXAM  10/24/2023   COVID-19 Vaccine (2 - 2025-26 season) 11/11/2023   HEMOGLOBIN A1C  02/22/2024   OPHTHALMOLOGY EXAM  04/24/2024   Diabetic kidney evaluation - eGFR measurement  04/25/2024   Diabetic kidney evaluation - Urine ACR  04/25/2024   Medicare Annual Wellness (AWV)  12/10/2024   DTaP/Tdap/Td (2 - Td or Tdap) 12/29/2024   Colonoscopy  02/27/2028   Pneumococcal Vaccine: 50+  Years  Completed   Hepatitis C Screening  Completed   Zoster Vaccines- Shingrix   Completed   HPV VACCINES  Aged Out   Meningococcal B Vaccine  Aged Out    Health Maintenance Items Addressed: See Nurse Notes at the end of this note  Additional Screening:  Vision Screening: Recommended annual ophthalmology exams for early detection of glaucoma and other disorders of the eye. Is the patient up to date with their annual eye exam?  Yes  Who is the provider or what is the name of the office in which the patient attends annual eye exams? [facility] in [facility]  Dental Screening: Recommended annual dental exams for proper oral hygiene  Community Resource Referral / Chronic Care Management: CRR required this visit?  No   CCM required this visit?  No   Plan:    I have personally reviewed and noted the following in  the patient's chart:   Medical and social history Use of alcohol, tobacco or illicit drugs  Current medications and supplements including opioid prescriptions. Patient is not currently taking opioid prescriptions. Functional ability and status Nutritional status Physical activity Advanced directives List of other physicians Hospitalizations, surgeries, and ER visits in previous 12 months Vitals Screenings to include cognitive, depression, and falls Referrals and appointments  In addition, I have reviewed and discussed with patient certain preventive protocols, quality metrics, and best practice recommendations. A written personalized care plan for preventive services as well as general preventive health recommendations were provided to patient.   Marwan Lipe, CMA   12/11/2023   After Visit Summary: (MyChart) Due to this being a telephonic visit, the after visit summary with patients personalized plan was offered to patient via MyChart   Notes: Nothing significant to report at this time.

## 2023-12-11 NOTE — Patient Instructions (Signed)
 Mr. Scallon,  Thank you for taking the time for your Medicare Wellness Visit. I appreciate your continued commitment to your health goals. Please review the care plan we discussed, and feel free to reach out if I can assist you further.  Medicare recommends these wellness visits once per year to help you and your care team stay ahead of potential health issues. These visits are designed to focus on prevention, allowing your provider to concentrate on managing your acute and chronic conditions during your regular appointments.  Please note that Annual Wellness Visits do not include a physical exam. Some assessments may be limited, especially if the visit was conducted virtually. If needed, we may recommend a separate in-person follow-up with your provider.  Ongoing Care Seeing your primary care provider every 3 to 6 months helps us  monitor your health and provide consistent, personalized care.   Referrals If a referral was made during today's visit and you haven't received any updates within two weeks, please contact the referred provider directly to check on the status.  Recommended Screenings:  Health Maintenance  Topic Date Due   Medicare Annual Wellness Visit  08/29/2023   Flu Shot  10/11/2023   Complete foot exam   10/24/2023   COVID-19 Vaccine (2 - 2025-26 season) 11/11/2023   Hemoglobin A1C  02/22/2024   Eye exam for diabetics  04/24/2024   Yearly kidney function blood test for diabetes  04/25/2024   Yearly kidney health urinalysis for diabetes  04/25/2024   DTaP/Tdap/Td vaccine (2 - Td or Tdap) 12/29/2024   Colon Cancer Screening  02/27/2028   Pneumococcal Vaccine for age over 86  Completed   Hepatitis C Screening  Completed   Zoster (Shingles) Vaccine  Completed   HPV Vaccine  Aged Out   Meningitis B Vaccine  Aged Out       12/11/2023   11:34 AM  Advanced Directives  Does Patient Have a Medical Advance Directive? Yes  Type of Estate agent of  [place];Living will  Copy of Healthcare Power of Attorney in Chart? No - copy requested   Advance Care Planning is important because it: Ensures you receive medical care that aligns with your values, goals, and preferences. Provides guidance to your family and loved ones, reducing the emotional burden of decision-making during critical moments.  Vision: Annual vision screenings are recommended for early detection of glaucoma, cataracts, and diabetic retinopathy. These exams can also reveal signs of chronic conditions such as diabetes and high blood pressure.  Dental: Annual dental screenings help detect early signs of oral cancer, gum disease, and other conditions linked to overall health, including heart disease and diabetes.  Please see the attached documents for additional preventive care recommendations.

## 2023-12-12 ENCOUNTER — Other Ambulatory Visit: Payer: Self-pay

## 2023-12-13 NOTE — Telephone Encounter (Signed)
 Left message for patient to call back so we can get appointment scheduled for annual physical.

## 2023-12-13 NOTE — Telephone Encounter (Signed)
 This would be because he has not scheduled a CPE in years.  Please help him make appropriate appointment so we can order appropriate labs.

## 2024-01-09 ENCOUNTER — Telehealth: Payer: Self-pay

## 2024-01-09 NOTE — Telephone Encounter (Signed)
 In process of completing Novo Nordisk refills for patients OZEMPIC  2MG  DOSE PEN medication.  Application with Julie P for completion.

## 2024-01-09 NOTE — Telephone Encounter (Signed)
 Faxed refills for novo nordisk.

## 2024-01-29 ENCOUNTER — Encounter: Payer: Self-pay | Admitting: Family Medicine

## 2024-01-30 ENCOUNTER — Other Ambulatory Visit: Payer: Self-pay

## 2024-01-30 NOTE — Telephone Encounter (Signed)
Please place future lab orders for patient.

## 2024-01-31 ENCOUNTER — Telehealth: Payer: Self-pay | Admitting: Family Medicine

## 2024-01-31 DIAGNOSIS — N400 Enlarged prostate without lower urinary tract symptoms: Secondary | ICD-10-CM

## 2024-01-31 DIAGNOSIS — E1159 Type 2 diabetes mellitus with other circulatory complications: Secondary | ICD-10-CM

## 2024-01-31 DIAGNOSIS — E559 Vitamin D deficiency, unspecified: Secondary | ICD-10-CM

## 2024-01-31 DIAGNOSIS — E1169 Type 2 diabetes mellitus with other specified complication: Secondary | ICD-10-CM

## 2024-01-31 DIAGNOSIS — E119 Type 2 diabetes mellitus without complications: Secondary | ICD-10-CM

## 2024-01-31 NOTE — Addendum Note (Signed)
 Addended by: JOLINDA NORENE HERO on: 01/31/2024 04:33 PM   Modules accepted: Orders

## 2024-01-31 NOTE — Telephone Encounter (Signed)
 Patient has appt 11-24 with the lab for his physical with Dr. KANDICE 11-26. Please place orders.

## 2024-01-31 NOTE — Telephone Encounter (Signed)
 done

## 2024-02-03 ENCOUNTER — Other Ambulatory Visit

## 2024-02-03 DIAGNOSIS — E119 Type 2 diabetes mellitus without complications: Secondary | ICD-10-CM

## 2024-02-03 DIAGNOSIS — E1169 Type 2 diabetes mellitus with other specified complication: Secondary | ICD-10-CM

## 2024-02-03 DIAGNOSIS — E1159 Type 2 diabetes mellitus with other circulatory complications: Secondary | ICD-10-CM

## 2024-02-03 DIAGNOSIS — E559 Vitamin D deficiency, unspecified: Secondary | ICD-10-CM

## 2024-02-03 DIAGNOSIS — N400 Enlarged prostate without lower urinary tract symptoms: Secondary | ICD-10-CM

## 2024-02-03 LAB — BAYER DCA HB A1C WAIVED: HB A1C (BAYER DCA - WAIVED): 7.2 % — ABNORMAL HIGH (ref 4.8–5.6)

## 2024-02-04 ENCOUNTER — Encounter: Payer: Self-pay | Admitting: Family Medicine

## 2024-02-04 ENCOUNTER — Ambulatory Visit: Payer: Self-pay | Admitting: Family Medicine

## 2024-02-04 DIAGNOSIS — E559 Vitamin D deficiency, unspecified: Secondary | ICD-10-CM

## 2024-02-04 LAB — CBC WITH DIFFERENTIAL/PLATELET
Basophils Absolute: 0.1 x10E3/uL (ref 0.0–0.2)
Basos: 1 %
EOS (ABSOLUTE): 0.4 x10E3/uL (ref 0.0–0.4)
Eos: 6 %
Hematocrit: 43.5 % (ref 37.5–51.0)
Hemoglobin: 14.6 g/dL (ref 13.0–17.7)
Immature Grans (Abs): 0 x10E3/uL (ref 0.0–0.1)
Immature Granulocytes: 0 %
Lymphocytes Absolute: 1.6 x10E3/uL (ref 0.7–3.1)
Lymphs: 23 %
MCH: 31.3 pg (ref 26.6–33.0)
MCHC: 33.6 g/dL (ref 31.5–35.7)
MCV: 93 fL (ref 79–97)
Monocytes Absolute: 0.6 x10E3/uL (ref 0.1–0.9)
Monocytes: 8 %
Neutrophils Absolute: 4.5 x10E3/uL (ref 1.4–7.0)
Neutrophils: 62 %
Platelets: 166 x10E3/uL (ref 150–450)
RBC: 4.66 x10E6/uL (ref 4.14–5.80)
RDW: 12.7 % (ref 11.6–15.4)
WBC: 7.1 x10E3/uL (ref 3.4–10.8)

## 2024-02-04 LAB — CMP14+EGFR
ALT: 22 IU/L (ref 0–44)
AST: 18 IU/L (ref 0–40)
Albumin: 4.4 g/dL (ref 3.9–4.9)
Alkaline Phosphatase: 54 IU/L (ref 47–123)
BUN/Creatinine Ratio: 12 (ref 10–24)
BUN: 10 mg/dL (ref 8–27)
Bilirubin Total: 0.8 mg/dL (ref 0.0–1.2)
CO2: 24 mmol/L (ref 20–29)
Calcium: 9.1 mg/dL (ref 8.6–10.2)
Chloride: 100 mmol/L (ref 96–106)
Creatinine, Ser: 0.83 mg/dL (ref 0.76–1.27)
Globulin, Total: 2.2 g/dL (ref 1.5–4.5)
Glucose: 144 mg/dL — ABNORMAL HIGH (ref 70–99)
Potassium: 4.3 mmol/L (ref 3.5–5.2)
Sodium: 138 mmol/L (ref 134–144)
Total Protein: 6.6 g/dL (ref 6.0–8.5)
eGFR: 95 mL/min/1.73 (ref >59)

## 2024-02-04 LAB — LIPID PANEL
Chol/HDL Ratio: 2.1 ratio (ref 0.0–5.0)
Cholesterol, Total: 96 mg/dL — ABNORMAL LOW (ref 100–199)
HDL: 45 mg/dL (ref >39)
LDL Chol Calc (NIH): 34 mg/dL (ref 0–99)
Triglycerides: 81 mg/dL (ref 0–149)
VLDL Cholesterol Cal: 17 mg/dL (ref 5–40)

## 2024-02-04 LAB — VITAMIN D 25 HYDROXY (VIT D DEFICIENCY, FRACTURES): Vit D, 25-Hydroxy: 27.2 ng/mL — AB (ref 30.0–100.0)

## 2024-02-04 LAB — PSA: Prostate Specific Ag, Serum: 0.5 ng/mL (ref 0.0–4.0)

## 2024-02-04 LAB — TSH: TSH: 1.51 u[IU]/mL (ref 0.450–4.500)

## 2024-02-04 MED ORDER — VITAMIN D (ERGOCALCIFEROL) 1.25 MG (50000 UNIT) PO CAPS: 50000.0000 [IU] | ORAL_CAPSULE | ORAL | 0 refills | Status: AC

## 2024-02-05 ENCOUNTER — Encounter: Payer: Self-pay | Admitting: Family Medicine

## 2024-02-05 ENCOUNTER — Ambulatory Visit (INDEPENDENT_AMBULATORY_CARE_PROVIDER_SITE_OTHER): Payer: Self-pay | Admitting: Family Medicine

## 2024-02-05 VITALS — BP 119/73 | HR 96 | Temp 97.6°F | Ht 71.0 in | Wt 205.0 lb

## 2024-02-05 DIAGNOSIS — J441 Chronic obstructive pulmonary disease with (acute) exacerbation: Secondary | ICD-10-CM | POA: Diagnosis not present

## 2024-02-05 DIAGNOSIS — J438 Other emphysema: Secondary | ICD-10-CM | POA: Diagnosis not present

## 2024-02-05 DIAGNOSIS — I152 Hypertension secondary to endocrine disorders: Secondary | ICD-10-CM

## 2024-02-05 DIAGNOSIS — E785 Hyperlipidemia, unspecified: Secondary | ICD-10-CM

## 2024-02-05 DIAGNOSIS — Z0001 Encounter for general adult medical examination with abnormal findings: Secondary | ICD-10-CM

## 2024-02-05 DIAGNOSIS — J432 Centrilobular emphysema: Secondary | ICD-10-CM | POA: Diagnosis not present

## 2024-02-05 DIAGNOSIS — E1159 Type 2 diabetes mellitus with other circulatory complications: Secondary | ICD-10-CM

## 2024-02-05 DIAGNOSIS — Z0000 Encounter for general adult medical examination without abnormal findings: Secondary | ICD-10-CM

## 2024-02-05 DIAGNOSIS — E559 Vitamin D deficiency, unspecified: Secondary | ICD-10-CM

## 2024-02-05 DIAGNOSIS — N400 Enlarged prostate without lower urinary tract symptoms: Secondary | ICD-10-CM

## 2024-02-05 DIAGNOSIS — K219 Gastro-esophageal reflux disease without esophagitis: Secondary | ICD-10-CM

## 2024-02-05 DIAGNOSIS — Z7985 Long-term (current) use of injectable non-insulin antidiabetic drugs: Secondary | ICD-10-CM

## 2024-02-05 DIAGNOSIS — E1169 Type 2 diabetes mellitus with other specified complication: Secondary | ICD-10-CM

## 2024-02-05 LAB — VERITOR SARS-COV-2 AND FLU A+B
BD Veritor SARS-CoV-2 Ag: NEGATIVE
Influenza A: NEGATIVE
Influenza B: NEGATIVE

## 2024-02-05 MED ORDER — BENZONATATE 200 MG PO CAPS: 200.0000 mg | ORAL_CAPSULE | Freq: Two times a day (BID) | ORAL | 1 refills | Status: AC | PRN

## 2024-02-05 MED ORDER — BREZTRI AEROSPHERE 160-9-4.8 MCG/ACT IN AERO: 2.0000 | INHALATION_SPRAY | Freq: Two times a day (BID) | RESPIRATORY_TRACT | 11 refills | Status: AC

## 2024-02-05 MED ORDER — METHYLPREDNISOLONE ACETATE 80 MG/ML IJ SUSP
40.0000 mg | Freq: Once | INTRAMUSCULAR | Status: AC
  Administered 2024-02-05: 40 mg via INTRAMUSCULAR

## 2024-02-05 MED ORDER — OMEPRAZOLE 40 MG PO CPDR: 40.0000 mg | DELAYED_RELEASE_CAPSULE | Freq: Every day | ORAL | 3 refills | Status: AC

## 2024-02-05 MED ORDER — PREDNISONE 20 MG PO TABS: 40.0000 mg | ORAL_TABLET | Freq: Every day | ORAL | 0 refills | Status: AC

## 2024-02-05 MED ORDER — DAPAGLIFLOZIN PROPANEDIOL 10 MG PO TABS: 10.0000 mg | ORAL_TABLET | Freq: Every day | ORAL | 4 refills | Status: AC

## 2024-02-05 MED ORDER — IPRATROPIUM-ALBUTEROL 0.5-2.5 (3) MG/3ML IN SOLN
3.0000 mL | Freq: Once | RESPIRATORY_TRACT | Status: AC
  Administered 2024-02-05: 3 mL via RESPIRATORY_TRACT

## 2024-02-05 MED ORDER — ROSUVASTATIN CALCIUM 10 MG PO TABS: 10.0000 mg | ORAL_TABLET | Freq: Every day | ORAL | 3 refills | Status: AC

## 2024-02-05 MED ORDER — DOXYCYCLINE HYCLATE 100 MG PO TABS: 100.0000 mg | ORAL_TABLET | Freq: Two times a day (BID) | ORAL | 0 refills | Status: AC

## 2024-02-05 MED ORDER — LISINOPRIL 5 MG PO TABS: 5.0000 mg | ORAL_TABLET | Freq: Every day | ORAL | 3 refills | Status: AC

## 2024-02-05 MED ORDER — METFORMIN HCL 500 MG PO TABS: 1000.0000 mg | ORAL_TABLET | Freq: Two times a day (BID) | ORAL | 3 refills | Status: AC

## 2024-02-05 MED ORDER — SEMAGLUTIDE (2 MG/DOSE) 8 MG/3ML ~~LOC~~ SOPN: 2.0000 mg | PEN_INJECTOR | SUBCUTANEOUS | 4 refills | Status: DC

## 2024-02-05 NOTE — Patient Instructions (Addendum)
 Work on diet and exercise.  Need to get that A1c back down below 7.0.  Otherwise, will have to add more meds. We'll see each other back after the holidays to recheck A1c. No need to fast for that one. Alpha lipoic acid 600mg  daily for neuropathy Breztri  to replace symbicort . Use this TWICE daily and rinse mouth after each use Albuterol  every 6 hours for next 2 days then as needed after that Start prednisone  tomorrow Start doxycycline  today  Chronic Obstructive Pulmonary Disease  Chronic obstructive pulmonary disease (COPD) is a long-term (chronic) lung problem. When you have COPD, it can feel harder to breathe in or out. The condition may get worse over time. There are things you can do to keep yourself as healthy as possible. What are the causes? Smoking. This is the most common cause. Breathing in fumes, smoke, or chemicals for a long time. Genes that are inherited, which means they are passed down from parent to child. What are the signs or symptoms? Shortness of breath. This may happen all the time. This may get worse when you move your body. This may get worse over time. You may have times when this becomes much worse all of a sudden. These are called flare-ups or exacerbations. A long-term cough, with or without thick mucus. Wheezing. Chest tightness. Feeling tired. Not being able to do activities like you used to do. How is this diagnosed? This condition is diagnosed based on: Your medical history. A physical exam. Lung (pulmonary) function tests. You may have a test that measures the air flow out of the lungs when you breathe out. You may also have tests, including: Chest X-ray. CT scan. Blood tests. How is this treated? This condition may be treated by: Quitting smoking, if you smoke. Using oxygen. Taking medicines. These may include: Inhalers. These have medicines in them that you breathe in. Daily inhalers. These help to prevent symptoms from happening. They are  usually taken every day to prevent COPD flare-ups. Quick relief inhalers. These act fast to relieve symptoms. They are used only when needed and provide short-term relief. Other medicines that you breathe in or swallow. These may be used to open the airways, thin mucus, or treat infections. Breathing exercises to help you control or catch your breath. A mucus clearing device, if you have a lot of thick mucus. Pulmonary rehab. A place where you will learn about your condition and the best ways for you to manage it. Surgery. Follow these instructions at home: Medicines Take your medicines as told by your health care provider. Talk to your provider before taking any cough or allergy medicines. You may need to avoid medicines that cause your lungs to be dry. Lifestyle Several times a day, wash your hands with soap and water for at least 20 seconds. If you cannot use soap and water, use hand sanitizer. This may help keep you from getting an infection. Avoid being around crowds or people who are sick. Do not smoke or use any products that contain nicotine  or tobacco. If you need help quitting, ask your provider. Stay active. Learn how to pace your activity during the day. Learn how to breathe to control your stress and catch your breath. Drink enough fluid to keep your pee (urine) pale yellow, unless you have been told not to. Eat healthy foods. Eat smaller meals more often. Get enough sleep. Most adults need 7 or more hours per night. General instructions Make a COPD action plan with your provider. This  helps you to know what to do if you feel worse than usual. Make sure you get all the shots, also called vaccines, that your provider recommends. Ask your provider about a flu shot and a pneumonia shot. If you need home oxygen therapy, ask your provider how often to check your oxygen level with a device called an oximeter. Keep all follow-up visits to review your COPD action plan. Your provider  will want to check on your condition often to keep you healthy and out of the hospital. Contact a health care provider if: You are coughing up more mucus than usual. There is a change in the color or thickness of the mucus. It is harder to breathe than usual or you are short of breath while you are resting. You need to use your quick relief inhaler more often. You have trouble doing your normal activities such as getting dressed or walking in the house. Your skin color or fingernails turn blue. You have a fever or chills. Get help right away if: You are short of breath and cannot: Talk in full sentences. Do normal activities. You have chest pain. You feel confused. These symptoms may be an emergency. Call 911 right away. Do not wait to see if the symptoms will go away. Do not drive yourself to the hospital. This information is not intended to replace advice given to you by your health care provider. Make sure you discuss any questions you have with your health care provider. Document Revised: 11/29/2022 Document Reviewed: 05/14/2022 Elsevier Patient Education  2024 [employer].

## 2024-02-05 NOTE — Progress Notes (Signed)
 Scott [name] is a 68 y.o. male presents to office today for annual physical exam examination.     Type 2 Diabetes with hypertension, hyperlipidemia w/ overweight BMI:  Compliant with medications but admits he has not been compliant with diet.  He has been indulging in things like ice creams with his grandchildren.  He reports no hypoglycemic episodes.  No dysuria, hematuria or penile abnormalities.  Last eye exam: UTD Last foot exam: needs Last A1c:  Lab Results  Component Value Date   HGBA1C 7.2 (H) 02/03/2024   Nephropathy screen indicated?: UTD Last flu, zoster and/or pneumovax:  Immunization History  Administered Date(s) Administered   Fluad Quad(high Dose 65+) 01/10/2022   Influenza,inj,Quad PF,6+ Mos 12/29/2013, 12/30/2014, 02/14/2016, 01/16/2017, 01/24/2018, 03/09/2019   PFIZER(Purple Top)SARS-COV-2 Vaccination 10/23/2019   Pneumococcal Conjugate-13 08/22/2020   Pneumococcal Polysaccharide-23 03/12/2009, 03/17/2021   Tdap 12/30/2014   Zoster Recombinant(Shingrix ) 03/17/2021, 08/31/2021   Zoster, Live 02/22/2012    ROS: denies dizziness, LOC, polyuria, polydipsia, unintended weight loss/gain, foot ulcerations,  or chest pain.  He does report some plantar pain bilaterally that comes and goes and is most prominent during the morning time when he goes to step down.  He reports that it affects his entire plantar surface, not just the heel.  URI Reports URI symptoms that onset a few days ago.  He reports that started with sore throat but that has developed into a cough, wheezing and shortness of breath such that he utilized his Symbicort  twice yesterday rather than just the typical once per day.  He is a former smoker that quit 9 years ago and on last CT lung cancer screening he showed mild paraseptal and centrilobular emphysema.  He denies any hemoptysis.  Occupation: retired, Marital status: married, Substance use: former smoker Health Maintenance Due  Topic Date Due    Influenza Vaccine  10/11/2023   FOOT EXAM  10/24/2023   COVID-19 Vaccine (2 - 2025-26 season) 11/11/2023    Immunization History  Administered Date(s) Administered   Fluad Quad(high Dose 65+) 01/10/2022   Influenza,inj,Quad PF,6+ Mos 12/29/2013, 12/30/2014, 02/14/2016, 01/16/2017, 01/24/2018, 03/09/2019   PFIZER(Purple Top)SARS-COV-2 Vaccination 10/23/2019   Pneumococcal Conjugate-13 08/22/2020   Pneumococcal Polysaccharide-23 03/12/2009, 03/17/2021   Tdap 12/30/2014   Zoster Recombinant(Shingrix ) 03/17/2021, 08/31/2021   Zoster, Live 02/22/2012   Past Medical History:  Diagnosis Date   Allergy    Anxiety    Arthritis    Asbestosis (HCC)    Asthma    Diabetes mellitus without complication (HCC)    Dyslipidemia    GERD (gastroesophageal reflux disease)    Numbness    right side of tongue since 2016 surgery   Sleep apnea    uses cpap   Tobacco abuse    Social History   Socioeconomic History   Marital status: Married    Spouse name: Not on file   Number of children: 2   Years of education: Not on file   Highest education level: Associate degree: occupational, scientist, product/process development, or vocational program  Occupational History   Occupation: Ecologist: SELF EMPLOYED  Tobacco Use   Smoking status: Former    Current packs/day: 0.00    Types: Cigarettes    Quit date: 01/17/2015    Years since quitting: 9.0   Smokeless tobacco: Never   Tobacco comments:    He has patches.  Vaping Use   Vaping status: Never Used  Substance and Sexual Activity   Alcohol use: Not Currently    Comment:  social   Drug use: No   Sexual activity: Not on file  Other Topics Concern   Not on file  Social History Narrative   Lives at home with wife.    Social Drivers of Health   Financial Resource Strain: Medium Risk (12/11/2023)   Overall Financial Resource Strain (CARDIA)    Difficulty of Paying Living Expenses: Somewhat hard  Food Insecurity: No Food Insecurity (12/11/2023)   Hunger  Vital Sign    Worried About Running Out of Food in the Last Year: Never true    Ran Out of Food in the Last Year: Never true  Transportation Needs: No Transportation Needs (12/11/2023)   PRAPARE - Administrator, Civil Service (Medical): No    Lack of Transportation (Non-Medical): No  Physical Activity: Insufficiently Active (12/11/2023)   Exercise Vital Sign    Days of Exercise per Week: 2 days    Minutes of Exercise per Session: 30 min  Stress: No Stress Concern Present (12/11/2023)   [employer] of Occupational Health - Occupational Stress Questionnaire    Feeling of Stress: Only a little  Social Connections: Moderately Isolated (12/11/2023)   Social Connection and Isolation Panel    Frequency of Communication with Friends and Family: More than three times a week    Frequency of Social Gatherings with Friends and Family: More than three times a week    Attends Religious Services: Patient declined    Active Member of Clubs or Organizations: No    Attends Banker Meetings: Never    Marital Status: Married  Catering Manager Violence: Not At Risk (12/11/2023)   Humiliation, Afraid, Rape, and Kick questionnaire    Fear of Current or Ex-Partner: No    Emotionally Abused: No    Physically Abused: No    Sexually Abused: No   Past Surgical History:  Procedure Laterality Date   CHOLECYSTECTOMY N/A 01/10/2016   Procedure: LAPAROSCOPIC CHOLECYSTECTOMY;  Surgeon: Krystal Spinner, MD;  Location: [facility];  Service: General;  Laterality: N/A;   EYE SURGERY Bilateral    cataract surgery with lens implants   FOOT MASS EXCISION Right 35 yrs ago   KYPHOPLASTY N/A 10/10/2020   Procedure: Lumbar Two Kyphoplasty;  Surgeon: Colon Shove, MD;  Location: [facility];  Service: Neurosurgery;  Laterality: N/A;   right knuckle replaced  11/30/2014   saliva gland removed  04/2014   dr merilee gore   SPINE SURGERY     VASECTOMY     Family History  Problem Relation Age of Onset    Stroke Father        Died age 68   CAD Father    CAD Brother 38       CABG    Current Outpatient Medications:    albuterol  (VENTOLIN  HFA) 108 (90 Base) MCG/ACT inhaler, Inhale 2 puffs into the lungs every 6 (six) hours as needed for wheezing., Disp: 1 each, Rfl: 3   aspirin  81 MG tablet, Take 1 tablet (81 mg total) by mouth daily., Disp: 30 tablet, Rfl:    budesonide -formoterol  (SYMBICORT ) 160-4.5 MCG/ACT inhaler, Inhale 2 puffs into the lungs 2 (two) times daily., Disp: 30.6 g, Rfl: 12   clotrimazole -betamethasone  (LOTRISONE ) cream, Apply 1 Application topically 2 (two) times daily. To left shoulder, Disp: 30 g, Rfl: 0   cycloSPORINE  (RESTASIS ) 0.05 % ophthalmic emulsion, Place 1 drop into both eyes 2 (two) times daily. , Disp: , Rfl:    diclofenac  sodium (VOLTAREN ) 1 % GEL, Apply 4  g topically 4 (four) times daily., Disp: 350 g, Rfl: 1   linaclotide  (LINZESS ) 72 MCG capsule, Take 1 capsule (72 mcg total) by mouth daily before breakfast., Disp: , Rfl:    loratadine (CLARITIN) 10 MG tablet, Take 10 mg by mouth daily as needed for allergies., Disp: , Rfl:    Multiple Vitamins-Minerals (CENTRUM SILVER 50+MEN PO), Take by mouth., Disp: , Rfl:    naproxen  sodium (ALEVE ) 220 MG tablet, Take 440 mg by mouth 2 (two) times daily as needed (pain)., Disp: , Rfl:    dapagliflozin  propanediol (FARXIGA ) 10 MG TABS tablet, Take 1 tablet (10 mg total) by mouth daily before breakfast., Disp: 90 tablet, Rfl: 4   lisinopril  (ZESTRIL ) 5 MG tablet, Take 1 tablet (5 mg total) by mouth daily., Disp: 90 tablet, Rfl: 3   metFORMIN  (GLUCOPHAGE ) 500 MG tablet, Take 2 tablets (1,000 mg total) by mouth 2 (two) times daily with a meal., Disp: 360 tablet, Rfl: 3   omeprazole  (PRILOSEC) 40 MG capsule, Take 1 capsule (40 mg total) by mouth daily., Disp: 90 capsule, Rfl: 3   rosuvastatin  (CRESTOR ) 10 MG tablet, Take 1 tablet (10 mg total) by mouth daily., Disp: 90 tablet, Rfl: 3   Semaglutide , 2 MG/DOSE, 8 MG/3ML SOPN, Inject  2 mg as directed every 7 (seven) days. Via [employer] patient assistance program, Disp: 6 mL, Rfl: 4   Vitamin D , Ergocalciferol , (DRISDOL ) 1.25 MG (50000 UNIT) CAPS capsule, Take 1 capsule (50,000 Units total) by mouth every 7 (seven) days. X12 weeks then take OTC Vit D 800IU daily. (Patient not taking: Reported on 02/05/2024), Disp: 12 capsule, Rfl: 0  Allergies  Allergen Reactions   Augmentin [Amoxicillin-Pot Clavulanate] Itching and Nausea And Vomiting   Codeine Itching and Nausea And Vomiting   Penicillins Hives and Itching    Has patient had a PCN reaction causing immediate rash, facial/tongue/throat swelling, SOB or lightheadedness with hypotension: No Has patient had a PCN reaction causing severe rash involving mucus membranes or skin necrosis: No Has patient had a PCN reaction that required hospitalization No Has patient had a PCN reaction occurring within the last 10 years: No If all of the above answers are NO, then may proceed with Cephalosporin use.    Atorvastatin      Myalgias and fatigue   Oxycodone Hcl Hives, Itching and Other (See Comments)     ROS: Review of Systems Pertinent items noted in HPI and remainder of comprehensive ROS otherwise negative.    Physical exam BP 119/73   Pulse 96   Temp 97.6 F (36.4 C)   Ht 5' 11 (1.803 m)   Wt 205 lb (93 kg)   SpO2 96%   BMI 28.59 kg/m  General appearance: alert, cooperative, appears stated age, and no distress Head: Normocephalic, without obvious abnormality, atraumatic Eyes: negative findings: lids and lashes normal, conjunctivae and sclerae normal, corneas clear, and pupils equal, round, reactive to light and accomodation Ears: normal TM's and external ear canals both ears Nose: Nares normal. Septum midline. Mucosa normal. No drainage or sinus tenderness. Throat: lips, mucosa, and tongue normal; teeth and gums normal Neck: no adenopathy, supple, symmetrical, trachea midline, and thyroid not enlarged, symmetric,  no tenderness/mass/nodules Back: symmetric, no curvature. ROM normal. No CVA tenderness. Lungs: Global expiratory wheezes noted both posterior lately and anteriorly.  No coughing or dyspnea observed Chest wall: no tenderness Heart: regular rate and rhythm, S1, S2 normal, no murmur, click, rub or gallop Abdomen: soft, non-tender; bowel sounds normal; no  masses,  no organomegaly Extremities: extremities normal, atraumatic, no cyanosis or edema Pulses: 2+ and symmetric Skin: Some healing senile purpura noted throughout the upper extremities Lymph nodes: No supraclavicular or anterior cervical lymphadenopathy Neurologic: Alert and oriented X 3, normal strength and tone. Normal symmetric reflexes. Normal coordination and gait   Diabetic Foot Exam - Simple   Simple Foot Form Diabetic Foot exam was performed with the following findings: Yes 02/05/2024 10:25 AM  Visual Inspection No deformities, no ulcerations, no other skin breakdown bilaterally: Yes Sensation Testing Intact to touch and monofilament testing bilaterally: Yes Pulse Check Posterior Tibialis and Dorsalis pulse intact bilaterally: Yes Comments        02/05/2024   10:26 AM 12/11/2023   11:34 AM 08/23/2023    9:25 AM  Depression screen PHQ 2/9  Decreased Interest 0 0 0  Down, Depressed, Hopeless 0 0 0  PHQ - 2 Score 0 0 0  Altered sleeping 0  0  Tired, decreased energy 0  0  Change in appetite 0  0  Feeling bad or failure about yourself  0  0  Trouble concentrating 0  0  Moving slowly or fidgety/restless 0  0  Suicidal thoughts 0  0  PHQ-9 Score 0  0   Difficult doing work/chores Not difficult at all  Not difficult at all     Data saved with a previous flowsheet row definition      02/05/2024   10:27 AM 08/23/2023    9:25 AM 04/26/2023    1:30 PM 10/24/2022    9:28 AM  GAD 7 : Generalized Anxiety Score  Nervous, Anxious, on Edge 0 0 0 0  Control/stop worrying 0 0 0 0  Worry too much - different things 0 0 0 0   Trouble relaxing 0 0 0 0  Restless 0 0 0 0  Easily annoyed or irritable 0 0 0 0  Afraid - awful might happen 0 0 0 0  Total GAD 7 Score 0 0 0 0  Anxiety Difficulty Not difficult at all  Not difficult at all Not difficult at all    Recent Results (from the past 2160 hours)  Bayer DCA Hb A1c Waived     Status: Abnormal   Collection Time: 02/03/24  9:24 AM  Result Value Ref Range   HB A1C (BAYER DCA - WAIVED) 7.2 (H) 4.8 - 5.6 %    Comment:          Prediabetes: 5.7 - 6.4          Diabetes: >6.4          Glycemic control for adults with diabetes: <7.0   PSA     Status: None   Collection Time: 02/03/24  9:26 AM  Result Value Ref Range   Prostate Specific Ag, Serum 0.5 0.0 - 4.0 ng/mL    Comment: Roche ECLIA methodology. According to the American Urological Association, Serum PSA should decrease and remain at undetectable levels after radical prostatectomy. The AUA defines biochemical recurrence as an initial PSA value 0.2 ng/mL or greater followed by a subsequent confirmatory PSA value 0.2 ng/mL or greater. Values obtained with different assay methods or kits cannot be used interchangeably. Results cannot be interpreted as absolute evidence of the presence or absence of malignant disease.   Vitamin D , 25-hydroxy     Status: Abnormal   Collection Time: 02/03/24  9:26 AM  Result Value Ref Range   Vit D, 25-Hydroxy 27.2 (L) 30.0 - 100.0 ng/mL  Comment: Vitamin D  deficiency has been defined by the Institute of Medicine and an Endocrine Society practice guideline as a level of serum 25-OH vitamin D  less than 20 ng/mL (1,2). The Endocrine Society went on to further define vitamin D  insufficiency as a level between 21 and 29 ng/mL (2). 1. IOM (Institute of Medicine). 2010. Dietary reference    intakes for calcium  and D. [place]: The    [employer]. 2. Holick MF, Binkley [place], Bischoff-Ferrari HA, et al.    Evaluation, treatment, and prevention of vitamin D      deficiency: an Endocrine Society clinical practice    guideline. JCEM. 2011 Jul; 96(7):1911-30.   TSH     Status: None   Collection Time: 02/03/24  9:26 AM  Result Value Ref Range   TSH 1.510 0.450 - 4.500 uIU/mL  CMP14+EGFR     Status: Abnormal   Collection Time: 02/03/24  9:26 AM  Result Value Ref Range   Glucose 144 (H) 70 - 99 mg/dL   BUN 10 8 - 27 mg/dL   Creatinine, Ser 9.16 0.76 - 1.27 mg/dL   eGFR 95 >40 fO/fpw/8.26   BUN/Creatinine Ratio 12 10 - 24   Sodium 138 134 - 144 mmol/L   Potassium 4.3 3.5 - 5.2 mmol/L   Chloride 100 96 - 106 mmol/L   CO2 24 20 - 29 mmol/L   Calcium  9.1 8.6 - 10.2 mg/dL   Total Protein 6.6 6.0 - 8.5 g/dL   Albumin 4.4 3.9 - 4.9 g/dL   Globulin, Total 2.2 1.5 - 4.5 g/dL   Bilirubin Total 0.8 0.0 - 1.2 mg/dL   Alkaline Phosphatase 54 47 - 123 IU/L   AST 18 0 - 40 IU/L   ALT 22 0 - 44 IU/L  Lipid Panel     Status: Abnormal   Collection Time: 02/03/24  9:26 AM  Result Value Ref Range   Cholesterol, Total 96 (L) 100 - 199 mg/dL   Triglycerides 81 0 - 149 mg/dL   HDL 45 >60 mg/dL   VLDL Cholesterol Cal 17 5 - 40 mg/dL   LDL Chol Calc (NIH) 34 0 - 99 mg/dL   Chol/HDL Ratio 2.1 0.0 - 5.0 ratio    Comment:                                   T. Chol/HDL Ratio                                             Men  Women                               1/2 Avg.Risk  3.4    3.3                                   Avg.Risk  5.0    4.4                                2X Avg.Risk  9.6    7.1  3X Avg.Risk 23.4   11.0   CBC with Differential     Status: None   Collection Time: 02/03/24  9:26 AM  Result Value Ref Range   WBC 7.1 3.4 - 10.8 x10E3/uL   RBC 4.66 4.14 - 5.80 x10E6/uL   Hemoglobin 14.6 13.0 - 17.7 g/dL   Hematocrit 56.4 62.4 - 51.0 %   MCV 93 79 - 97 fL   MCH 31.3 26.6 - 33.0 pg   MCHC 33.6 31.5 - 35.7 g/dL   RDW 87.2 88.3 - 84.5 %   Platelets 166 150 - 450 x10E3/uL   Neutrophils 62 Not Estab. %   Lymphs 23 Not  Estab. %   Monocytes 8 Not Estab. %   Eos 6 Not Estab. %   Basos 1 Not Estab. %   Neutrophils Absolute 4.5 1.4 - 7.0 x10E3/uL   Lymphocytes Absolute 1.6 0.7 - 3.1 x10E3/uL   Monocytes Absolute 0.6 0.1 - 0.9 x10E3/uL   EOS (ABSOLUTE) 0.4 0.0 - 0.4 x10E3/uL   Basophils Absolute 0.1 0.0 - 0.2 x10E3/uL   Immature Granulocytes 0 Not Estab. %   Immature Grans (Abs) 0.0 0.0 - 0.1 x10E3/uL     Assessment/ Plan: [name] here for annual physical exam.   Annual physical exam  COPD exacerbation (HCC) - Plan: Veritor SARS-CoV-2 and Flu A+B, ipratropium-albuterol  (DUONEB) 0.5-2.5 (3) MG/3ML nebulizer solution 3 mL, predniSONE  (DELTASONE ) 20 MG tablet, doxycycline  (VIBRA -TABS) 100 MG tablet, benzonatate  (TESSALON ) 200 MG capsule  Centrilobular emphysema (HCC) - Plan: budesonide -glycopyrrolate-formoterol  (BREZTRI  AEROSPHERE) 160-9-4.8 MCG/ACT AERO inhaler, benzonatate  (TESSALON ) 200 MG capsule  Paraseptal emphysema (HCC) - Plan: budesonide -glycopyrrolate-formoterol  (BREZTRI  AEROSPHERE) 160-9-4.8 MCG/ACT AERO inhaler, benzonatate  (TESSALON ) 200 MG capsule  Diabetes mellitus treated with injections of non-insulin medication (HCC) - Plan: dapagliflozin  propanediol (FARXIGA ) 10 MG TABS tablet, metFORMIN  (GLUCOPHAGE ) 500 MG tablet, Semaglutide , 2 MG/DOSE, 8 MG/3ML SOPN  Hyperlipidemia associated with type 2 diabetes mellitus (HCC) - Plan: rosuvastatin  (CRESTOR ) 10 MG tablet  Hypertension associated with diabetes (HCC) - Plan: lisinopril  (ZESTRIL ) 5 MG tablet  Vitamin D  deficiency  Benign prostatic hyperplasia without lower urinary tract symptoms  Gastroesophageal reflux disease without esophagitis - Plan: omeprazole  (PRILOSEC) 40 MG capsule   Presentation today consistent with COPD exacerbation.  We discussed the benefit of triple therapy in reducing exacerbations and I have sent over a breast tree to replace Symbicort .  Discussed rinsing mouth after each use.  May need to consider applying  for patient assistance if cannot afford.  Advised to use albuterol  inhaler every 6 hours for the next 2 days and as needed as directed.  Will CC Dr. Theophilus as RICK.  He was given a corticosteroid injection as well as prednisone  burst and I advised him to watch diet very closely because his sugar will certainly go up over the next several days.  Doxycycline  sent to cover for infection.  Discussed taking food with this so as to reduce nausea.  DuoNeb provided and had some subjective improvement.  COVID, flu negative here  Sugar is not at goal with A1c up to 7.2.  Reinforced need for compliance with diet and exercise along with meds.  He is on max dose of all meds and so we will need to consider adding something like glipizide if he is needing supplemental medication.  Though I think that he can get this under control with diet modifications alone  Continue statin, blood pressure medication  Vitamin D  deficient but vitamin D  has been sent to start.  He will  take for 12 weeks and then transition over to OTC  PSA was normal.  BPH is stable and he is asymptomatic  PPI renewed.  Counseled on healthy lifestyle choices, including diet (rich in fruits, vegetables and lean meats and low in salt and simple carbohydrates) and exercise (at least 30 minutes of moderate physical activity daily).  Patient to follow up 10m for COPD/ DM  Agape Hardiman M. Jolinda, DO

## 2024-02-05 NOTE — Addendum Note (Signed)
 Addended by: SHERRE SUZEN PARAS on: 02/05/2024 11:03 AM   Modules accepted: Orders

## 2024-02-10 ENCOUNTER — Other Ambulatory Visit: Payer: Self-pay | Admitting: Family Medicine

## 2024-02-10 DIAGNOSIS — E559 Vitamin D deficiency, unspecified: Secondary | ICD-10-CM

## 2024-02-24 ENCOUNTER — Ambulatory Visit: Payer: Self-pay | Admitting: Family Medicine

## 2024-04-13 ENCOUNTER — Telehealth: Payer: Self-pay

## 2024-04-13 ENCOUNTER — Encounter: Payer: Self-pay | Admitting: Family Medicine

## 2024-04-13 DIAGNOSIS — Z7985 Long-term (current) use of injectable non-insulin antidiabetic drugs: Secondary | ICD-10-CM

## 2024-04-13 DIAGNOSIS — E119 Type 2 diabetes mellitus without complications: Secondary | ICD-10-CM

## 2024-04-13 MED ORDER — SEMAGLUTIDE (2 MG/DOSE) 8 MG/3ML ~~LOC~~ SOPN: 2.0000 mg | PEN_INJECTOR | SUBCUTANEOUS | 0 refills | Status: DC

## 2024-04-13 NOTE — Telephone Encounter (Signed)
 Copied from CRM [id number]. Topic: Clinical - Medication Refill >> Apr 13, 2024 10:58 AM Ivette P wrote: Medication: Semaglutide , 2 MG/DOSE, 8 MG/3ML SOPN  Has the patient contacted their pharmacy? Yes (Agent: If no, request that the patient contact the pharmacy for the refill. If patient does not wish to contact the pharmacy document the reason why and proceed with request.) (Agent: If yes, when and what did the pharmacy advise?)  This is the patient's preferred pharmacy:  [facility], [place] - [address] [place] [address] Phone: [phone] Fax: [phone]  Is this the correct pharmacy for this prescription? Yes If no, delete pharmacy and type the correct one.   Has the prescription been filled recently? No  Is the patient out of the medication? Yes, about to be out  Has the patient been seen for an appointment in the last year OR does the patient have an upcoming appointment? Yes  Can we respond through MyChart? Yes  Agent: Please be advised that Rx refills may take up to 3 business days. We ask that you follow-up with your pharmacy.

## 2024-04-14 MED ORDER — SEMAGLUTIDE (2 MG/DOSE) 8 MG/3ML ~~LOC~~ SOPN: 2.0000 mg | PEN_INJECTOR | SUBCUTANEOUS | 0 refills | Status: AC

## 2024-05-12 ENCOUNTER — Ambulatory Visit: Admitting: Family Medicine

## 2024-12-11 ENCOUNTER — Ambulatory Visit: Payer: Self-pay

## 2025-02-09 ENCOUNTER — Encounter: Admitting: Family Medicine
